# Patient Record
Sex: Female | Born: 1950
Health system: Southern US, Community
[De-identification: ages and names within clinical notes are randomized; demographics above are authoritative.]

## PROBLEM LIST (undated history)

## (undated) DIAGNOSIS — W11XXXA Fall on and from ladder, initial encounter: Secondary | ICD-10-CM

## (undated) DIAGNOSIS — M1990 Unspecified osteoarthritis, unspecified site: Secondary | ICD-10-CM

## (undated) DIAGNOSIS — M4184 Other forms of scoliosis, thoracic region: Secondary | ICD-10-CM

## (undated) DIAGNOSIS — I10 Essential (primary) hypertension: Secondary | ICD-10-CM

## (undated) DIAGNOSIS — C801 Malignant (primary) neoplasm, unspecified: Secondary | ICD-10-CM

## (undated) DIAGNOSIS — R112 Nausea with vomiting, unspecified: Secondary | ICD-10-CM

## (undated) DIAGNOSIS — E785 Hyperlipidemia, unspecified: Secondary | ICD-10-CM

## (undated) DIAGNOSIS — N189 Chronic kidney disease, unspecified: Secondary | ICD-10-CM

## (undated) DIAGNOSIS — G4700 Insomnia, unspecified: Secondary | ICD-10-CM

## (undated) DIAGNOSIS — K5792 Diverticulitis of intestine, part unspecified, without perforation or abscess without bleeding: Secondary | ICD-10-CM

## (undated) DIAGNOSIS — F419 Anxiety disorder, unspecified: Secondary | ICD-10-CM

## (undated) DIAGNOSIS — K5790 Diverticulosis of intestine, part unspecified, without perforation or abscess without bleeding: Secondary | ICD-10-CM

## (undated) DIAGNOSIS — K5900 Constipation, unspecified: Secondary | ICD-10-CM

## (undated) DIAGNOSIS — M4154 Other secondary scoliosis, thoracic region: Secondary | ICD-10-CM

## (undated) DIAGNOSIS — Z87442 Personal history of urinary calculi: Secondary | ICD-10-CM

## (undated) DIAGNOSIS — E039 Hypothyroidism, unspecified: Secondary | ICD-10-CM

## (undated) DIAGNOSIS — Z9889 Other specified postprocedural states: Secondary | ICD-10-CM

## (undated) DIAGNOSIS — M8580 Other specified disorders of bone density and structure, unspecified site: Secondary | ICD-10-CM

## (undated) DIAGNOSIS — E7800 Pure hypercholesterolemia, unspecified: Secondary | ICD-10-CM

## (undated) HISTORY — PX: ELBOW FRACTURE SURGERY: SHX616

## (undated) HISTORY — DX: Unspecified osteoarthritis, unspecified site: M19.90

## (undated) HISTORY — PX: TUBAL LIGATION: SHX77

## (undated) HISTORY — PX: ABDOMINAL HYSTERECTOMY: SHX81

## (undated) HISTORY — PX: APPENDECTOMY: SHX54

## (undated) HISTORY — PX: OTHER SURGICAL HISTORY: SHX169

## (undated) HISTORY — PX: KNEE SURGERY: SHX244

## (undated) HISTORY — PX: FRACTURE SURGERY: SHX138

## (undated) HISTORY — DX: Hyperlipidemia, unspecified: E78.5

## (undated) HISTORY — PX: TONSILLECTOMY: SUR1361

## (undated) HISTORY — PX: EYE SURGERY: SHX253

---

## 1985-09-21 DIAGNOSIS — Z87442 Personal history of urinary calculi: Secondary | ICD-10-CM

## 1985-09-21 HISTORY — DX: Personal history of urinary calculi: Z87.442

## 1990-09-21 HISTORY — PX: CHOLECYSTECTOMY: SHX55

## 1995-09-22 HISTORY — PX: BILATERAL SALPINGECTOMY: SHX5743

## 2000-10-05 ENCOUNTER — Encounter: Payer: Self-pay | Admitting: Emergency Medicine

## 2000-10-05 ENCOUNTER — Emergency Department (HOSPITAL_COMMUNITY): Admission: EM | Admit: 2000-10-05 | Discharge: 2000-10-05 | Payer: Self-pay | Admitting: Emergency Medicine

## 2001-04-01 ENCOUNTER — Encounter: Admission: RE | Admit: 2001-04-01 | Discharge: 2001-04-01 | Payer: Self-pay

## 2001-04-06 ENCOUNTER — Emergency Department (HOSPITAL_COMMUNITY): Admission: EM | Admit: 2001-04-06 | Discharge: 2001-04-06 | Payer: Self-pay | Admitting: Emergency Medicine

## 2001-04-06 ENCOUNTER — Encounter: Payer: Self-pay | Admitting: Surgery

## 2001-04-11 ENCOUNTER — Encounter: Payer: Self-pay | Admitting: Surgery

## 2001-04-11 ENCOUNTER — Encounter: Admission: RE | Admit: 2001-04-11 | Discharge: 2001-04-11 | Payer: Self-pay | Admitting: Surgery

## 2001-12-29 ENCOUNTER — Ambulatory Visit (HOSPITAL_COMMUNITY): Admission: RE | Admit: 2001-12-29 | Discharge: 2001-12-29 | Payer: Self-pay | Admitting: Gastroenterology

## 2001-12-29 ENCOUNTER — Encounter (INDEPENDENT_AMBULATORY_CARE_PROVIDER_SITE_OTHER): Payer: Self-pay | Admitting: Specialist

## 2003-09-22 DIAGNOSIS — W11XXXA Fall on and from ladder, initial encounter: Secondary | ICD-10-CM

## 2003-09-22 HISTORY — PX: ELBOW FRACTURE SURGERY: SHX616

## 2003-09-22 HISTORY — DX: Fall on and from ladder, initial encounter: W11.XXXA

## 2004-12-23 ENCOUNTER — Ambulatory Visit: Payer: Self-pay

## 2005-02-16 ENCOUNTER — Ambulatory Visit: Payer: Self-pay | Admitting: Unknown Physician Specialty

## 2005-03-20 ENCOUNTER — Encounter: Admission: RE | Admit: 2005-03-20 | Discharge: 2005-03-20 | Payer: Self-pay | Admitting: Gastroenterology

## 2006-06-18 ENCOUNTER — Emergency Department (HOSPITAL_COMMUNITY): Admission: EM | Admit: 2006-06-18 | Discharge: 2006-06-18 | Payer: Self-pay | Admitting: Emergency Medicine

## 2007-01-21 ENCOUNTER — Encounter: Admission: RE | Admit: 2007-01-21 | Discharge: 2007-01-21 | Payer: Self-pay | Admitting: Gastroenterology

## 2007-04-26 ENCOUNTER — Emergency Department (HOSPITAL_COMMUNITY): Admission: EM | Admit: 2007-04-26 | Discharge: 2007-04-26 | Payer: Self-pay | Admitting: Emergency Medicine

## 2010-01-03 ENCOUNTER — Observation Stay (HOSPITAL_COMMUNITY): Admission: EM | Admit: 2010-01-03 | Discharge: 2010-01-05 | Payer: Self-pay | Admitting: Emergency Medicine

## 2010-10-12 ENCOUNTER — Encounter: Payer: Self-pay | Admitting: Gastroenterology

## 2010-12-09 LAB — CBC
HCT: 39.3 % (ref 36.0–46.0)
Hemoglobin: 13.6 g/dL (ref 12.0–15.0)
MCHC: 35.4 g/dL (ref 30.0–36.0)
MCV: 92.1 fL (ref 78.0–100.0)
Platelets: 252 10*3/uL (ref 150–400)
RBC: 4.15 MIL/uL (ref 3.87–5.11)
RDW: 11.7 % (ref 11.5–15.5)
WBC: 8.4 10*3/uL (ref 4.0–10.5)

## 2010-12-09 LAB — BASIC METABOLIC PANEL
CO2: 25 mEq/L (ref 19–32)
Chloride: 109 mEq/L (ref 96–112)
Glucose, Bld: 116 mg/dL — ABNORMAL HIGH (ref 70–99)
Sodium: 140 mEq/L (ref 135–145)

## 2010-12-09 LAB — SAMPLE TO BLOOD BANK

## 2010-12-09 LAB — DIFFERENTIAL
Basophils Absolute: 0 10*3/uL (ref 0.0–0.1)
Basophils Relative: 0 % (ref 0–1)
Eosinophils Absolute: 0 10*3/uL (ref 0.0–0.7)
Eosinophils Relative: 0 % (ref 0–5)
Lymphs Abs: 2.2 10*3/uL (ref 0.7–4.0)
Neutrophils Relative %: 66 % (ref 43–77)

## 2010-12-09 LAB — COMPREHENSIVE METABOLIC PANEL
ALT: 38 U/L — ABNORMAL HIGH (ref 0–35)
Albumin: 4 g/dL (ref 3.5–5.2)
Alkaline Phosphatase: 55 U/L (ref 39–117)
Chloride: 105 mEq/L (ref 96–112)
Glucose, Bld: 147 mg/dL — ABNORMAL HIGH (ref 70–99)
Potassium: 3.3 mEq/L — ABNORMAL LOW (ref 3.5–5.1)
Sodium: 138 mEq/L (ref 135–145)
Total Bilirubin: 0.9 mg/dL (ref 0.3–1.2)
Total Protein: 6.4 g/dL (ref 6.0–8.3)

## 2010-12-09 LAB — POCT I-STAT, CHEM 8
Creatinine, Ser: 0.4 mg/dL (ref 0.4–1.2)
HCT: 41 % (ref 36.0–46.0)
Hemoglobin: 13.9 g/dL (ref 12.0–15.0)
Potassium: 3.4 mEq/L — ABNORMAL LOW (ref 3.5–5.1)
Sodium: 137 mEq/L (ref 135–145)
TCO2: 23 mmol/L (ref 0–100)

## 2010-12-09 LAB — TSH: TSH: 5.997 u[IU]/mL — ABNORMAL HIGH (ref 0.350–4.500)

## 2010-12-09 LAB — HEPATIC FUNCTION PANEL
Albumin: 4.4 g/dL (ref 3.5–5.2)
Indirect Bilirubin: 0.7 mg/dL (ref 0.3–0.9)
Total Protein: 6.8 g/dL (ref 6.0–8.3)

## 2010-12-09 LAB — APTT: aPTT: 28 seconds (ref 24–37)

## 2010-12-09 LAB — PROTIME-INR
INR: 1.05 (ref 0.00–1.49)
Prothrombin Time: 13.6 seconds (ref 11.6–15.2)

## 2011-02-06 NOTE — H&P (Signed)
[facility]  Patient:    Gloria Bell, Gloria Bell                      MRN: [id number] Adm. Date:  [id number] Disc. Date: [id number] Attending:  Tobey Bride                         History and Physical  ACCOUNT NO. [id number].  CHIEF COMPLAINT:  Abdominal pain.  HISTORY OF PRESENT ILLNESS:  This patient is a 60 year old woman who presented to the emergency room after her primary physician, Dr. Fayrene Fearing in [place], had spoken with Dr. Maple Hudson at our office.  The patient has had a chief complaint of abdominal pain, which has been going on now about two months, although it is gradually getting worse and progressive and has been very steady and constant.  It is located epigastrically right at the subxiphoid, goes around to the right upper quadrant and around to her back.  She has had occasional minimal nausea but has had none significant today.  She has had no vomiting, fever, or chills. She has had no change in her bowel habits.  She has thrown up no blood and has had no blood in her stools.  Her weight has been stable, and she has had no other recent GI symptoms.  Approximately 10 years ago she had a laparoscopic cholecystectomy done when she lived in [place].  According to her, this was done because her gallbladder was not functioning and not because she had stones.  She was not having symptoms at that time that she remembers that were similar to this.  She has done well since that surgery was done regarding her GI tract.  She was placed on some Prevacid by Dr. Fayrene Fearing and Carafate but really has not noticed much improvement.  PAST MEDICAL HISTORY:  The patient says she is otherwise in generally good health.  ALLERGIES:  She is sensitive to CODEINE, ASPIRIN, TETRACYCLINE, BIAXIN, and SULFA.  PAST SURGICAL HISTORY:  Hysterectomy and some ovarian surgeries, tubal ligation, and the cholecystectomy.  SOCIAL HISTORY:  She does not smoke nor drink.  REVIEW OF  SYSTEMS:  Essentially negative, noncontributory to the current illness.  PHYSICAL EXAMINATION:  VITAL SIGNS:  Unremarkable and noted on the emergency room sheet, not redictated here.  GENERAL:  The patient is healthy and in no acute distress.  HEENT:  Head is normocephalic.  Eyes nonicteric.  Pupils round and regular.  NECK:  Supple.  No masses or thyromegaly.  CHEST:  Lungs clear to auscultation.  CARDIAC:  Regular with no murmurs, rubs, or gallops.  ABDOMEN:  Soft and not distended, not particularly tender.  No rebound, no guarding.  PELVIC, RECTAL:  Not done.  EXTREMITIES:  No cyanosis or edema.  LABORATORY DATA:  White count of 7100, hemoglobin 13.  CMET is completely normal.  Amylase and lipase are both normal.  IMPRESSION:  A two-month history of gradually increasing epigastric right upper quadrant pain of uncertain etiology.  PLAN:  I think this needs further workup, but she does not have an acute illness that I think will necessitate hospitalization.  The next diagnostic studies should include, I think, a CT scan to rule out some occult pancreatic process, although her ultrasound last week was unremarkable, as well as perhaps either an upper GI or an upper endoscopy.  We will discuss this with her and make further plans. DD:  04/06/01 TD:  04/07/01  Job: [id number]

## 2011-02-06 NOTE — Op Note (Signed)
[facility]  Patient:    Gloria Bell, SPAGNOLO Visit Number: [id number] MRN: [id number]          Service Type: END Location: ENDO Attending Physician:  Orland Mustard Dictated by:   Llana Aliment. Randa Evens, M.D. Proc. Date: 12/29/01 Admit Date:  12/29/2001   CC:         Kizzie Furnish, M.D.  Currie Paris, M.D.   Operative Report  DATE OF BIRTH:  1950/09/25  PROCEDURE PERFORMED:  Colonoscopy and polypectomy.  ENDOSCOPIST:  Llana Aliment. Randa Evens, M.D.  MEDICATIONS USED:  Fentanyl 100 mcg, Versed 10 mg IV.  INSTRUMENT:  Olympus pediatric colonoscope.  INDICATIONS:  Change in bowel habits, left lower quadrant pain, change in stool caliber in a woman with a strong family history of colon polyps.  DESCRIPTION OF PROCEDURE:  The procedure had been explained to the patient and consent obtained.  With the patient in the left lateral decubitus position, the Olympus pediatric colonoscope was inserted and advanced under direct visualization.  The prep was excellent and we were able to advance to the cecum without difficulty.  The ileocecal valve and appendiceal orifice were seen.  The scope was withdrawn.  The cecum, ascending colon, hepatic flexure, transverse colon, splenic flexure, descending colon were seen.  A 4 to 5 mm sessile polyp was encountered in the descending colon and removed with a snare and sucked through the scope.  We felt that we recovered it but upon sure it wasnt sure if this was a polyp or fragments of stool.  No other polyps were seen throughout the entire colon.  The sigmoid colon revealed no polyps and no evidence of diverticular disease.  Scope withdrawn down to the rectum.  The rectum was free of polyps.  ASSESSMENT: 1. Descending colon polyp removed but lost.  Due to her family history, I    think we will have to assume that this is adenomatous if the fragment have    proved not to be polyp. 2. Left lower quadrant  pain without any obvious mechanical abnormality.  PLAN:  Will recommend a three-year repeat colonoscopy.  Will continue the patient on Miralax and see back in the office in six to eight weeks. Dictated by:   Llana Aliment. Randa Evens, M.D. Attending Physician:  Orland Mustard DD:  12/29/01 TD:  12/30/01 Job: [id number]

## 2011-07-06 LAB — COMPREHENSIVE METABOLIC PANEL
BUN: 12
CO2: 27
Calcium: 9.4
Creatinine, Ser: 0.65
GFR calc non Af Amer: >60
Glucose, Bld: 125 — ABNORMAL HIGH

## 2011-07-06 LAB — DIFFERENTIAL
Basophils Absolute: 0
Eosinophils Relative: 0
Lymphocytes Relative: 33
Neutro Abs: 3.5
Neutrophils Relative %: 60

## 2011-07-06 LAB — D-DIMER, QUANTITATIVE

## 2011-07-06 LAB — CBC
HCT: 38.5
MCHC: 34.8
MCV: 89.6
RBC: 4.3
WBC: 5.8

## 2011-07-06 LAB — POCT CARDIAC MARKERS
CKMB, poc: <1 — ABNORMAL LOW
Myoglobin, poc: 31.1
Operator id: 4661

## 2012-01-05 ENCOUNTER — Inpatient Hospital Stay (HOSPITAL_COMMUNITY)
Admission: EM | Admit: 2012-01-05 | Discharge: 2012-01-08 | DRG: 551 | Disposition: A | Discharge status: Home / Self Care | Payer: BC Managed Care – PPO | Source: Ambulatory Visit | Attending: Internal Medicine | Admitting: Internal Medicine

## 2012-01-05 ENCOUNTER — Emergency Department (HOSPITAL_COMMUNITY): Payer: BC Managed Care – PPO

## 2012-01-05 ENCOUNTER — Encounter (HOSPITAL_COMMUNITY): Payer: Self-pay

## 2012-01-05 DIAGNOSIS — N390 Urinary tract infection, site not specified: Secondary | ICD-10-CM | POA: Diagnosis present

## 2012-01-05 DIAGNOSIS — K651 Peritoneal abscess: Secondary | ICD-10-CM | POA: Diagnosis present

## 2012-01-05 DIAGNOSIS — K5732 Diverticulitis of large intestine without perforation or abscess without bleeding: Principal | ICD-10-CM | POA: Diagnosis present

## 2012-01-05 DIAGNOSIS — K5792 Diverticulitis of intestine, part unspecified, without perforation or abscess without bleeding: Secondary | ICD-10-CM | POA: Diagnosis present

## 2012-01-05 DIAGNOSIS — I10 Essential (primary) hypertension: Secondary | ICD-10-CM | POA: Diagnosis present

## 2012-01-05 HISTORY — DX: Essential (primary) hypertension: I10

## 2012-01-05 LAB — URINALYSIS, ROUTINE W REFLEX MICROSCOPIC
Glucose, UA: NEGATIVE mg/dL
Nitrite: NEGATIVE
Protein, ur: NEGATIVE mg/dL
Urobilinogen, UA: 1 mg/dL (ref 0.0–1.0)

## 2012-01-05 LAB — COMPREHENSIVE METABOLIC PANEL
AST: 17 U/L (ref 0–37)
CO2: 27 mEq/L (ref 19–32)
Calcium: 9.3 mg/dL (ref 8.4–10.5)
Creatinine, Ser: 0.68 mg/dL (ref 0.50–1.10)
GFR calc Af Amer: >90 mL/min (ref >90)
GFR calc non Af Amer: >90 mL/min (ref >90)

## 2012-01-05 LAB — CBC
HCT: 38.9 % (ref 36.0–46.0)
MCH: 31.3 pg (ref 26.0–34.0)
MCHC: 34.7 g/dL (ref 30.0–36.0)
MCV: 90.3 fL (ref 78.0–100.0)
RDW: 11.9 % (ref 11.5–15.5)
WBC: 10 10*3/uL (ref 4.0–10.5)

## 2012-01-05 LAB — DIFFERENTIAL
Basophils Absolute: 0 10*3/uL (ref 0.0–0.1)
Eosinophils Relative: 0 % (ref 0–5)
Lymphocytes Relative: 17 % (ref 12–46)
Monocytes Absolute: 1 10*3/uL (ref 0.1–1.0)

## 2012-01-05 LAB — URINE MICROSCOPIC-ADD ON

## 2012-01-05 MED ORDER — METOPROLOL SUCCINATE ER 25 MG PO TB24
25.0000 mg | ORAL_TABLET | Freq: Every day | ORAL | Status: DC
  Filled 2012-01-05 (×2): qty 1

## 2012-01-05 MED ORDER — FENTANYL CITRATE 0.05 MG/ML IJ SOLN: 50.0000 ug | Freq: Once | INTRAMUSCULAR | Status: DC

## 2012-01-05 MED ORDER — METRONIDAZOLE IN NACL 5-0.79 MG/ML-% IV SOLN
500.0000 mg | Freq: Three times a day (TID) | INTRAVENOUS | Status: DC
Start: 2012-01-05 — End: 2012-01-08
  Administered 2012-01-06 – 2012-01-08 (×6): 500 mg via INTRAVENOUS
  Filled 2012-01-05 (×9): qty 100

## 2012-01-05 MED ORDER — IOHEXOL 300 MG/ML  SOLN
80.0000 mL | Freq: Once | INTRAMUSCULAR | Status: AC | PRN
  Administered 2012-01-05: 80 mL via INTRAVENOUS

## 2012-01-05 MED ORDER — SODIUM CHLORIDE 0.9 % IV BOLUS (SEPSIS)
500.0000 mL | Freq: Once | INTRAVENOUS | Status: AC
  Administered 2012-01-05: 16:00:00 via INTRAVENOUS

## 2012-01-05 MED ORDER — SODIUM CHLORIDE 0.9 % IV SOLN
INTRAVENOUS | Status: DC
  Administered 2012-01-05 – 2012-01-06 (×3): via INTRAVENOUS
  Administered 2012-01-07: 1000 mL via INTRAVENOUS

## 2012-01-05 MED ORDER — ACETAMINOPHEN 650 MG RE SUPP: 650.0000 mg | Freq: Four times a day (QID) | RECTAL | Status: DC | PRN

## 2012-01-05 MED ORDER — CIPROFLOXACIN IN D5W 400 MG/200ML IV SOLN
400.0000 mg | Freq: Once | INTRAVENOUS | Status: DC
  Filled 2012-01-05: qty 200

## 2012-01-05 MED ORDER — PANTOPRAZOLE SODIUM 40 MG IV SOLR
40.0000 mg | Freq: Two times a day (BID) | INTRAVENOUS | Status: DC
  Administered 2012-01-05 – 2012-01-08 (×6): 40 mg via INTRAVENOUS
  Filled 2012-01-05 (×7): qty 40

## 2012-01-05 MED ORDER — FENTANYL CITRATE 0.05 MG/ML IJ SOLN
25.0000 ug | INTRAMUSCULAR | Status: DC | PRN
  Administered 2012-01-05 – 2012-01-06 (×6): 25 ug via INTRAVENOUS
  Filled 2012-01-05 (×6): qty 2

## 2012-01-05 MED ORDER — SODIUM CHLORIDE 0.9 % IV BOLUS (SEPSIS)
1000.0000 mL | Freq: Once | INTRAVENOUS | Status: AC
  Administered 2012-01-05: 1000 mL via INTRAVENOUS

## 2012-01-05 MED ORDER — ONDANSETRON HCL 4 MG/2ML IJ SOLN
4.0000 mg | Freq: Three times a day (TID) | INTRAMUSCULAR | Status: DC | PRN
  Administered 2012-01-05 – 2012-01-08 (×5): 4 mg via INTRAVENOUS
  Filled 2012-01-05 (×6): qty 2

## 2012-01-05 MED ORDER — WHITE PETROLATUM GEL
Status: AC
  Administered 2012-01-05: 1
  Filled 2012-01-05: qty 5

## 2012-01-05 MED ORDER — FENTANYL CITRATE 0.05 MG/ML IJ SOLN
50.0000 ug | Freq: Once | INTRAMUSCULAR | Status: AC
  Administered 2012-01-05: 50 ug via INTRAVENOUS
  Filled 2012-01-05: qty 2

## 2012-01-05 MED ORDER — ONDANSETRON HCL 4 MG/2ML IJ SOLN
4.0000 mg | Freq: Once | INTRAMUSCULAR | Status: AC
  Administered 2012-01-05: 4 mg via INTRAVENOUS
  Filled 2012-01-05: qty 2

## 2012-01-05 MED ORDER — ACETAMINOPHEN 325 MG PO TABS
650.0000 mg | ORAL_TABLET | Freq: Four times a day (QID) | ORAL | Status: DC | PRN
  Administered 2012-01-06 – 2012-01-08 (×5): 650 mg via ORAL
  Filled 2012-01-05 (×5): qty 2

## 2012-01-05 MED ORDER — METRONIDAZOLE IN NACL 5-0.79 MG/ML-% IV SOLN: 500.0000 mg | Freq: Once | INTRAVENOUS | Status: DC

## 2012-01-05 MED ORDER — CIPROFLOXACIN IN D5W 400 MG/200ML IV SOLN
400.0000 mg | Freq: Two times a day (BID) | INTRAVENOUS | Status: DC
  Administered 2012-01-05 – 2012-01-08 (×6): 400 mg via INTRAVENOUS
  Filled 2012-01-05 (×7): qty 200

## 2012-01-05 NOTE — ED Notes (Signed)
[id number] Ready 

## 2012-01-05 NOTE — ED Provider Notes (Signed)
History     CSN: [id number]  Arrival date & time 01/05/12  1158   First MD Initiated Contact with Patient 01/05/12 1218     1:05 PM HPI Pt reports 5 days ago she began to have LLQ pain. Pain radiates to her supragastric region. Reports she has had multiple abdominal problems. But this is the most painful. Denies fever, N/V/D, Hematochezia, hematuria. Patient is a 61 y.o. female presenting with abdominal pain.  Abdominal Pain The primary symptoms of the illness include abdominal pain. The primary symptoms of the illness do not include fever, shortness of breath, nausea, vomiting, diarrhea, dysuria or vaginal discharge. The onset of the illness was gradual. The problem has been gradually worsening.  The abdominal pain has been gradually worsening since its onset. The abdominal pain is located in the LLQ. The abdominal pain radiates to the suprapubic region. The abdominal pain is relieved by nothing. The abdominal pain is exacerbated by movement (palpation).  Symptoms associated with the illness do not include chills, constipation, urgency, hematuria, frequency or back pain.    Past Medical History  Diagnosis Date  . Hypertension     Past Surgical History  Procedure Date  . Elbow fracture surgery   . Knee surgery   . Other surgical history     had to be on percocet for a year and it slowed down her "gut"   . Cholecystectomy   . Abdominal hysterectomy   . Appendectomy     No family history on file.  History  Substance Use Topics  . Smoking status: Never Smoker   . Smokeless tobacco: Not on file  . Alcohol Use: No    OB History    Grav Para Term Preterm Abortions TAB SAB Ect Mult Living                  Review of Systems  Constitutional: Negative for fever and chills.  Respiratory: Negative for shortness of breath.   Cardiovascular: Negative for chest pain.  Gastrointestinal: Positive for abdominal pain and rectal pain. Negative for nausea, vomiting, diarrhea and  constipation.  Genitourinary: Negative for dysuria, urgency, frequency, hematuria, flank pain, vaginal discharge and vaginal pain.  Musculoskeletal: Negative for back pain.  All other systems reviewed and are negative.    Allergies  Ampicillin; Keflex; Sulfa antibiotics; and Vicodin  Home Medications  No current outpatient prescriptions on file.  BP 136/78  Pulse 105  Temp(Src) 99 F (37.2 C) (Oral)  Resp 19  SpO2 96%  Physical Exam  Vitals reviewed. Constitutional: She is oriented to person, place, and time. Vital signs are normal. She appears well-developed and well-nourished.  HENT:  Head: Normocephalic and atraumatic.  Eyes: Conjunctivae are normal. Pupils are equal, round, and reactive to light.  Neck: Normal range of motion. Neck supple.  Cardiovascular: Normal rate, regular rhythm and normal heart sounds.  Exam reveals no friction rub.   No murmur heard. Pulmonary/Chest: Effort normal and breath sounds normal. She has no wheezes. She has no rhonchi. She has no rales. She exhibits no tenderness.  Abdominal: Soft. Bowel sounds are normal. She exhibits no distension and no mass. There is no hepatosplenomegaly. There is no rigidity, no rebound, no guarding, no tenderness at McBurney's point and negative Murphy's sign.    Musculoskeletal: Normal range of motion.  Neurological: She is alert and oriented to person, place, and time. Coordination normal.  Skin: Skin is warm and dry. No rash noted. No erythema. No pallor.    ED  Course  Procedures  .ED ECG REPORT   Date: 01/05/2012  EKG Time: 3:17 PM  Rate: 103  Rhythm: sinus tachycardia,  No significant changes since 04/26/2007  Axis: nml  Intervals:none  ST&T Change: none   MDM    Will place patient in CDU pending CT and labs. Discussed with Pascal Lux [facility] PA-[facility] CDU  Thomasene Lot, PA-C 01/05/12 1405  Thomasene Lot, PA-C 01/05/12 1547

## 2012-01-05 NOTE — ED Notes (Signed)
Pt needs to see dr Randa Evens sts rectum and colon pain. Since Thursday, sts the entire pelvis is painful.

## 2012-01-05 NOTE — ED Notes (Signed)
Pt finished drinking PO contrast.

## 2012-01-05 NOTE — ED Notes (Signed)
Patient remains on monitor and sats of 98% on RA.

## 2012-01-05 NOTE — ED Notes (Signed)
Spoke with someone at Dr. Randa Evens office concerning pt statement that he would come see her in the emergency room d/t her extensive history.  Staff member noted that the patient hadn't been to the office since 2010 and no new notes had been entered into her medical record.

## 2012-01-05 NOTE — Consult Note (Signed)
Reason for Consult:  Diverticulitis with abscess Referring Physician: Lauraine Rinne, MD  Gloria Bell is an 61 y.o. female.  HPI: Pt is a 61 year old female with around 5 days of worsening LLQ pain.  She had known diverticuli on her last colonoscopy by Dr. Randa Evens, but has not ever had an episode of inflammation before.  She does not eat seeds or nuts.  She takes Miralax and drinks around 80 oz water/day to avoid constipation.  She denies fever/chills.  She denies nausea or vomiting.  She has not had diarrhea recently.  She has had many problems with hemorrhoids in the past, and they bleed significantly yearly.    Past Medical History  Diagnosis Date  . Hypertension     Past Surgical History  Procedure Date  . Elbow fracture surgery   . Knee surgery   . Other surgical history     had to be on percocet for a year and it slowed down her "gut"   . Cholecystectomy   . Abdominal hysterectomy   . Appendectomy     No family history on file.  Social History:  reports that she has never smoked. She does not have any smokeless tobacco history on file. She reports that she does not drink alcohol or use illicit drugs.  Allergies:  Allergies  Allergen Reactions  . Ampicillin Nausea And Vomiting  . Codeine Nausea And Vomiting  . Keflex Nausea And Vomiting  . Sulfa Antibiotics Nausea And Vomiting  . Vicodin (Hydrocodone-Acetaminophen) Nausea And Vomiting    Medications:  MedicationsLong-Term  Prescriptions Show Facility-Administered Medications    acetaminophen (TYLENOL) 500 MG tablet   losartan (COZAAR) 50 MG tablet   metoprolol succinate (TOPROL-XL) 25 MG 24 hr tablet   polyethylene glycol (MIRALAX / GLYCOLAX) packet         Results for orders placed during the hospital encounter of 01/05/12 (from the past 48 hour(s))  URINALYSIS, ROUTINE W REFLEX MICROSCOPIC     Status: Abnormal   Collection Time   01/05/12  1:19 PM      Component Value Range Comment   Color, Urine YELLOW   YELLOW     APPearance CLEAR  CLEAR     Specific Gravity, Urine 1.021  1.005 - 1.030     pH 6.5  5.0 - 8.0     Glucose, UA NEGATIVE  NEGATIVE (mg/dL)    Hgb urine dipstick NEGATIVE  NEGATIVE     Bilirubin Urine NEGATIVE  NEGATIVE     Ketones, ur NEGATIVE  NEGATIVE (mg/dL)    Protein, ur NEGATIVE  NEGATIVE (mg/dL)    Urobilinogen, UA 1.0  0.0 - 1.0 (mg/dL)    Nitrite NEGATIVE  NEGATIVE     Leukocytes, UA MODERATE (*) NEGATIVE    URINE MICROSCOPIC-ADD ON     Status: Abnormal   Collection Time   01/05/12  1:19 PM      Component Value Range Comment   Squamous Epithelial / LPF FEW (*) RARE     WBC, UA 3-6  <3 (WBC/hpf)    Bacteria, UA RARE  RARE     Urine-Other MUCOUS PRESENT     CBC     Status: Normal   Collection Time   01/05/12  1:22 PM      Component Value Range Comment   WBC 10.0  4.0 - 10.5 (K/uL)    RBC 4.31  3.87 - 5.11 (MIL/uL)    Hemoglobin 13.5  12.0 - 15.0 (g/dL)    HCT  38.9  36.0 - 46.0 (%)    MCV 90.3  78.0 - 100.0 (fL)    MCH 31.3  26.0 - 34.0 (pg)    MCHC 34.7  30.0 - 36.0 (g/dL)    RDW 81.1  91.4 - 78.2 (%)    Platelets 268  150 - 400 (K/uL)   DIFFERENTIAL     Status: Normal   Collection Time   01/05/12  1:22 PM      Component Value Range Comment   Neutrophils Relative 73  43 - 77 (%)    Neutro Abs 7.3  1.7 - 7.7 (K/uL)    Lymphocytes Relative 17  12 - 46 (%)    Lymphs Abs 1.7  0.7 - 4.0 (K/uL)    Monocytes Relative 10  3 - 12 (%)    Monocytes Absolute 1.0  0.1 - 1.0 (K/uL)    Eosinophils Relative 0  0 - 5 (%)    Eosinophils Absolute 0.0  0.0 - 0.7 (K/uL)    Basophils Relative 0  0 - 1 (%)    Basophils Absolute 0.0  0.0 - 0.1 (K/uL)   COMPREHENSIVE METABOLIC PANEL     Status: Abnormal   Collection Time   01/05/12  1:22 PM      Component Value Range Comment   Sodium 136  135 - 145 (mEq/L)    Potassium 3.6  3.5 - 5.1 (mEq/L)    Chloride 98  96 - 112 (mEq/L)    CO2 27  19 - 32 (mEq/L)    Glucose, Bld 110 (*) 70 - 99 (mg/dL)    BUN 14  6 - 23 (mg/dL)     Creatinine, Ser 9.56  0.50 - 1.10 (mg/dL)    Calcium 9.3  8.4 - 10.5 (mg/dL)    Total Protein 7.2  6.0 - 8.3 (g/dL)    Albumin 4.1  3.5 - 5.2 (g/dL)    AST 17  0 - 37 (U/L)    ALT 28  0 - 35 (U/L)    Alkaline Phosphatase 80  39 - 117 (U/L)    Total Bilirubin 0.4  0.3 - 1.2 (mg/dL)    GFR calc non Af Amer >90  >90 (mL/min)    GFR calc Af Amer >90  >90 (mL/min)   LIPASE, BLOOD     Status: Normal   Collection Time   01/05/12  1:22 PM      Component Value Range Comment   Lipase 21  11 - 59 (U/L)     Ct Abdomen Pelvis W Contrast  01/05/2012  *RADIOLOGY REPORT*  Clinical Data: Abdominal pain.  CT ABDOMEN AND PELVIS WITH CONTRAST  Technique:  Multidetector CT imaging of the abdomen and pelvis was performed following the standard protocol during bolus administration of intravenous contrast.  Contrast: 80mL OMNIPAQUE IOHEXOL 300 MG/ML  SOLN  Comparison: 06/18/2006  Findings: Scarring or atelectasis in the lung bases.  Heart is upper limits normal in size.  No effusions.  Prior cholecystectomy.  Liver, spleen, pancreas, adrenals, kidneys are unremarkable.  There is descending colonic and sigmoid diverticulosis.  Inflammatory changes noted around the sigmoid colon compatible with active diverticulitis.  There is a fluid collection noted possibly within the wall of the sigmoid colon. While this could represent a fluid-filled diverticulum, I cannot exclude an intramural abscess measuring 2 cm.  There is adjacent secondary wall thickening in the adjacent bladder.  Small bowel is decompressed.  No free fluid or free air.  Shotty retroperitoneal lymph nodes,  none pathologically enlarged.  No acute bony abnormality.  IMPRESSION: Changes of sigmoid diverticulitis.  Possible 2 cm abscess within the wall of the sigmoid colon.  Superior wall thickening in the adjacent bladder wall.  Prior cholecystectomy.  Original Report Authenticated By: Cyndie Chime, M.D.    Review of Systems  Constitutional: Negative for  fever, chills, weight loss, malaise/fatigue and diaphoresis.  HENT: Negative.   Eyes: Negative.   Respiratory: Negative.   Cardiovascular: Negative.   Gastrointestinal: Positive for abdominal pain, constipation and blood in stool (history of hemorrhoids). Negative for nausea, vomiting and diarrhea.  Genitourinary: Positive for dysuria.  Musculoskeletal: Negative.   Skin: Negative for itching and rash.  Neurological: Negative.  Negative for weakness.  Endo/Heme/Allergies: Negative.   Psychiatric/Behavioral: Negative.    Blood pressure 134/75, pulse 94, temperature 98.6 F (37 C), temperature source Oral, resp. rate 24, SpO2 100.00%. Physical Exam  Constitutional: She is oriented to person, place, and time. She appears well-developed and well-nourished. No distress.  HENT:  Head: Normocephalic and atraumatic.  Mouth/Throat: No oropharyngeal exudate.  Eyes: Conjunctivae are normal. Pupils are equal, round, and reactive to light. No scleral icterus.  Neck: Normal range of motion. Neck supple. No tracheal deviation present. No thyromegaly present.  Cardiovascular: Normal rate, regular rhythm, normal heart sounds and intact distal pulses.   Respiratory: Effort normal and breath sounds normal. No respiratory distress. She exhibits no tenderness.  GI: Soft. Bowel sounds are normal. She exhibits no distension and no mass. There is tenderness (Left lower quadrant). There is guarding (voluntary). There is no rebound.  Musculoskeletal: Normal range of motion. She exhibits no edema and no tenderness.  Lymphadenopathy:    She has no cervical adenopathy.  Neurological: She is alert and oriented to person, place, and time. No cranial nerve deficit. Coordination normal.  Skin: Skin is warm and dry. No rash noted. She is not diaphoretic. No erythema. No pallor.  Psychiatric: She has a normal mood and affect. Her behavior is normal. Judgment and thought content normal.     Assessment/Plan:  Diverticulitis with abscess Plan IV antibiotics Bowel rest IV fluids. This is first episode of diverticulitis that pt is aware of Hope to be able to resolve without acute surgery.  Lauriel Helin 01/05/2012, 8:07 PM

## 2012-01-05 NOTE — ED Notes (Signed)
Patient refused cipro and flagyl and states she is allergic to these medications and does not know what type reaction she has to either of these medications.

## 2012-01-05 NOTE — Progress Notes (Signed)
Pt refuses to take her flagyl even after the nurse and the pharmacist tried to explain to her the importance of taking the antibiotics. Pt states she'll take her cipro but will not take the flagyl.

## 2012-01-05 NOTE — ED Notes (Signed)
Dr. Randa Evens paged and spoke to him concerning pt and he was not aware of anything therefore she needs to be evaluated by ER.

## 2012-01-05 NOTE — H&P (Signed)
Hospital Admission Note Date: 01/05/2012  PCP: Conchita Paris, MD, MD  Chief Complaint: Abdominal pain.   History of Present Illness: 60 year old with PMH significant for diverticulosis, HTN presents to ED complaining of worsening left lower quadrant abdominal pain that started 6 days prior to admission, describe pain sharp "rock, flame, hot". She relates pain is 10/10 in intensity. She denies nausea, vomiting, diarrhea. Had BM the day prior to admission. She has some chills.    Allergies: Ampicillin; Codeine; Keflex; Sulfa antibiotics; and Vicodin Past Medical History  Diagnosis Date  . Hypertension    Prior to Admission medications   Medication Sig Start Date End Date Taking? Authorizing Provider  acetaminophen (TYLENOL) 500 MG tablet Take 500 mg by mouth every 6 (six) hours as needed. For pain.   Yes Historical Provider, MD  losartan (COZAAR) 50 MG tablet Take 50 mg by mouth daily.   Yes Historical Provider, MD  metoprolol succinate (TOPROL-XL) 25 MG 24 hr tablet Take 25 mg by mouth daily.   Yes Historical Provider, MD  polyethylene glycol (MIRALAX / GLYCOLAX) packet Take 17 g by mouth daily.   Yes Historical Provider, MD   Past Surgical History  Procedure Date  . Elbow fracture surgery   . Knee surgery   . Other surgical history     had to be on percocet for a year and it slowed down her "gut"   . Cholecystectomy   . Abdominal hysterectomy   . Appendectomy    No family history on file. History   Social History  . Marital Status: Married    Spouse Name: N/A    Number of Children: N/A  . Years of Education: N/A   Occupational History  . Work in Marketing executive.    Social History Main Topics  . Smoking status: Never Smoker   . Smokeless tobacco: Not on file  . Alcohol Use: No  . Drug Use: No  . Sexually Active:       REVIEW OF SYSTEMS:  Constitutional:  No weight loss, night sweats.  HEENT:  No headaches, Difficulty swallowing,Tooth/dental problems,Sore throat,   No sneezing, itching, ear ache, nasal congestion, post nasal drip,  Cardio-vascular:  No chest pain, Orthopnea, PND, swelling in lower extremities, anasarca, dizziness, palpitations  Resp:  No shortness of breath with exertion or at rest. No excess mucus, no productive cough, No non-productive cough, No coughing up of blood.No change in color of mucus.No wheezing.No chest wall deformity  Skin:  no rash or lesions.  GU:  no dysuria, change in color of urine, no urgency or frequency. No flank pain.  Musculoskeletal:  No joint pain or swelling. No decreased range of motion. No back pain.  Psych:  No change in mood or affect. No depression or anxiety. No memory loss.   Physical Exam: Filed Vitals:   01/05/12 1223 01/05/12 1350 01/05/12 1453 01/05/12 1841  BP: 136/78 129/72 137/60 134/75  Pulse: 105 101 88 94  Temp: 99 F (37.2 C)  98.3 F (36.8 C) 98.6 F (37 C)  TempSrc: Oral  Oral Oral  Resp: 19 19 20 24   SpO2: 96% 99% 100% 100%   No intake or output data in the 24 hours ending 01/05/12 1859 BP 134/75  Pulse 94  Temp(Src) 98.6 F (37 C) (Oral)  Resp 24  SpO2 100%  General Appearance:    Alert, cooperative, no distress, appears stated age  Head:    Normocephalic, without obvious abnormality, atraumatic  Eyes:  PERRL, conjunctiva/corneas clear, EOM's intact,     Ears:    Normal TM's and external ear canals, both ears  Nose:   Nares normal, septum midline, mucosa normal, no drainage    or sinus tenderness  Throat:   Lips, mucosa, and tongue normal; teeth and gums normal  Neck:   Supple, symmetrical, trachea midline, no adenopathy;    thyroid:  no enlargement/tenderness/nodules; no carotid   bruit or JVD  Back:     Symmetric, no curvature, ROM normal, no CVA tenderness  Lungs:     Clear to auscultation bilaterally, respirations unlabored      Heart:    Regular rate and rhythm, S1 and S2 normal, no murmur, rub   or gallop     Abdomen:     Soft, very tender to palpation  left lower quadrant,  bowel sounds active all four quadrants, no masses, no organomegaly. Nor rigidity, no guarding.         Extremities:   Extremities normal, atraumatic, no cyanosis or edema  Pulses:   2+ and symmetric all extremities  Skin:   Skin color, texture, turgor normal, no rashes or lesions  Lymph nodes:   Cervical, supraclavicular, and axillary nodes normal  Neurologic:   CNII-XII intact, normal strength, sensation and reflexes    throughout   Lab results:  Basename 01/05/12 1322  NA 136  K 3.6  CL 98  CO2 27  GLUCOSE 110*  BUN 14  CREATININE 0.68  CALCIUM 9.3  MG --  PHOS --    Basename 01/05/12 1322  AST 17  ALT 28  ALKPHOS 80  BILITOT 0.4  PROT 7.2  ALBUMIN 4.1    Basename 01/05/12 1322  LIPASE 21  AMYLASE --    Basename 01/05/12 1322  WBC 10.0  NEUTROABS 7.3  HGB 13.5  HCT 38.9  MCV 90.3  PLT 268   Imaging results:  Ct Abdomen Pelvis W Contrast  01/05/2012  *RADIOLOGY REPORT*  Clinical Data: Abdominal pain.  CT ABDOMEN AND PELVIS WITH CONTRAST  Technique:  Multidetector CT imaging of the abdomen and pelvis was performed following the standard protocol during bolus administration of intravenous contrast.  Contrast: 80mL OMNIPAQUE IOHEXOL 300 MG/ML  SOLN  Comparison: 06/18/2006  Findings: Scarring or atelectasis in the lung bases.  Heart is upper limits normal in size.  No effusions.  Prior cholecystectomy.  Liver, spleen, pancreas, adrenals, kidneys are unremarkable.  There is descending colonic and sigmoid diverticulosis.  Inflammatory changes noted around the sigmoid colon compatible with active diverticulitis.  There is a fluid collection noted possibly within the wall of the sigmoid colon. While this could represent a fluid-filled diverticulum, I cannot exclude an intramural abscess measuring 2 cm.  There is adjacent secondary wall thickening in the adjacent bladder.  Small bowel is decompressed.  No free fluid or free air.  Shotty retroperitoneal  lymph nodes, none pathologically enlarged.  No acute bony abnormality.  IMPRESSION: Changes of sigmoid diverticulitis.  Possible 2 cm abscess within the wall of the sigmoid colon.  Superior wall thickening in the adjacent bladder wall.  Prior cholecystectomy.  Original Report Authenticated By: Cyndie Chime, M.D.   Other results: EKG:    Patient Active Hospital Problem List:  Diverticulitis With Possible 2 cm abscess within the wall of the sigmoid colon: Patient present with abdominal pain, found to have diverticulitis, ? abscess. Will admit to regular floor. Will continue with Ciprofloxacin and flagyl. Will give Zofran PRN for nausea. NPO. I  ask ED to consult surgery for possible abscess. ED will consult surgery.   HTN (hypertension) (01/05/2012): Continue with metoprolol. I will hold  Cozaar.   UTI: UA with moderate leukocytes. Will follow Urine culture. Cipro will cover for UTI.     Tyquavious Gamel M.D. [facility] [phone] 01/05/2012, 6:59 PM

## 2012-01-05 NOTE — ED Provider Notes (Signed)
4:02 PM Care of the patient in the CDU assumed from [place], [place]. Pt presented with abd pain which began 5 days ago. Worst in the left lower quadrant. She is followed by Dr. Randa Evens of GI, but has not seen him since 2010. Lab investigation here was relatively unremarkable, but it was elected to proceed with a CT of the abdomen and pelvis. This showed diverticulitis. The current plan is to give her IV Cipro and Flagyl and to give her oral prescriptions for the same. She will need to followup in the office with Dr. Randa Evens. On exam, abd is soft with tenderness to palpation to LLQ with moderate guarding. No rebound or evidence for peritonitis.  4:54 PM I reviewed the CT findings and images which indicate diverticulitis with a questionable 2cm abscess inside the sigmoid colon wall and possible thickening of the bladder wall. Given these findings will plan to consult hospitalist for admission (unassigned). The patient has multiple allergies to medications, including, per her, "severe nausea and vomiting" with Flagyl and possible rash to ampicillin. Will consult with medicine for abx preference. I discussed new plan with pt who was agreeable.  6:30 PM I talked with Dr. Sunnie Nielsen with [facility] who agrees to admit. IV abx ordered. She requests surgical consult given the possibility of abscess and the possibility of thickening of the bladder wall. I've placed a page to [facility].  7:19 PM I talked with Dr. Donell Beers with surgery. She agrees to consult. Pt has assigned bed.  Results for orders placed during the hospital encounter of 01/05/12  CBC      Component Value Range   WBC 10.0  4.0 - 10.5 (K/uL)   RBC 4.31  3.87 - 5.11 (MIL/uL)   Hemoglobin 13.5  12.0 - 15.0 (g/dL)   HCT 78.2  95.6 - 21.3 (%)   MCV 90.3  78.0 - 100.0 (fL)   MCH 31.3  26.0 - 34.0 (pg)   MCHC 34.7  30.0 - 36.0 (g/dL)   RDW 08.6  57.8 - 46.9 (%)   Platelets 268  150 - 400 (K/uL)  DIFFERENTIAL      Component Value Range   Neutrophils Relative  73  43 - 77 (%)   Neutro Abs 7.3  1.7 - 7.7 (K/uL)   Lymphocytes Relative 17  12 - 46 (%)   Lymphs Abs 1.7  0.7 - 4.0 (K/uL)   Monocytes Relative 10  3 - 12 (%)   Monocytes Absolute 1.0  0.1 - 1.0 (K/uL)   Eosinophils Relative 0  0 - 5 (%)   Eosinophils Absolute 0.0  0.0 - 0.7 (K/uL)   Basophils Relative 0  0 - 1 (%)   Basophils Absolute 0.0  0.0 - 0.1 (K/uL)  COMPREHENSIVE METABOLIC PANEL      Component Value Range   Sodium 136  135 - 145 (mEq/L)   Potassium 3.6  3.5 - 5.1 (mEq/L)   Chloride 98  96 - 112 (mEq/L)   CO2 27  19 - 32 (mEq/L)   Glucose, Bld 110 (*) 70 - 99 (mg/dL)   BUN 14  6 - 23 (mg/dL)   Creatinine, Ser 6.29  0.50 - 1.10 (mg/dL)   Calcium 9.3  8.4 - 52.8 (mg/dL)   Total Protein 7.2  6.0 - 8.3 (g/dL)   Albumin 4.1  3.5 - 5.2 (g/dL)   AST 17  0 - 37 (U/L)   ALT 28  0 - 35 (U/L)   Alkaline Phosphatase 80  39 - 117 (  U/L)   Total Bilirubin 0.4  0.3 - 1.2 (mg/dL)   GFR calc non Af Amer >90  >90 (mL/min)   GFR calc Af Amer >90  >90 (mL/min)  LIPASE, BLOOD      Component Value Range   Lipase 21  11 - 59 (U/L)  URINALYSIS, ROUTINE W REFLEX MICROSCOPIC      Component Value Range   Color, Urine YELLOW  YELLOW    APPearance CLEAR  CLEAR    Specific Gravity, Urine 1.021  1.005 - 1.030    pH 6.5  5.0 - 8.0    Glucose, UA NEGATIVE  NEGATIVE (mg/dL)   Hgb urine dipstick NEGATIVE  NEGATIVE    Bilirubin Urine NEGATIVE  NEGATIVE    Ketones, ur NEGATIVE  NEGATIVE (mg/dL)   Protein, ur NEGATIVE  NEGATIVE (mg/dL)   Urobilinogen, UA 1.0  0.0 - 1.0 (mg/dL)   Nitrite NEGATIVE  NEGATIVE    Leukocytes, UA MODERATE (*) NEGATIVE   URINE MICROSCOPIC-ADD ON      Component Value Range   Squamous Epithelial / LPF FEW (*) RARE    WBC, UA 3-6  <3 (WBC/hpf)   Bacteria, UA RARE  RARE    Urine-Other MUCOUS PRESENT     Ct Abdomen Pelvis W Contrast  01/05/2012  *RADIOLOGY REPORT*  Clinical Data: Abdominal pain.  CT ABDOMEN AND PELVIS WITH CONTRAST  Technique:  Multidetector CT imaging of  the abdomen and pelvis was performed following the standard protocol during bolus administration of intravenous contrast.  Contrast: 80mL OMNIPAQUE IOHEXOL 300 MG/ML  SOLN  Comparison: 06/18/2006  Findings: Scarring or atelectasis in the lung bases.  Heart is upper limits normal in size.  No effusions.  Prior cholecystectomy.  Liver, spleen, pancreas, adrenals, kidneys are unremarkable.  There is descending colonic and sigmoid diverticulosis.  Inflammatory changes noted around the sigmoid colon compatible with active diverticulitis.  There is a fluid collection noted possibly within the wall of the sigmoid colon. While this could represent a fluid-filled diverticulum, I cannot exclude an intramural abscess measuring 2 cm.  There is adjacent secondary wall thickening in the adjacent bladder.  Small bowel is decompressed.  No free fluid or free air.  Shotty retroperitoneal lymph nodes, none pathologically enlarged.  No acute bony abnormality.  IMPRESSION: Changes of sigmoid diverticulitis.  Possible 2 cm abscess within the wall of the sigmoid colon.  Superior wall thickening in the adjacent bladder wall.  Prior cholecystectomy.  Original Report Authenticated By: Cyndie Chime, M.D.      Grant Fontana, [place] 01/05/12 Ernestina Columbia

## 2012-01-06 DIAGNOSIS — K651 Peritoneal abscess: Secondary | ICD-10-CM | POA: Diagnosis present

## 2012-01-06 LAB — URINE CULTURE
Colony Count: NO GROWTH
Culture  Setup Time: 201304161803
Culture: NO GROWTH

## 2012-01-06 LAB — COMPREHENSIVE METABOLIC PANEL
ALT: 19 U/L (ref 0–35)
CO2: 24 mEq/L (ref 19–32)
Calcium: 8.7 mg/dL (ref 8.4–10.5)
Chloride: 106 mEq/L (ref 96–112)
Creatinine, Ser: 0.62 mg/dL (ref 0.50–1.10)
GFR calc Af Amer: >90 mL/min (ref >90)
GFR calc non Af Amer: >90 mL/min (ref >90)
Glucose, Bld: 98 mg/dL (ref 70–99)
Total Bilirubin: 0.7 mg/dL (ref 0.3–1.2)

## 2012-01-06 LAB — PROTIME-INR
INR: 1.06 (ref 0.00–1.49)
Prothrombin Time: 14 seconds (ref 11.6–15.2)

## 2012-01-06 LAB — CBC
Hemoglobin: 12.2 g/dL (ref 12.0–15.0)
MCH: 30.5 pg (ref 26.0–34.0)
MCHC: 33.7 g/dL (ref 30.0–36.0)
Platelets: 244 10*3/uL (ref 150–400)

## 2012-01-06 MED ORDER — POTASSIUM CHLORIDE CRYS ER 20 MEQ PO TBCR
40.0000 meq | EXTENDED_RELEASE_TABLET | Freq: Once | ORAL | Status: AC
  Administered 2012-01-06: 40 meq via ORAL
  Filled 2012-01-06: qty 2

## 2012-01-06 MED ORDER — METOPROLOL SUCCINATE ER 25 MG PO TB24
25.0000 mg | ORAL_TABLET | ORAL | Status: DC
  Administered 2012-01-06 – 2012-01-07 (×2): 25 mg via ORAL
  Filled 2012-01-06 (×4): qty 1

## 2012-01-06 MED ORDER — LOSARTAN POTASSIUM 50 MG PO TABS
50.0000 mg | ORAL_TABLET | Freq: Every day | ORAL | Status: DC
  Filled 2012-01-06: qty 1

## 2012-01-06 MED ORDER — LOSARTAN POTASSIUM 50 MG PO TABS
50.0000 mg | ORAL_TABLET | ORAL | Status: DC
  Administered 2012-01-06 – 2012-01-07 (×2): 50 mg via ORAL
  Filled 2012-01-06 (×3): qty 1

## 2012-01-06 MED ORDER — METOPROLOL SUCCINATE ER 25 MG PO TB24: 25.0000 mg | ORAL_TABLET | ORAL | Status: DC

## 2012-01-06 NOTE — ED Provider Notes (Signed)
Medical screening examination/treatment/procedure(s) were performed by non-physician practitioner and as supervising physician I was immediately available for consultation/collaboration.   Ersilia Brawley, MD 01/06/12 0106 

## 2012-01-06 NOTE — Progress Notes (Signed)
DAILY PROGRESS NOTE                              GENERAL INTERNAL MEDICINE [facility]  SUBJECTIVE: Feels better than yesterday, has minimal nausea no vomiting.  OBJECTIVE: BP 124/78  Pulse 90  Temp(Src) 98.4 F (36.9 C) (Oral)  Resp 16  Wt 80 kg (176 lb 5.9 oz)  SpO2 92%  Intake/Output Summary (Last 24 hours) at 01/06/12 1010 Last data filed at 01/06/12 0900  Gross per 24 hour  Intake 828.33 ml  Output      3 ml  Net 825.33 ml                      Weight change:  Physical Exam: General: Alert and awake oriented x3 not in any acute distress. HEENT: anicteric sclera, pupils equal reactive to light and accommodation CVS: S1-S2 heard, no murmur rubs or gallops Chest: clear to auscultation bilaterally, no wheezing rales or rhonchi Abdomen:  Moderate tenderness in the LUQ Neuro: Cranial nerves II-XII intact, no focal neurological deficits Extremities: no cyanosis, no clubbing or edema noted bilaterally   Lab Results:  Basename 01/06/12 0510 01/05/12 1322  NA 141 136  K 3.5 3.6  CL 106 98  CO2 24 27  GLUCOSE 98 110*  BUN 8 14  CREATININE 0.62 0.68  CALCIUM 8.7 9.3  MG -- --  PHOS -- --    Basename 01/06/12 0510 01/05/12 1322  AST 13 17  ALT 19 28  ALKPHOS 67 80  BILITOT 0.7 0.4  PROT 5.9* 7.2  ALBUMIN 3.2* 4.1    Basename 01/05/12 1322  LIPASE 21  AMYLASE --    Basename 01/06/12 0510 01/05/12 1322  WBC 8.2 10.0  NEUTROABS -- 7.3  HGB 12.2 13.5  HCT 36.2 38.9  MCV 90.5 90.3  PLT 244 268    Micro Results: No results found for this or any previous visit (from the past 240 hour(s)).  Studies/Results: Ct Abdomen Pelvis W Contrast  01/05/2012  *RADIOLOGY REPORT*  Clinical Data: Abdominal pain.  CT ABDOMEN AND PELVIS WITH CONTRAST  Technique:  Multidetector CT imaging of the abdomen and pelvis was performed following the standard protocol during bolus administration of intravenous contrast.  Contrast: 80mL OMNIPAQUE IOHEXOL 300 MG/ML  SOLN   Comparison: 06/18/2006  Findings: Scarring or atelectasis in the lung bases.  Heart is upper limits normal in size.  No effusions.  Prior cholecystectomy.  Liver, spleen, pancreas, adrenals, kidneys are unremarkable.  There is descending colonic and sigmoid diverticulosis.  Inflammatory changes noted around the sigmoid colon compatible with active diverticulitis.  There is a fluid collection noted possibly within the wall of the sigmoid colon. While this could represent a fluid-filled diverticulum, I cannot exclude an intramural abscess measuring 2 cm.  There is adjacent secondary wall thickening in the adjacent bladder.  Small bowel is decompressed.  No free fluid or free air.  Shotty retroperitoneal lymph nodes, none pathologically enlarged.  No acute bony abnormality.  IMPRESSION: Changes of sigmoid diverticulitis.  Possible 2 cm abscess within the wall of the sigmoid colon.  Superior wall thickening in the adjacent bladder wall.  Prior cholecystectomy.  Original Report Authenticated By: Cyndie Chime, M.D.   Medications: Scheduled Meds:   . ciprofloxacin  400 mg Intravenous Q12H  . fentaNYL  50 mcg Intravenous Once  . fentaNYL  50 mcg Intravenous Once  . metoprolol succinate  25 mg Oral Q24H  . metronidazole  500 mg Intravenous Q8H  . ondansetron  4 mg Intravenous Once  . pantoprazole (PROTONIX) IV  40 mg Intravenous Q12H  . sodium chloride  1,000 mL Intravenous Once  . sodium chloride  500 mL Intravenous Once  . white petrolatum      . DISCONTD: ciprofloxacin  400 mg Intravenous Once  . DISCONTD: fentaNYL  50 mcg Intravenous Once  . DISCONTD: metoprolol succinate  25 mg Oral Daily  . DISCONTD: metronidazole  500 mg Intravenous Once   Continuous Infusions:   . sodium chloride 100 mL/hr at 01/06/12 0942   PRN Meds:.acetaminophen, acetaminophen, fentaNYL, iohexol, ondansetron (ZOFRAN) IV  ASSESSMENT & PLAN: Principal Problem:  *Diverticulitis Active Problems:  HTN (hypertension)   Intra-abdominal abscess  Acute sigmoid diverticulitis -Patient started on Cipro and Flagyl -Denies fever and chills, feels much better. -Continue bowel rest and antibiotics. -CT scan of abdomen pelvis showed 2 cm intra-abdominal abscess, general surgery on board.  Hypertension -Continue preadmission pressure medications.   LOS: 1 day   Margaux Engen A 01/06/2012, 10:10 AM

## 2012-01-06 NOTE — Progress Notes (Signed)
Utilization review complete 

## 2012-01-06 NOTE — Progress Notes (Signed)
Patient ID: Gloria Bell, female   DOB: 01-Sep-1951, 61 y.o.   MRN: [id number]    Subjective: Pt feels better this morning.  Has less LLQ pain.  Objective: Vital signs in last 24 hours: Temp:  [98.3 F (36.8 C)-99 F (37.2 C)] 98.4 F (36.9 C) (04/17 0500) Pulse Rate:  [88-118] 90  (04/17 0500) Resp:  [16-24] 16  (04/17 0500) BP: (124-146)/(60-80) 124/78 mmHg (04/17 0500) SpO2:  [92 %-100 %] 92 % (04/17 0500) Weight:  [176 lb 5.9 oz (80 kg)] 176 lb 5.9 oz (80 kg) (04/17 0500) Last BM Date: 01/05/12  Intake/Output from previous day: 04/16 0701 - 04/17 0700 In: 828.3 [I.V.:828.3] Out: 3 [Urine:3] Intake/Output this shift:    PE: Abd: soft, mild LLQ abdominal tenderness, +BS, ND  Lab Results:   Basename 01/06/12 0510 01/05/12 1322  WBC 8.2 10.0  HGB 12.2 13.5  HCT 36.2 38.9  PLT 244 268   BMET  Basename 01/06/12 0510 01/05/12 1322  NA 141 136  K 3.5 3.6  CL 106 98  CO2 24 27  GLUCOSE 98 110*  BUN 8 14  CREATININE 0.62 0.68  CALCIUM 8.7 9.3   PT/INR  Basename 01/06/12 0510  LABPROT 14.0  INR 1.06   CMP     Component Value Date/Time   NA 141 01/06/2012 0510   K 3.5 01/06/2012 0510   CL 106 01/06/2012 0510   CO2 24 01/06/2012 0510   GLUCOSE 98 01/06/2012 0510   BUN 8 01/06/2012 0510   CREATININE 0.62 01/06/2012 0510   CALCIUM 8.7 01/06/2012 0510   PROT 5.9* 01/06/2012 0510   ALBUMIN 3.2* 01/06/2012 0510   AST 13 01/06/2012 0510   ALT 19 01/06/2012 0510   ALKPHOS 67 01/06/2012 0510   BILITOT 0.7 01/06/2012 0510   GFRNONAA >90 01/06/2012 0510   GFRAA >90 01/06/2012 0510   Lipase     Component Value Date/Time   LIPASE 21 01/05/2012 1322       Studies/Results: Ct Abdomen Pelvis W Contrast  01/05/2012  *RADIOLOGY REPORT*  Clinical Data: Abdominal pain.  CT ABDOMEN AND PELVIS WITH CONTRAST  Technique:  Multidetector CT imaging of the abdomen and pelvis was performed following the standard protocol during bolus administration of intravenous contrast.  Contrast:  80mL OMNIPAQUE IOHEXOL 300 MG/ML  SOLN  Comparison: 06/18/2006  Findings: Scarring or atelectasis in the lung bases.  Heart is upper limits normal in size.  No effusions.  Prior cholecystectomy.  Liver, spleen, pancreas, adrenals, kidneys are unremarkable.  There is descending colonic and sigmoid diverticulosis.  Inflammatory changes noted around the sigmoid colon compatible with active diverticulitis.  There is a fluid collection noted possibly within the wall of the sigmoid colon. While this could represent a fluid-filled diverticulum, I cannot exclude an intramural abscess measuring 2 cm.  There is adjacent secondary wall thickening in the adjacent bladder.  Small bowel is decompressed.  No free fluid or free air.  Shotty retroperitoneal lymph nodes, none pathologically enlarged.  No acute bony abnormality.  IMPRESSION: Changes of sigmoid diverticulitis.  Possible 2 cm abscess within the wall of the sigmoid colon.  Superior wall thickening in the adjacent bladder wall.  Prior cholecystectomy.  Original Report Authenticated By: Cyndie Chime, M.D.    Anti-infectives: Anti-infectives     Start     Dose/Rate Route Frequency Ordered Stop   01/05/12 1900   ciprofloxacin (CIPRO) IVPB 400 mg        400 mg 200 mL/hr over  60 Minutes Intravenous Every 12 hours 01/05/12 1858     01/05/12 1900   metroNIDAZOLE (FLAGYL) IVPB 500 mg        500 mg 100 mL/hr over 60 Minutes Intravenous Every 8 hours 01/05/12 1858     01/05/12 1600   ciprofloxacin (CIPRO) IVPB 400 mg  Status:  Discontinued        400 mg 200 mL/hr over 60 Minutes Intravenous  Once 01/05/12 1554 01/05/12 2111   01/05/12 1600   metroNIDAZOLE (FLAGYL) IVPB 500 mg  Status:  Discontinued        500 mg 100 mL/hr over 60 Minutes Intravenous  Once 01/05/12 1554 01/05/12 2111           Assessment/Plan  1. Diverticulitis with 2 cm abscess  Plan: 1. Cont conservative treatment for now as patient seems to have made some improvement already  since last night.  She is agreeable to try the flagyl as long as she is given nausea medicine before hand. 2. NPO today, if better tomorrow, then try clears.   LOS: 1 day    Holland Kotter E 01/06/2012

## 2012-01-07 LAB — BASIC METABOLIC PANEL
BUN: 7 mg/dL (ref 6–23)
Calcium: 8.5 mg/dL (ref 8.4–10.5)
Chloride: 106 mEq/L (ref 96–112)
Creatinine, Ser: 0.58 mg/dL (ref 0.50–1.10)
GFR calc Af Amer: >90 mL/min (ref >90)
GFR calc non Af Amer: >90 mL/min (ref >90)

## 2012-01-07 NOTE — Progress Notes (Signed)
DAILY PROGRESS NOTE                              GENERAL [facility]  SUBJECTIVE: Feels better than yesterday, has minimal nausea no vomiting. Reported 4-5 episodes of loose stools last night.  OBJECTIVE: BP 120/77  Pulse 67  Temp(Src) 98.8 F (37.1 C) (Oral)  Resp 20  Ht 5\' 6"  (1.676 m)  Wt 81.5 kg (179 lb 10.8 oz)  BMI 29.00 kg/m2  SpO2 97%  Intake/Output Summary (Last 24 hours) at 01/07/12 1333 Last data filed at 01/07/12 1300  Gross per 24 hour  Intake 1703.33 ml  Output     10 ml  Net 1693.33 ml                      Weight change: 1.5 kg (3 lb 4.9 oz) Physical Exam: General: Alert and awake oriented x3 not in any acute distress. HEENT: anicteric sclera, pupils equal reactive to light and accommodation CVS: S1-S2 heard, no murmur rubs or gallops Chest: clear to auscultation bilaterally, no wheezing rales or rhonchi Abdomen:  Moderate tenderness in the LUQ Neuro: Cranial nerves II-XII intact, no focal neurological deficits Extremities: no cyanosis, no clubbing or edema noted bilaterally   Lab Results:  Basename 01/07/12 0500 01/06/12 0510  NA 140 141  K 3.6 3.5  CL 106 106  CO2 24 24  GLUCOSE 85 98  BUN 7 8  CREATININE 0.58 0.62  CALCIUM 8.5 8.7  MG -- --  PHOS -- --    Basename 01/06/12 0510 01/05/12 1322  AST 13 17  ALT 19 28  ALKPHOS 67 80  BILITOT 0.7 0.4  PROT 5.9* 7.2  ALBUMIN 3.2* 4.1    Basename 01/05/12 1322  LIPASE 21  AMYLASE --    Basename 01/06/12 0510 01/05/12 1322  WBC 8.2 10.0  NEUTROABS -- 7.3  HGB 12.2 13.5  HCT 36.2 38.9  MCV 90.5 90.3  PLT 244 268    Micro Results: Recent Results (from the past 240 hour(s))  URINE CULTURE     Status: Normal   Collection Time   01/05/12  1:19 PM      Component Value Range Status Comment   Specimen Description URINE, CLEAN CATCH   Final    Special Requests ADDED ON [id number] @1743    Final    Culture  Setup Time [id number]   Final    Colony Count NO GROWTH    Final    Culture NO GROWTH   Final    Report Status 01/06/2012 FINAL   Final     Studies/Results: Ct Abdomen Pelvis W Contrast  01/05/2012  *RADIOLOGY REPORT*  Clinical Data: Abdominal pain.  CT ABDOMEN AND PELVIS WITH CONTRAST  Technique:  Multidetector CT imaging of the abdomen and pelvis was performed following the standard protocol during bolus administration of intravenous contrast.  Contrast: 80mL OMNIPAQUE IOHEXOL 300 MG/ML  SOLN  Comparison: 06/18/2006  Findings: Scarring or atelectasis in the lung bases.  Heart is upper limits normal in size.  No effusions.  Prior cholecystectomy.  Liver, spleen, pancreas, adrenals, kidneys are unremarkable.  There is descending colonic and sigmoid diverticulosis.  Inflammatory changes noted around the sigmoid colon compatible with active diverticulitis.  There is a fluid collection noted possibly within the wall of the sigmoid colon. While this could represent a fluid-filled diverticulum, I cannot exclude an intramural abscess measuring 2 cm.  There is adjacent secondary wall thickening in the adjacent bladder.  Small bowel is decompressed.  No free fluid or free air.  Shotty retroperitoneal lymph nodes, none pathologically enlarged.  No acute bony abnormality.  IMPRESSION: Changes of sigmoid diverticulitis.  Possible 2 cm abscess within the wall of the sigmoid colon.  Superior wall thickening in the adjacent bladder wall.  Prior cholecystectomy.  Original Report Authenticated By: Cyndie Chime, M.D.   Medications: Scheduled Meds:    . ciprofloxacin  400 mg Intravenous Q12H  . losartan  50 mg Oral Q24H  . metoprolol succinate  25 mg Oral Q24H  . metronidazole  500 mg Intravenous Q8H  . pantoprazole (PROTONIX) IV  40 mg Intravenous Q12H  . DISCONTD: losartan  50 mg Oral Daily  . DISCONTD: metoprolol succinate  25 mg Oral Q24H   Continuous Infusions:    . sodium chloride 1,000 mL (01/07/12 1154)   PRN Meds:.acetaminophen, acetaminophen, fentaNYL,  ondansetron (ZOFRAN) IV  ASSESSMENT & PLAN: Principal Problem:  *Diverticulitis Active Problems:  HTN (hypertension)  Intra-abdominal abscess  Acute sigmoid diverticulitis -Patient started on Cipro and Flagyl -Denies fever and chills, feels much better. -Continue bowel rest and antibiotics. -CT scan of abdomen pelvis showed 2 cm intra-abdominal abscess, general surgery on board. -All doing much better, per general surgery, start clear liquids today.   Hypertension -Continue preadmission pressure medications.   LOS: 2 days   Gloria Bell A 01/07/2012, 1:33 PM

## 2012-01-07 NOTE — Plan of Care (Signed)
Problem: Phase II Progression Outcomes Goal: Discharge plan established Outcome: Completed/Met Date Met:  01/07/12 To return home

## 2012-01-07 NOTE — Progress Notes (Signed)
   CARE MANAGEMENT NOTE 01/07/2012  Patient:  GALAXY, BORDEN   Account Number:  [id number]  Date Initiated:  01/07/2012  Documentation initiated by:  Donn Pierini  Subjective/Objective Assessment:   Pt admitted with abd pain diverticulitis     Action/Plan:   PTA pt lived at home with spouse, was independent with ADLs   Anticipated DC Date:  01/09/2012   Anticipated DC Plan:  HOME/SELF CARE      DC Planning Services  CM consult      Choice offered to / List presented to:             Status of service:  In process, will continue to follow Medicare Important Message given?   (If response is "NO", the following Medicare IM given date fields will be blank) Date Medicare IM given:   Date Additional Medicare IM given:    Discharge Disposition:    Per UR Regulation:    If discussed at Long Length of Stay Meetings, dates discussed:    Comments:  PCP- Chaplin  01/07/12- 1640- Donn Pierini RN, BSN [phone] Spoke with pt at bedside- per conversation pt states that she lives at home with spouse- independent with ADLs does not use any DME- pt has medication coverage and uses mail order to get most medication, [facility] for others. Plan is to return home when medically ready- CM to follow

## 2012-01-07 NOTE — Discharge Instructions (Signed)
Low Fiber and Residue Restricted Diet A low fiber diet restricts foods that contain carbohydrates that are not digested in the small intestine. A diet containing about 10 g of fiber is considered low fiber. The diet needs to be individualized to suit patient tolerances and preferences and to avoid unnecessary restrictions. Generally, the foods emphasized in a low fiber diet have no skins or seeds. They may have been processed to remove bran, germ, or husks. Cooking may not necessarily eliminate the fiber. Cooking may, in fact, enable a greater quantity of fiber to be consumed in a lesser volume. Legumes and nuts are also restricted. The term low residue has also been used to describe low fiber diets, although the two are not the same. Residue refers to any substance that adds to bowel (colonic) contents, such as sloughed cells and intestinal bacteria, in addition to fiber. Residue-containing foods, prunes and prune juice, milk, and connective tissue from meats may also need to be eliminated. It is important to eliminate these foods during sudden (acute) attacks of inflammatory bowel disease, when there is a partial obstruction due to another reason, or when minimal fecal output is desired. When these problems are gone, a more normal diet may be used. PURPOSE  Prevent blockage of a partially obstructed or narrowed gastrointestinal tract.   Reduce stool weight and volume.   Slow the movement of waste.  WHEN IS THIS DIET USED?  Acute phase of Crohn's disease, ulcerative colitis, regional enteritis, or diverticulitis.   Narrowing (stenosis) of intestinal or esophageal tubes (lumina).   Transitional diet following surgery, injury (trauma), or illness.  ADEQUACY This diet is nutritionally adequate based on individual food choices according to the Recommended Dietary Allowances of the National Research Council. CHOOSING FOODS Check labels, especially on foods from the starch list. Often, dietary fiber  content is listed with the Nutrition Facts panel.  Breads and Starches  Allowed: White, French, and pita breads, plain rolls, buns, or sweet rolls, doughnuts, waffles, pancakes, bagels. Plain muffins, sweet breads, biscuits, matzoth. Flour. Soda, saltine, or graham crackers. Pretzels, rusks, melba toast, zwieback. Cooked cereals: cornmeal, farina, cream cereals. Dry cereals: refined corn, wheat, rice, and oat cereals (check label). Potatoes prepared any way without skins, refined macaroni, spaghetti, noodles, refined rice.   Avoid: Bread, rolls, or crackers made with whole-wheat, multigrains, rye, bran seeds, nuts, or coconut. Corn tortillas, table-shells. Corn chips, tortilla chips. Cereals containing whole-grains, multigrains, bran, coconut, nuts, or raisins. Cooked or dry oatmeal. Coarse wheat cereals, granola. Cereals advertised as "high fiber." Potato skins. Whole-grain pasta, wild or brown rice. Popcorn.  Vegetables  Allowed:  Strained tomato and vegetable juices. Fresh: tender lettuce, cucumber, cabbage, spinach, bean sprouts. Cooked, canned: asparagus, bean sprouts, cut green or wax beans, cauliflower, pumpkin, beets, mushrooms, olives, spinach, yellow squash, tomato, tomato sauce (no seeds), zucchini (peeled), turnips. Canned sweet potatoes. Small amounts of celery, onion, radish, and green pepper may be used. Keep servings limited to  cup.   Avoid: Fresh, cooked, or canned: artichokes, baked beans, beet greens, broccoli, Brussels sprouts, French-style green beans, corn, kale, legumes, peas, sweet potatoes. Cooked: green or red cabbage, spinach. Avoid large servings of any vegetables.  Fruit  Allowed:  All fruit juices except prune juice. Cooked or canned: apricots applesauce, cantaloupe, cherries, grapefruit, grapes, kiwi, mandarin oranges, peaches, pears, fruit cocktail, pineapple, plums, watermelon. Fresh: banana, grapes, cantaloupe, avocado, cherries, pineapple, grapefruit, kiwi,  nectarines, peaches, oranges, blueberries, plums. Keep servings limited to  cup or 1 piece.     Avoid: Fresh: apple with or without skin, apricots, mango, pears, raspberries, strawberries. Prune juice, stewed or dried prunes. Dried fruits, raisins, dates. Avoid large servings of all fresh fruits.  Meat and Meat Substitutes  Allowed:  Ground or well-cooked tender beef, ham, veal, lamb, pork, or poultry. Eggs, plain cheese. Fish, oysters, shrimp, lobster, other seafood. Liver, organ meats.   Avoid: Tough, fibrous meats with gristle. Peanut butter, smooth or chunky. Cheese with seeds, nuts, or other foods not allowed. Nuts, seeds, legumes, dried peas, beans, lentils.  Milk  Allowed:  All milk products except those not allowed. Milk and milk product consumption should be minimal when low residue is desired.   Avoid: Yogurt that contains nuts or seeds.  Soups and Combination Foods  Allowed:  Bouillon, broth, or cream soups made from allowed foods. Any strained soup. Casseroles or mixed dishes made with allowed foods.   Avoid: Soups made from vegetables that are not allowed or that contain other foods not allowed.  Desserts and Sweets  Allowed:  Plain cakes and cookies, pie made with allowed fruit, pudding, custard, cream pie. Gelatin, fruit, ice, sherbet, frozen ice pops. Ice cream, ice milk without nuts. Plain hard candy, honey, jelly, molasses, syrup, sugar, chocolate syrup, gumdrops, marshmallows.   Avoid: Desserts, cookies, or candies that contain nuts, peanut butter, or dried fruits. Jams, preserves with seeds, marmalade.  Fats and Oils  Allowed:  Margarine, butter, cream, mayonnaise, salad oils, plain salad dressings made from allowed foods. Plain gravy, crisp bacon without rind.   Avoid: Seeds, nuts, olives. Avocados.  Beverages  Allowed:  All, except those listed to avoid.   Avoid: Fruit juices with high pulp, prune juice.  Condiments  Allowed:  Ketchup, mustard, horseradish,  vinegar, cream sauce, cheese sauce, cocoa powder. Spices in moderation: allspice, basil, bay leaves, celery powder or leaves, cinnamon, cumin powder, curry powder, ginger, mace, marjoram, onion or garlic powder, oregano, paprika, parsley flakes, ground pepper, rosemary, sage, savory, tarragon, thyme, turmeric.   Avoid: Coconut, pickles.  SAMPLE MEAL PLAN The following menu is provided as a sample. Your daily menu plans will vary. Be sure to include a minimum of the following each day in order to provide essential nutrients for the adult:  Starch/Bread/Cereal Group, 6 servings.   Fruit/Vegetable Group, 5 servings.   Meat/Meat Substitute Group, 2 servings.   Milk/Milk Substitute Group, 2 servings.  A serving is equal to  cup for fruits, vegetables, and cooked cereals or 1 piece for foods such as a piece of bread, 1 orange, or 1 apple. For dry cereals and crackers, use serving sizes listed on the label. Combination foods may count as full or partial servings from various food groups. Fats, desserts, and sweets may be added to the meal plan after the requirements for essential nutrients are met. SAMPLE MENU Breakfast   cup orange juice.   1 boiled egg.   1 slice white toast.   Margarine.    cup cornflakes.   1 cup milk.   Beverage.  Lunch   cup chicken noodle soup.   2 to 3 oz sliced roast beef.   2 slices seedless rye bread.   Mayonnaise.    cup tomato juice.   1 small banana.   Beverage.  Dinner  3 oz baked chicken.    cup scalloped potatoes.    cup cooked beets.   White dinner roll.   Margarine.    cup canned peaches.   Beverage.  Document Released: 02/27/2002 Document Revised: 08/27/2011   Document Reviewed: 08/10/2011 Exit[employer] Patient Information 2012 [employer].  Diverticulitis A diverticulum is a small pouch or sac on the colon. Diverticulosis is the presence of these diverticula on the colon. Diverticulitis is the irritation  (inflammation) or infection of diverticula. CAUSES  The colon and its diverticula contain bacteria. If food particles block the tiny opening to a diverticulum, the bacteria inside can grow and cause an increase in pressure. This leads to infection and inflammation and is called diverticulitis. SYMPTOMS   Abdominal pain and tenderness. Usually, the pain is located on the left side of your abdomen. However, it could be located elsewhere.   Fever.   Bloating.   Feeling sick to your stomach (nausea).   Throwing up (vomiting).   Abnormal stools.  DIAGNOSIS  Your caregiver will take a history and perform a physical exam. Since many things can cause abdominal pain, other tests may be necessary. Tests may include:  Blood tests.   Urine tests.   X-ray of the abdomen.   CT scan of the abdomen.  Sometimes, surgery is needed to determine if diverticulitis or other conditions are causing your symptoms. TREATMENT  Most of the time, you can be treated without surgery. Treatment includes:  Resting the bowels by only having liquids for a few days. As you improve, you will need to eat a low-fiber diet.   Intravenous (IV) fluids if you are losing body fluids (dehydrated).   Antibiotic medicines that treat infections may be given.   Pain and nausea medicine, if needed.   Surgery if the inflamed diverticulum has burst.  HOME CARE INSTRUCTIONS   Try a clear liquid diet (broth, tea, or water for as long as directed by your caregiver). You may then gradually begin a low-fiber diet as tolerated. A low-fiber diet is a diet with less than 10 grams of fiber. Choose the foods below to reduce fiber in the diet:   White breads, cereals, rice, and pasta.   Cooked fruits and vegetables or soft fresh fruits and vegetables without the skin.   Ground or well-cooked tender beef, ham, veal, lamb, pork, or poultry.   Eggs and seafood.   After your diverticulitis symptoms have improved, your caregiver may  put you on a high-fiber diet. A high-fiber diet includes 14 grams of fiber for every 1000 calories consumed. For a standard 2000 calorie diet, you would need 28 grams of fiber. Follow these diet guidelines to help you increase the fiber in your diet. It is important to slowly increase the amount fiber in your diet to avoid gas, constipation, and bloating.   Choose whole-grain breads, cereals, pasta, and brown rice.   Choose fresh fruits and vegetables with the skin on. Do not overcook vegetables because the more vegetables are cooked, the more fiber is lost.   Choose more nuts, seeds, legumes, dried peas, beans, and lentils.   Look for food products that have greater than 3 grams of fiber per serving on the Nutrition Facts label.   Take all medicine as directed by your caregiver.   If your caregiver has given you a follow-up appointment, it is very important that you go. Not going could result in lasting (chronic) or permanent injury, pain, and disability. If there is any problem keeping the appointment, call to reschedule.  SEEK MEDICAL CARE IF:   Your pain does not improve.   You have a hard time advancing your diet beyond clear liquids.   Your bowel movements do not return to normal.    SEEK IMMEDIATE MEDICAL CARE IF:   Your pain becomes worse.   You have an oral temperature above 102 F (38.9 C), not controlled by medicine.   You have repeated vomiting.   You have bloody or black, tarry stools.   Symptoms that brought you to your caregiver become worse or are not getting better.  MAKE SURE YOU:   Understand these instructions.   Will watch your condition.   Will get help right away if you are not doing well or get worse.  Document Released: 06/17/2005 Document Revised: 08/27/2011 Document Reviewed: 10/13/2010 Exit[employer] Patient Information 2012 [employer]. 

## 2012-01-07 NOTE — Progress Notes (Signed)
Patient ID: Gloria Bell, female   DOB: [birth year], 61 y.o.   MRN: [id number]    Subjective: Pt feeling better.  No nausea.  Had 4 BMs yesterday, loose  Objective: Vital signs in last 24 hours: Temp:  [98.1 F (36.7 C)-98.4 F (36.9 C)] 98.4 F (36.9 C) (04/18 0516) Pulse Rate:  [72-84] 74  (04/18 0516) Resp:  [18-20] 20  (04/18 0516) BP: (115-137)/(73-85) 135/76 mmHg (04/18 0516) SpO2:  [95 %-96 %] 96 % (04/18 0516) Weight:  [179 lb 10.8 oz (81.5 kg)] 179 lb 10.8 oz (81.5 kg) (04/18 0516) Last BM Date: 01/06/12  Intake/Output from previous day: 04/17 0701 - 04/18 0700 In: 1223.3 [I.V.:1123.3; IV Piggyback:100] Out: 8 [Urine:8] Intake/Output this shift:    PE: Abd: soft, minimal LLQ tenderness, +BS, ND Heart: regular Lungs: CTAB  Lab Results:   Basename 01/06/12 0510 01/05/12 1322  WBC 8.2 10.0  HGB 12.2 13.5  HCT 36.2 38.9  PLT 244 268   BMET  Basename 01/07/12 0500 01/06/12 0510  NA 140 141  K 3.6 3.5  CL 106 106  CO2 24 24  GLUCOSE 85 98  BUN 7 8  CREATININE 0.58 0.62  CALCIUM 8.5 8.7   PT/INR  Basename 01/06/12 0510  LABPROT 14.0  INR 1.06   CMP     Component Value Date/Time   NA 140 01/07/2012 0500   K 3.6 01/07/2012 0500   CL 106 01/07/2012 0500   CO2 24 01/07/2012 0500   GLUCOSE 85 01/07/2012 0500   BUN 7 01/07/2012 0500   CREATININE 0.58 01/07/2012 0500   CALCIUM 8.5 01/07/2012 0500   PROT 5.9* 01/06/2012 0510   ALBUMIN 3.2* 01/06/2012 0510   AST 13 01/06/2012 0510   ALT 19 01/06/2012 0510   ALKPHOS 67 01/06/2012 0510   BILITOT 0.7 01/06/2012 0510   GFRNONAA >90 01/07/2012 0500   GFRAA >90 01/07/2012 0500   Lipase     Component Value Date/Time   LIPASE 21 01/05/2012 1322       Studies/Results: Ct Abdomen Pelvis W Contrast  01/05/2012  *RADIOLOGY REPORT*  Clinical Data: Abdominal pain.  CT ABDOMEN AND PELVIS WITH CONTRAST  Technique:  Multidetector CT imaging of the abdomen and pelvis was performed following the standard protocol during  bolus administration of intravenous contrast.  Contrast: 80mL OMNIPAQUE IOHEXOL 300 MG/ML  SOLN  Comparison: 06/18/2006  Findings: Scarring or atelectasis in the lung bases.  Heart is upper limits normal in size.  No effusions.  Prior cholecystectomy.  Liver, spleen, pancreas, adrenals, kidneys are unremarkable.  There is descending colonic and sigmoid diverticulosis.  Inflammatory changes noted around the sigmoid colon compatible with active diverticulitis.  There is a fluid collection noted possibly within the wall of the sigmoid colon. While this could represent a fluid-filled diverticulum, I cannot exclude an intramural abscess measuring 2 cm.  There is adjacent secondary wall thickening in the adjacent bladder.  Small bowel is decompressed.  No free fluid or free air.  Shotty retroperitoneal lymph nodes, none pathologically enlarged.  No acute bony abnormality.  IMPRESSION: Changes of sigmoid diverticulitis.  Possible 2 cm abscess within the wall of the sigmoid colon.  Superior wall thickening in the adjacent bladder wall.  Prior cholecystectomy.  Original Report Authenticated By: Cyndie Chime, M.D.    Anti-infectives: Anti-infectives     Start     Dose/Rate Route Frequency Ordered Stop   01/05/12 1900   ciprofloxacin (CIPRO) IVPB 400 mg  400 mg 200 mL/hr over 60 Minutes Intravenous Every 12 hours 01/05/12 1858     01/05/12 1900   metroNIDAZOLE (FLAGYL) IVPB 500 mg        500 mg 100 mL/hr over 60 Minutes Intravenous Every 8 hours 01/05/12 1858     01/05/12 1600   ciprofloxacin (CIPRO) IVPB 400 mg  Status:  Discontinued        400 mg 200 mL/hr over 60 Minutes Intravenous  Once 01/05/12 1554 01/05/12 2111   01/05/12 1600   metroNIDAZOLE (FLAGYL) IVPB 500 mg  Status:  Discontinued        500 mg 100 mL/hr over 60 Minutes Intravenous  Once 01/05/12 1554 01/05/12 2111           Assessment/Plan  1. Diverticulitis with small abscess  Plan: 1. Patient seems to be improving as her  pain is getting better.  She remains AF and WBC are normal.  She may have clear liquids today.  Would continue IV abx therapy and look at switching to oral abx over next couple of days. Will follow  LOS: 2 days    Kacee Koren E 01/07/2012

## 2012-01-08 ENCOUNTER — Telehealth (INDEPENDENT_AMBULATORY_CARE_PROVIDER_SITE_OTHER): Payer: Self-pay | Admitting: General Surgery

## 2012-01-08 DIAGNOSIS — K5792 Diverticulitis of intestine, part unspecified, without perforation or abscess without bleeding: Secondary | ICD-10-CM

## 2012-01-08 MED ORDER — PANTOPRAZOLE SODIUM 40 MG PO TBEC: 40.0000 mg | DELAYED_RELEASE_TABLET | Freq: Two times a day (BID) | ORAL | Status: DC

## 2012-01-08 MED ORDER — FLUCONAZOLE 150 MG PO TABS
150.0000 mg | ORAL_TABLET | Freq: Once | ORAL | Status: AC
  Administered 2012-01-08: 150 mg via ORAL
  Filled 2012-01-08: qty 1

## 2012-01-08 MED ORDER — AMOXICILLIN-POT CLAVULANATE 875-125 MG PO TABS: 1.0000 | ORAL_TABLET | Freq: Two times a day (BID) | ORAL | Status: AC

## 2012-01-08 MED ORDER — FLUCONAZOLE 150 MG PO TABS
150.0000 mg | ORAL_TABLET | Freq: Every day | ORAL | Status: DC
  Filled 2012-01-08: qty 1

## 2012-01-08 MED ORDER — AMOXICILLIN-POT CLAVULANATE 875-125 MG PO TABS
1.0000 | ORAL_TABLET | Freq: Two times a day (BID) | ORAL | Status: DC
  Administered 2012-01-08: 1 via ORAL
  Filled 2012-01-08 (×2): qty 1

## 2012-01-08 NOTE — ED Provider Notes (Signed)
Medical screening examination/treatment/procedure(s) were conducted as a shared visit with non-physician practitioner(s) and myself.  I personally evaluated the patient during the encounter.  Left lower quadrant pain for several days. Will obtain CT scan to rule out diverticulitis  Donnetta Hutching, MD 01/08/12 1427

## 2012-01-08 NOTE — Telephone Encounter (Signed)
Order placed and sent to [facility] for scheduling.

## 2012-01-08 NOTE — Progress Notes (Signed)
Patient ID: Gloria Bell, female   DOB: 12/10/1950, 61 y.o.   MRN: [id number]    Subjective: Pt feels ok.  Says the cipro and flagyl are hard for her to handle.  Can take augmentin.  Tolerating clear liquids well.  Objective: Vital signs in last 24 hours: Temp:  [98 F (36.7 C)-98.8 F (37.1 C)] 98.1 F (36.7 C) (04/19 0500) Pulse Rate:  [66-73] 73  (04/19 0500) Resp:  [16-20] 16  (04/19 0500) BP: (120-152)/(77-83) 152/83 mmHg (04/19 0500) SpO2:  [93 %-97 %] 93 % (04/19 0500) Weight:  [182 lb 12.2 oz (82.9 kg)] 182 lb 12.2 oz (82.9 kg) (04/19 0500) Last BM Date: 01/07/12  Intake/Output from previous day: 04/18 0701 - 04/19 0700 In: 2180 [P.O.:480; IV Piggyback:1700] Out: 5 [Urine:5] Intake/Output this shift:    PE: Abd: soft, minimally tender, +BS, ND  Lab Results:   Basename 01/06/12 0510 01/05/12 1322  WBC 8.2 10.0  HGB 12.2 13.5  HCT 36.2 38.9  PLT 244 268   BMET  Basename 01/07/12 0500 01/06/12 0510  NA 140 141  K 3.6 3.5  CL 106 106  CO2 24 24  GLUCOSE 85 98  BUN 7 8  CREATININE 0.58 0.62  CALCIUM 8.5 8.7   PT/INR  Basename 01/06/12 0510  LABPROT 14.0  INR 1.06   CMP     Component Value Date/Time   NA 140 01/07/2012 0500   K 3.6 01/07/2012 0500   CL 106 01/07/2012 0500   CO2 24 01/07/2012 0500   GLUCOSE 85 01/07/2012 0500   BUN 7 01/07/2012 0500   CREATININE 0.58 01/07/2012 0500   CALCIUM 8.5 01/07/2012 0500   PROT 5.9* 01/06/2012 0510   ALBUMIN 3.2* 01/06/2012 0510   AST 13 01/06/2012 0510   ALT 19 01/06/2012 0510   ALKPHOS 67 01/06/2012 0510   BILITOT 0.7 01/06/2012 0510   GFRNONAA >90 01/07/2012 0500   GFRAA >90 01/07/2012 0500   Lipase     Component Value Date/Time   LIPASE 21 01/05/2012 1322       Studies/Results: No results found.  Anti-infectives: Anti-infectives     Start     Dose/Rate Route Frequency Ordered Stop   01/05/12 1900   ciprofloxacin (CIPRO) IVPB 400 mg        400 mg 200 mL/hr over 60 Minutes Intravenous Every 12  hours 01/05/12 1858     01/05/12 1900   metroNIDAZOLE (FLAGYL) IVPB 500 mg        500 mg 100 mL/hr over 60 Minutes Intravenous Every 8 hours 01/05/12 1858     01/05/12 1600   ciprofloxacin (CIPRO) IVPB 400 mg  Status:  Discontinued        400 mg 200 mL/hr over 60 Minutes Intravenous  Once 01/05/12 1554 01/05/12 2111   01/05/12 1600   metroNIDAZOLE (FLAGYL) IVPB 500 mg  Status:  Discontinued        500 mg 100 mL/hr over 60 Minutes Intravenous  Once 01/05/12 1554 01/05/12 2111           Assessment/Plan  1. Diverticulitis with small abscess  Plan: 1. Advance to low fiber diet 2. Nutrition consult per pt request 3. Diflucan per pt request for yeast infection 4. can switch to po augmentin for 10-14 day course 5. If pt tolerates solid diet with no problems, suspect she is stable for discharge later this afternoon or tomorrow.  Will follow.  6. Will get follow up set up and repeat CT scan  in a couple of weeks  LOS: 3 days    Shelise Maron E 01/08/2012

## 2012-01-08 NOTE — Discharge Summary (Signed)
HOSPITAL DISCHARGE SUMMARY  Gloria Bell  MRN: [id number]  DOB:03-07-1951  Date of Admission: 01/05/2012 Date of Discharge: 01/08/2012         LOS: 3 days   Attending Physician:  Clydia Llano A  Patient's PCP:  Conchita Paris, MD, MD  Consults: General surgery   Discharge Diagnoses: Present on Admission:  .Diverticulitis .HTN (hypertension) .Intra-abdominal abscess   Medication List  As of 01/08/2012  3:23 PM   TAKE these medications         acetaminophen 500 MG tablet   Commonly known as: TYLENOL   Take 500 mg by mouth every 6 (six) hours as needed. For pain.      amoxicillin-clavulanate 875-125 MG per tablet   Commonly known as: AUGMENTIN   Take 1 tablet by mouth 2 (two) times daily.      losartan 50 MG tablet   Commonly known as: COZAAR   Take 50 mg by mouth daily.      metoprolol succinate 25 MG 24 hr tablet   Commonly known as: TOPROL-XL   Take 25 mg by mouth daily.      polyethylene glycol packet   Commonly known as: MIRALAX / GLYCOLAX   Take 17 g by mouth daily.             Brief Admission History: 61 year old with PMH significant for diverticulosis, HTN presents to ED complaining of worsening left lower quadrant abdominal pain that started 6 days prior to admission, describe pain sharp "rock, flame, hot". She relates pain is 10/10 in intensity. She denies nausea, vomiting, diarrhea. Had BM the day prior to admission. She has some chills.  Hospital Course: Present on Admission:  .Diverticulitis .HTN (hypertension) .Intra-abdominal abscess  1. Acute sigmoid diverticulitis: As mentioned above patient came in with left lower quadrant abdominal pain, she had 10/10 pain intensity, she had diarrhea she denies any nausea or vomiting. Evaluation with CT scan in the emergency department showed acute diverticulitis with probable 2 cm abscess within the wall of the sigmoid colon. General surgery was consulted, and they recommended to treat conservatively by  putting patient on bowel rest was n.p.o., start Cipro and Flagyl and follow patient clinically. Patient did very well and the denies any fever chills the very next day and she was asking about food. Her diet upgraded to clear liquids and then to low fiber diet. Patient tolerated diet very well general surgery recommended to discharge patient on Augmentin for 10 more days. Patient will be scheduled from [facility] surgery office for abdominal CT scan to followup on the diverticulitis/intra-abdominal abscess.  2. Hypertension: This was very controlled during this hospital stay, preadmission medication was continued.   Day of Discharge BP 144/80  Pulse 78  Temp(Src) 98.2 F (36.8 C) (Oral)  Resp 20  Ht 5\' 6"  (1.676 m)  Wt 82.9 kg (182 lb 12.2 oz)  BMI 29.50 kg/m2  SpO2 98% Physical Exam: GEN: No acute distress, cooperative with exam PSYCH: alert and oriented x4; does not appear anxious does not appear depressed; affect is normal  HEENT: Mucous membranes pink and anicteric;  Mouth: without oral thrush or lesions Eyes: PERRLA; EOM intact;  Neck: no cervical lymphadenopathy nor thyromegaly or carotid bruit; no JVD;  CHEST WALL: No tenderness, symmetrical to breathing bilaterally CHEST: Normal respiration, clear to auscultation bilaterally  HEART: Regular rate and rhythm; no murmurs, rubs or gallops, S1 and S2 heard  BACK: No kyphosis or scoliosis; no CVA tenderness  ABDOMEN:  soft non-tender;  no masses, no organomegaly, normal abdominal bowel sounds; no pannus; no intertriginous candida.  EXTREMITIES: No bone or joint deformity; no edema; no ulcerations.  PULSES: 2+ and symmetric, neurovascularity is intact SKIN: Normal hydration no rash or ulceration, no flushing or suspicious lesions  CNS: Cranial nerves 2-12 grossly intact no focal neurologic deficit, coordination is intact gait not tested  CT ABDOMEN AND PELVIS WITH CONTRAST done on 01/05/2012  Technique: Multidetector CT  imaging of the abdomen and pelvis was  performed following the standard protocol during bolus  administration of intravenous contrast.  Contrast: 80mL OMNIPAQUE IOHEXOL 300 MG/ML SOLN  Comparison: 06/18/2006   Findings: Scarring or atelectasis in the lung bases. Heart is  upper limits normal in size. No effusions.  Prior cholecystectomy. Liver, spleen, pancreas, adrenals, kidneys  are unremarkable. There is descending colonic and sigmoid  diverticulosis. Inflammatory changes noted around the sigmoid  colon compatible with active diverticulitis. There is a fluid  collection noted possibly within the wall of the sigmoid colon.  While this could represent a fluid-filled diverticulum, I cannot  exclude an intramural abscess measuring 2 cm. There is adjacent  secondary wall thickening in the adjacent bladder.  Small bowel is decompressed. No free fluid or free air. Shotty  retroperitoneal lymph nodes, none pathologically enlarged.  No acute bony abnormality.  IMPRESSION:  Changes of sigmoid diverticulitis. Possible 2 cm abscess within  the wall of the sigmoid colon.  Superior wall thickening in the adjacent bladder wall.  Prior cholecystectomy.  Original Report Authenticated By: Cyndie Chime, M.D.  Disposition: Home   Follow-up Appts: Discharge Orders    Future Appointments: Provider: Department: Dept Phone: Center:   01/21/2012 11:00 AM [facility] [phone] [facility]   02/02/2012 9:40 AM Robyne Askew, MD [facility] [phone] None     Future Orders Please Complete By Expires   Diet - low sodium heart healthy      Increase activity slowly         Follow-up Information    Follow up with Conchita Paris, MD.   Contact information:   [address] [place] [address] [phone]       Follow up with Robyne Askew, MD on 02/02/2012. (9:40 AM)    Contact information:   [facility] [address]  [address] [place] [address] [phone]          I spent 40 minutes completing paperwork and coordinating discharge efforts.  SignedClydia Llano A 01/08/2012, 3:23 PM

## 2012-01-08 NOTE — Telephone Encounter (Signed)
Addended byLittie Deeds on: 01/08/2012 08:04 AM   Modules accepted: Orders

## 2012-01-08 NOTE — Plan of Care (Signed)
Problem: Food- and Nutrition-Related Knowledge Deficit (NB-1.1) Goal: Nutrition education Formal process to instruct or train a patient/client in a skill or to impart knowledge to help patients/clients voluntarily manage or modify food choices and eating behavior to maintain or improve health.  Outcome: Completed/Met Date Met:  01/08/12 RD consulted to talk with pt about diet for diverticulitis. Pt has had diverticulosis for several years per pt report. States that she has a "lazy colon" and follows a low fiber diet with daily Miralax to prevent constipation. RD went over low fiber diet with pt and provided a hand out. RD answered questions for pt. No additional nutrition interventions at this time.   Gloria Bell

## 2012-01-08 NOTE — Telephone Encounter (Signed)
Tresa Endo called this am per Dr Carolynne Edouard that this patient needs a CT abdomen and pelvis with contrast in two weeks to evaluate diverticulitis with possible abscess and an appt with Dr Carolynne Edouard for follow up after.

## 2012-01-11 ENCOUNTER — Telehealth (INDEPENDENT_AMBULATORY_CARE_PROVIDER_SITE_OTHER): Payer: Self-pay | Admitting: General Surgery

## 2012-01-11 NOTE — Telephone Encounter (Signed)
Talked with the pt about scheduling her CT scan.  She will call them and reschedule the apt to one that is convenient for her.  I went ahead and took the CT order to [place].

## 2012-01-11 NOTE — Telephone Encounter (Signed)
Message copied by Littie Deeds on Mon Jan 11, 2012  9:19 AM ------      Message from: Littie Deeds      Created: Fri Jan 08, 2012  8:32 AM       Call pt w/ apt time after she is discharged.

## 2012-01-11 NOTE — Telephone Encounter (Signed)
Spoke with pt to let her know about her f/u apt on 02/03/12

## 2012-01-18 ENCOUNTER — Encounter: Payer: Self-pay | Admitting: Gastroenterology

## 2012-01-21 ENCOUNTER — Ambulatory Visit
Admission: RE | Admit: 2012-01-21 | Discharge: 2012-01-21 | Disposition: A | Discharge status: Home / Self Care | Payer: BC Managed Care – PPO | Source: Ambulatory Visit | Attending: General Surgery | Admitting: General Surgery

## 2012-01-21 DIAGNOSIS — K5792 Diverticulitis of intestine, part unspecified, without perforation or abscess without bleeding: Secondary | ICD-10-CM

## 2012-01-21 MED ORDER — IOHEXOL 300 MG/ML  SOLN
100.0000 mL | Freq: Once | INTRAMUSCULAR | Status: AC | PRN
  Administered 2012-01-21: 100 mL via INTRAVENOUS

## 2012-01-22 ENCOUNTER — Telehealth (INDEPENDENT_AMBULATORY_CARE_PROVIDER_SITE_OTHER): Payer: Self-pay | Admitting: General Surgery

## 2012-01-22 NOTE — Progress Notes (Signed)
Annie completed. 

## 2012-01-22 NOTE — Telephone Encounter (Signed)
Called pt and gave her the results over the phone and told pt to keep her appt on the 02-03-12

## 2012-02-02 ENCOUNTER — Encounter (INDEPENDENT_AMBULATORY_CARE_PROVIDER_SITE_OTHER): Payer: Self-pay | Admitting: General Surgery

## 2012-02-03 ENCOUNTER — Encounter (INDEPENDENT_AMBULATORY_CARE_PROVIDER_SITE_OTHER): Payer: Self-pay | Admitting: General Surgery

## 2012-02-03 ENCOUNTER — Ambulatory Visit (INDEPENDENT_AMBULATORY_CARE_PROVIDER_SITE_OTHER): Payer: BC Managed Care – PPO | Admitting: General Surgery

## 2012-02-03 VITALS — BP 142/90 | HR 80 | Temp 98.5°F | Resp 14 | Ht 66.0 in | Wt 174.8 lb

## 2012-02-03 DIAGNOSIS — K5732 Diverticulitis of large intestine without perforation or abscess without bleeding: Secondary | ICD-10-CM

## 2012-02-03 DIAGNOSIS — K5792 Diverticulitis of intestine, part unspecified, without perforation or abscess without bleeding: Secondary | ICD-10-CM

## 2012-02-03 NOTE — Patient Instructions (Signed)
Continue taking miralax every day Take a probiotic every day Avoid all seeds and nuts

## 2012-02-03 NOTE — Progress Notes (Signed)
Subjective:     Patient ID: Gloria Bell, female   DOB: 06-12-1951, 61 y.o.   MRN: [id number]  HPI The patient is a 61 year old white female who was recently hospitalized with an episode of diverticulitis. She was treated with antibiotics and rapidly improved. It was difficult to tell on her original CT if she had a fluid collection near the colon but on subsequent CT the inflammation had completely resolved and there was no evidence of fluid. She has been off antibiotics for 3 weeks and in doing well. She denies any abdominal pain. Her appetite is good and her bowels are working normally for her. She does require MiraLax every day to have a normal bowel movement.  Review of Systems  Constitutional: Negative.   HENT: Negative.   Eyes: Negative.   Respiratory: Negative.   Cardiovascular: Negative.   Gastrointestinal: Negative.   Genitourinary: Negative.   Musculoskeletal: Negative.   Skin: Negative.   Neurological: Negative.   Hematological: Negative.   Psychiatric/Behavioral: Negative.        Objective:   Physical Exam  Constitutional: She is oriented to person, place, and time. She appears well-developed and well-nourished.  HENT:  Head: Normocephalic and atraumatic.  Eyes: Conjunctivae and EOM are normal. Pupils are equal, round, and reactive to light.  Neck: Normal range of motion. Neck supple.  Cardiovascular: Normal rate, regular rhythm and normal heart sounds.   Pulmonary/Chest: Effort normal and breath sounds normal.  Abdominal: Soft. Bowel sounds are normal.  Musculoskeletal: Normal range of motion.  Neurological: She is alert and oriented to person, place, and time.  Skin: Skin is warm and dry.  Psychiatric: She has a normal mood and affect. Her behavior is normal.       Assessment:     The patient appears to have had her first episode of an uncomplicated diverticulitis. She is asymptomatic now.    Plan:     Because this is her first episode and she is  recovered so quickly articular be reasonable to modify her diet, and a probiotic, and observe her and see how she is going to do. She'll continue to take MiraLax every day. We will plan to see her back in about 3 months to check her progress. She agrees to call us if her pain recurs in any way

## 2012-02-17 ENCOUNTER — Telehealth (INDEPENDENT_AMBULATORY_CARE_PROVIDER_SITE_OTHER): Payer: Self-pay | Admitting: General Surgery

## 2012-02-17 ENCOUNTER — Other Ambulatory Visit (INDEPENDENT_AMBULATORY_CARE_PROVIDER_SITE_OTHER): Payer: Self-pay | Admitting: General Surgery

## 2012-02-17 DIAGNOSIS — K5792 Diverticulitis of intestine, part unspecified, without perforation or abscess without bleeding: Secondary | ICD-10-CM

## 2012-02-17 DIAGNOSIS — R1032 Left lower quadrant pain: Secondary | ICD-10-CM

## 2012-02-17 NOTE — Telephone Encounter (Signed)
Spoke with patient and informed her that her labs came back normal with no elevated WBC.  Dr. Billey Chang next suggestions was for her to do a CT scan if she wanted.  The patient agreed and said that she would like to do the CT scan because "it just ins'nt feeling right".  The orders were put in epic.

## 2012-02-17 NOTE — Telephone Encounter (Signed)
PT CALLED TO REPORT THAT SHE THINKS SHE MAY BE HAVING EARLY SYMPTOMS OF DIVERTICULITIS, LEFT LOWER QUADRANT INTERMITTENT PAIN STARTING YESTERDAY. BOWELS ARE MOVING AND NO NAUSEA OR VOMITING. I REPORTED THIS TO DR. Carolynne Edouard. HE SAID TO ORDER A CBC WITH DIFF TODAY, STAT. IF ELEVATED WBC THEN ORDER CT SCAN OF ABDOMEN/PELVIS/ PT INSTRUCTED TO STAY ON LIQUID DIET FOR NEXT 24 HOURS AND INSTRUCTED TO HAVE BLOOD WORK DRAWN THIS AM TO EVALUATE NEXT STEP/GY

## 2012-02-18 ENCOUNTER — Other Ambulatory Visit: Payer: BC Managed Care – PPO

## 2012-02-18 ENCOUNTER — Telehealth (INDEPENDENT_AMBULATORY_CARE_PROVIDER_SITE_OTHER): Payer: Self-pay | Admitting: General Surgery

## 2012-02-18 NOTE — Telephone Encounter (Signed)
Pt calling with update, as requested.  She cancelled a CT scheduled for pain in her side; the pain "is 100% gone now."  Pt attributes the pain to a virus that is much better this afternoon and the pain is gone.

## 2012-02-18 NOTE — Telephone Encounter (Signed)
MS Ballester CALLED TO INFORM DR. TOTH THAT SHE CANCELLED HER CT SCAN SCHEDULED FOR THIS AM. SHE WAS UP EARLY THIS AM WITH DIARRHEA AND NAUSEA. SHE THINKS SHE HAS A STOMACH VIRUS. SHE WAS IN CONTACT WITH A SICK PERSON AT HE DR'S OFFICE RECENTLY. ALSO HER LEFT LOWER QUADRANT PAIN IS 90% BETTER. SHE WILL CONTINUE TAKING LIQUIDS AND SUPPLEMENTS. I INSTRUCTED HER TO CALL OUR OFFICE TOMORROW TO GIVE [place] AN UPDATE RE HER CONDITION. SHE SAID SHE WOULD CALL.Lanier Prude

## 2012-04-07 ENCOUNTER — Encounter (INDEPENDENT_AMBULATORY_CARE_PROVIDER_SITE_OTHER): Payer: Self-pay

## 2012-04-14 ENCOUNTER — Other Ambulatory Visit (INDEPENDENT_AMBULATORY_CARE_PROVIDER_SITE_OTHER): Payer: Self-pay | Admitting: General Surgery

## 2012-04-14 ENCOUNTER — Telehealth (INDEPENDENT_AMBULATORY_CARE_PROVIDER_SITE_OTHER): Payer: Self-pay | Admitting: General Surgery

## 2012-04-14 ENCOUNTER — Ambulatory Visit
Admission: RE | Admit: 2012-04-14 | Discharge: 2012-04-14 | Disposition: A | Discharge status: Home / Self Care | Payer: BC Managed Care – PPO | Source: Ambulatory Visit | Attending: General Surgery | Admitting: General Surgery

## 2012-04-14 DIAGNOSIS — K5792 Diverticulitis of intestine, part unspecified, without perforation or abscess without bleeding: Secondary | ICD-10-CM

## 2012-04-14 MED ORDER — IOHEXOL 300 MG/ML  SOLN
100.0000 mL | Freq: Once | INTRAMUSCULAR | Status: AC | PRN
  Administered 2012-04-14: 100 mL via INTRAVENOUS

## 2012-04-14 NOTE — Telephone Encounter (Signed)
Pt was instructed by Dr. Carolynne Edouard to call him if she has pain again---which she is doing.  Paged and updated Dr. Carolynne Edouard who ordered CBC with diff, BMET, CT of abdomen and pelvis and an office visit to evaluate.  Pt understands the physician is in the OR today, so may be a while to hear back from him.

## 2012-04-14 NOTE — Telephone Encounter (Signed)
Pt called stating she was having symptoms of diverticulitis again.  [name] paged Dr. Carolynne Edouard and he said he wanted her to have a CT abd pelvis, CBC w/ diff, and BMET.  I put the orders in and called the patient to let her know that I faxed the orders over to [facility] and she can head over to have that done and also that her CT appt is set up for 3:45 today.  I explained to her that she needed to pick up contrast from the [facility] building.  The patient was fine with this.  I also have made her a f/u appt to see Dr. Carolynne Edouard on 7/29 at 3:30.  I also sent a reminder card in the mail.

## 2012-04-15 LAB — CBC WITH DIFFERENTIAL/PLATELET
Basophils Relative: 0 % (ref 0–1)
Eosinophils Absolute: 0 10*3/uL (ref 0.0–0.7)
Eosinophils Relative: 0 % (ref 0–5)
MCH: 31.8 pg (ref 26.0–34.0)
MCHC: 35.7 g/dL (ref 30.0–36.0)
MCV: 89.2 fL (ref 78.0–100.0)
Monocytes Relative: 5 % (ref 3–12)
Neutrophils Relative %: 61 % (ref 43–77)
Platelets: 285 10*3/uL (ref 150–400)

## 2012-04-15 LAB — BASIC METABOLIC PANEL
CO2: 24 mEq/L (ref 19–32)
Calcium: 9.8 mg/dL (ref 8.4–10.5)
Creat: 0.7 mg/dL (ref 0.50–1.10)
Glucose, Bld: 97 mg/dL (ref 70–99)
Sodium: 139 mEq/L (ref 135–145)

## 2012-04-18 ENCOUNTER — Ambulatory Visit (INDEPENDENT_AMBULATORY_CARE_PROVIDER_SITE_OTHER): Payer: BC Managed Care – PPO | Admitting: General Surgery

## 2012-04-18 ENCOUNTER — Encounter (INDEPENDENT_AMBULATORY_CARE_PROVIDER_SITE_OTHER): Payer: Self-pay | Admitting: General Surgery

## 2012-04-18 VITALS — BP 144/88 | HR 84 | Temp 98.5°F | Resp 16 | Ht 66.0 in | Wt 178.0 lb

## 2012-04-18 DIAGNOSIS — R1032 Left lower quadrant pain: Secondary | ICD-10-CM | POA: Insufficient documentation

## 2012-04-18 DIAGNOSIS — R52 Pain, unspecified: Secondary | ICD-10-CM

## 2012-04-18 NOTE — Progress Notes (Signed)
Subjective:     Patient ID: Gloria Bell, female   DOB: Apr 12, 1951, 61 y.o.   MRN: [id number]  HPI The patient is a 61 year old white female who was hospitalized about 2 months ago with an episode of diverticulitis of her sigmoid colon. She improved rapidly on antibiotics. A followup scan showed that her diverticulitis had resolved. There was no evidence of perforation. She was doing well until a few days ago when she developed a left-sided abdominal pain. She states it did feel different than the previous pain she had had. She denied any fevers or chills. She chronically has problems moving her bowels but has not noticed a change in her normal bowel function. We sent her for a CBC and a CT scan. Her white count was normal. Her CT scan showed no evidence of acute inflammation.  Review of Systems  Constitutional: Negative.   HENT: Negative.   Eyes: Negative.   Respiratory: Negative.   Cardiovascular: Negative.   Gastrointestinal: Positive for abdominal pain.  Genitourinary: Negative.   Musculoskeletal: Negative.   Skin: Negative.   Neurological: Negative.   Hematological: Negative.   Psychiatric/Behavioral: Negative.        Objective:   Physical Exam  Constitutional: She is oriented to person, place, and time. She appears well-developed and well-nourished.  HENT:  Head: Normocephalic and atraumatic.  Eyes: Conjunctivae and EOM are normal. Pupils are equal, round, and reactive to light.  Neck: Normal range of motion. Neck supple.  Cardiovascular: Normal rate, regular rhythm and normal heart sounds.   Pulmonary/Chest: Effort normal and breath sounds normal.  Abdominal: Soft. Bowel sounds are normal.       She has some mild left-sided abdominal pain. No palpable mass. No guarding or peritonitis.  Musculoskeletal: Normal range of motion.  Neurological: She is alert and oriented to person, place, and time.  Skin: Skin is warm and dry.  Psychiatric: She has a normal mood and affect.  Her behavior is normal.       Assessment:     The patient has developed new onset left-sided abdominal pain without other evidence for recurrent diverticulitis. At this point is not clear what the source of her pain is. She has chronic motility issues with her colon and is followed by Dr. Randa Evens for this.    Plan:     We will plan to refer her back to Dr. Randa Evens to get a second opinion on what the possible etiology of her pain could be. We'll plan to see her back in about 3 or 4 weeks to check her progress. I do not feel strongly about treating her with antibiotics just yet.

## 2012-04-19 ENCOUNTER — Telehealth (INDEPENDENT_AMBULATORY_CARE_PROVIDER_SITE_OTHER): Payer: Self-pay | Admitting: General Surgery

## 2012-04-19 NOTE — Telephone Encounter (Signed)
Gloria Bell called from Dr Dulce Sellar office stated that Mrs Gloria Bell would not come in this week for a appt to see Dr Dulce Sellar and will wait until September to get a call from Dr Ramon Dredge office for a appt to come back to see him

## 2012-05-20 ENCOUNTER — Encounter (INDEPENDENT_AMBULATORY_CARE_PROVIDER_SITE_OTHER): Payer: BC Managed Care – PPO | Admitting: General Surgery

## 2012-06-16 ENCOUNTER — Encounter (INDEPENDENT_AMBULATORY_CARE_PROVIDER_SITE_OTHER): Payer: BC Managed Care – PPO | Admitting: General Surgery

## 2012-07-26 ENCOUNTER — Ambulatory Visit: Payer: Self-pay | Admitting: Cardiology

## 2012-08-11 ENCOUNTER — Encounter (INDEPENDENT_AMBULATORY_CARE_PROVIDER_SITE_OTHER): Payer: BC Managed Care – PPO | Admitting: General Surgery

## 2012-08-29 ENCOUNTER — Encounter (INDEPENDENT_AMBULATORY_CARE_PROVIDER_SITE_OTHER): Payer: BC Managed Care – PPO | Admitting: General Surgery

## 2012-09-01 ENCOUNTER — Encounter (HOSPITAL_COMMUNITY): Payer: Self-pay

## 2012-09-01 ENCOUNTER — Emergency Department (HOSPITAL_COMMUNITY): Payer: BC Managed Care – PPO

## 2012-09-01 ENCOUNTER — Emergency Department (HOSPITAL_COMMUNITY)
Admission: EM | Admit: 2012-09-01 | Discharge: 2012-09-01 | Disposition: A | Discharge status: Home / Self Care | Payer: BC Managed Care – PPO | Attending: Emergency Medicine | Admitting: Emergency Medicine

## 2012-09-01 DIAGNOSIS — Z8739 Personal history of other diseases of the musculoskeletal system and connective tissue: Secondary | ICD-10-CM | POA: Insufficient documentation

## 2012-09-01 DIAGNOSIS — E785 Hyperlipidemia, unspecified: Secondary | ICD-10-CM | POA: Insufficient documentation

## 2012-09-01 DIAGNOSIS — Z79899 Other long term (current) drug therapy: Secondary | ICD-10-CM | POA: Insufficient documentation

## 2012-09-01 DIAGNOSIS — I10 Essential (primary) hypertension: Secondary | ICD-10-CM | POA: Insufficient documentation

## 2012-09-01 DIAGNOSIS — Z8719 Personal history of other diseases of the digestive system: Secondary | ICD-10-CM | POA: Insufficient documentation

## 2012-09-01 DIAGNOSIS — R109 Unspecified abdominal pain: Secondary | ICD-10-CM

## 2012-09-01 HISTORY — DX: Diverticulosis of intestine, part unspecified, without perforation or abscess without bleeding: K57.90

## 2012-09-01 HISTORY — DX: Diverticulitis of intestine, part unspecified, without perforation or abscess without bleeding: K57.92

## 2012-09-01 LAB — CBC WITH DIFFERENTIAL/PLATELET
Basophils Absolute: 0 10*3/uL (ref 0.0–0.1)
Basophils Relative: 0 % (ref 0–1)
Eosinophils Absolute: 0 10*3/uL (ref 0.0–0.7)
Eosinophils Relative: 0 % (ref 0–5)
HCT: 40.6 % (ref 36.0–46.0)
Hemoglobin: 14 g/dL (ref 12.0–15.0)
Lymphocytes Relative: 31 % (ref 12–46)
Lymphs Abs: 2.6 10*3/uL (ref 0.7–4.0)
MCH: 30.9 pg (ref 26.0–34.0)
MCHC: 34.5 g/dL (ref 30.0–36.0)
MCV: 89.6 fL (ref 78.0–100.0)
Monocytes Absolute: 0.5 10*3/uL (ref 0.1–1.0)
Monocytes Relative: 5 % (ref 3–12)
Neutro Abs: 5.3 10*3/uL (ref 1.7–7.7)
Neutrophils Relative %: 63 % (ref 43–77)
Platelets: 262 10*3/uL (ref 150–400)
RBC: 4.53 MIL/uL (ref 3.87–5.11)
RDW: 12 % (ref 11.5–15.5)
WBC: 8.4 10*3/uL (ref 4.0–10.5)

## 2012-09-01 LAB — URINALYSIS, ROUTINE W REFLEX MICROSCOPIC
Bilirubin Urine: NEGATIVE
Glucose, UA: NEGATIVE mg/dL
Hgb urine dipstick: NEGATIVE
Ketones, ur: 15 mg/dL — AB
Leukocytes, UA: NEGATIVE
Nitrite: NEGATIVE
Protein, ur: NEGATIVE mg/dL
Specific Gravity, Urine: 1.012 (ref 1.005–1.030)
Urobilinogen, UA: 0.2 mg/dL (ref 0.0–1.0)
pH: 5 (ref 5.0–8.0)

## 2012-09-01 LAB — BASIC METABOLIC PANEL
BUN: 12 mg/dL (ref 6–23)
CO2: 22 mEq/L (ref 19–32)
Calcium: 9.6 mg/dL (ref 8.4–10.5)
Chloride: 101 mEq/L (ref 96–112)
Creatinine, Ser: 0.65 mg/dL (ref 0.50–1.10)
GFR calc Af Amer: >90 mL/min (ref >90)
GFR calc non Af Amer: >90 mL/min (ref >90)
Glucose, Bld: 118 mg/dL — ABNORMAL HIGH (ref 70–99)
Potassium: 3.6 mEq/L (ref 3.5–5.1)
Sodium: 138 mEq/L (ref 135–145)

## 2012-09-01 MED ORDER — LOSARTAN POTASSIUM 50 MG PO TABS
50.0000 mg | ORAL_TABLET | Freq: Once | ORAL | Status: AC
  Administered 2012-09-01: 50 mg via ORAL
  Filled 2012-09-01 (×2): qty 1

## 2012-09-01 MED ORDER — HYDROMORPHONE HCL PF 1 MG/ML IJ SOLN
1.0000 mg | Freq: Once | INTRAMUSCULAR | Status: AC
  Administered 2012-09-01: 1 mg via INTRAVENOUS
  Filled 2012-09-01: qty 1

## 2012-09-01 MED ORDER — SODIUM CHLORIDE 0.9 % IV BOLUS (SEPSIS)
1000.0000 mL | Freq: Once | INTRAVENOUS | Status: AC
  Administered 2012-09-01: 1000 mL via INTRAVENOUS

## 2012-09-01 MED ORDER — IOHEXOL 300 MG/ML  SOLN
100.0000 mL | Freq: Once | INTRAMUSCULAR | Status: AC | PRN
  Administered 2012-09-01: 100 mL via INTRAVENOUS

## 2012-09-01 MED ORDER — DOCUSATE SODIUM 100 MG PO CAPS: 100.0000 mg | ORAL_CAPSULE | Freq: Two times a day (BID) | ORAL | Status: DC

## 2012-09-01 MED ORDER — MORPHINE SULFATE 4 MG/ML IJ SOLN
4.0000 mg | Freq: Once | INTRAMUSCULAR | Status: AC
  Administered 2012-09-01: 4 mg via INTRAVENOUS
  Filled 2012-09-01: qty 1

## 2012-09-01 MED ORDER — ONDANSETRON HCL 4 MG/2ML IJ SOLN
4.0000 mg | Freq: Once | INTRAMUSCULAR | Status: AC
  Administered 2012-09-01: 4 mg via INTRAVENOUS
  Filled 2012-09-01: qty 2

## 2012-09-01 MED ORDER — METOPROLOL TARTRATE 25 MG PO TABS
25.0000 mg | ORAL_TABLET | Freq: Once | ORAL | Status: AC
  Administered 2012-09-01: 25 mg via ORAL
  Filled 2012-09-01: qty 1

## 2012-09-01 MED ORDER — OXYCODONE-ACETAMINOPHEN 5-325 MG PO TABS: 1.0000 | ORAL_TABLET | ORAL | Status: DC | PRN

## 2012-09-01 MED ORDER — IOHEXOL 300 MG/ML  SOLN
20.0000 mL | INTRAMUSCULAR | Status: AC
  Administered 2012-09-01 (×2): 20 mL via ORAL

## 2012-09-01 NOTE — ED Notes (Signed)
Pt discharged. In room waiting on ride to take her home.

## 2012-09-01 NOTE — ED Notes (Signed)
MD at bedside. 

## 2012-09-01 NOTE — ED Notes (Signed)
Pt states she started having pain on the right side of her abd and right flank pain. States the pain is worse than the pain she had on the left side in April from diverticulitis.

## 2012-09-01 NOTE — ED Notes (Signed)
Returned from CT.

## 2012-09-01 NOTE — ED Notes (Signed)
Patient transported to CT 

## 2012-09-01 NOTE — ED Provider Notes (Signed)
History    61 year old female with abdominal pain. Right sided. Patient states it feels like "flames" radiating up from her right ankle region and her right flank and back. Gradual onset about 6 days ago. Constant and progressive. No appreciable exacerbating relieving factors. Patient reports a history of diverticulitis and stated that current symptoms feel similar. No fevers or chills. Nausea but no vomiting. No urinary complaints. No diarrhea. No chest pain or shortness of breath. Abdominal surgical history significant for cholecystectomy, hysterectomy and appendectomy. No recent procedures.   CSN: [id number]  Arrival date & time 09/01/12  1410   First MD Initiated Contact with Patient 09/01/12 1641      Chief Complaint  Patient presents with  . Abdominal Pain    (Consider location/radiation/quality/duration/timing/severity/associated sxs/prior treatment) HPI  Past Medical History  Diagnosis Date  . Hypertension   . Arthritis   . Hyperlipidemia   . Osteoporosis   . Diverticulitis   . Diverticulosis     Past Surgical History  Procedure Date  . Elbow fracture surgery   . Knee surgery   . Other surgical history     had to be on percocet for a year and it slowed down her "gut"   . Cholecystectomy   . Abdominal hysterectomy   . Appendectomy     Family History  Problem Relation Age of Onset  . Heart disease Father     heart attack  . Diabetes Father   . Cancer Paternal Grandfather     prostate    History  Substance Use Topics  . Smoking status: Never Smoker   . Smokeless tobacco: Not on file  . Alcohol Use: No    OB History    Grav Para Term Preterm Abortions TAB SAB Ect Mult Living                  Review of Systems   Review of symptoms negative unless otherwise noted in HPI.   Allergies  Ampicillin; Cephalexin; Codeine; Flagyl; Sulfa antibiotics; and Vicodin  Home Medications   Current Outpatient Rx  Name  Route  Sig  Dispense  Refill  .  ACETAMINOPHEN 500 MG PO TABS   Oral   Take 500 mg by mouth every 6 (six) hours as needed. For pain.         Marland Kitchen LOSARTAN POTASSIUM 50 MG PO TABS   Oral   Take 50 mg by mouth daily.         Marland Kitchen METOPROLOL SUCCINATE ER 25 MG PO TB24   Oral   Take 25 mg by mouth daily.         Marland Kitchen POLYETHYLENE GLYCOL 3350 PO PACK   Oral   Take 17 g by mouth daily.           BP 128/108  Pulse 96  Temp 98.2 F (36.8 C) (Oral)  Resp 20  SpO2 95%  Physical Exam  Nursing note and vitals reviewed. Constitutional: She appears well-developed and well-nourished. No distress.  HENT:  Head: Normocephalic and atraumatic.  Eyes: Conjunctivae normal are normal. Right eye exhibits no discharge. Left eye exhibits no discharge.  Neck: Neck supple.  Cardiovascular: Normal rate, regular rhythm and normal heart sounds.  Exam reveals no gallop and no friction rub.   No murmur heard. Pulmonary/Chest: Effort normal and breath sounds normal. No respiratory distress.  Abdominal: Soft. She exhibits no distension. There is tenderness. There is guarding.       Tenderness the right lower quadrant with  voluntary guarding. No rebound tenderness. Does not appear to be distended. Overlying skin of abdomen/inguinal region is grossly normal to inspection.  Genitourinary:       No CVA tenderness  Musculoskeletal: She exhibits no edema and no tenderness.  Neurological: She is alert.  Skin: Skin is warm and dry.  Psychiatric: She has a normal mood and affect. Her behavior is normal. Thought content normal.    ED Course  Procedures (including critical care time)  Labs Reviewed  BASIC METABOLIC PANEL - Abnormal; Notable for the following:    Glucose, Bld 118 (*)     All other components within normal limits  URINALYSIS, ROUTINE W REFLEX MICROSCOPIC - Abnormal; Notable for the following:    Ketones, ur 15 (*)     All other components within normal limits  CBC WITH DIFFERENTIAL   Ct Abdomen Pelvis W  Contrast  09/01/2012  *RADIOLOGY REPORT*  Clinical Data: Right-sided abdominal pain.  Right-sided flank pain.  CT ABDOMEN AND PELVIS WITH CONTRAST  Technique:  Multidetector CT imaging of the abdomen and pelvis was performed following the standard protocol during bolus administration of intravenous contrast.  Contrast: OMNIPAQUE IOHEXOL 300 MG/ML  SOLN  Comparison: 04/14/2012.  Findings: Lung Bases: Mild respiratory motion at the bases. Dependent atelectasis.  Lingular atelectasis and / or scarring.  Liver:  Colonic interposition between the liver margin of the hemidiaphragm incidentally noted.  No mass lesions.  Spleen:  Normal.  Gallbladder:  Cholecystectomy.  Common bile duct:  Normal.  Pancreas:  Normal.  Adrenal glands:  Normal.  Kidneys:  Scattered tiny retroperitoneal calcifications compatible with old granulomatous disease.  Normal renal enhancement.  Normal delayed excretion of contrast.  Left inferior pole renal cortical scarring.  No calculi.  Ureters normal.  Stomach:  Distended.  No inflammatory changes.  Small bowel:  Normal.  No mesenteric adenopathy.  No inflammatory changes.  Minuscule paraumbilical fat-containing hernia.  Colon:   Severe colonic diverticulosis.  There is no diverticulitis.  Appendix not identified, surgically absent.  Small area of old epiploic appendagitis present in the right lower quadrant.  There are no right lower quadrant inflammatory changes.  Pelvic Genitourinary:  Hysterectomy.  No free fluid.  No pelvic adenopathy.  Urinary bladder appears normal.  Bones:  No aggressive osseous lesions.  Unchanged faint sclerotic lesion in the medial right iliac bone.  Sacralization of the right L5 transverse process.  Lumbar spondylosis and facet arthrosis.  Vasculature: Normal.  IMPRESSION:  1.  No acute abnormality. 2.  Cholecystectomy and hysterectomy. 3.  Severe colonic diverticulosis without diverticulitis.   Original Report Authenticated By: Andreas Newport, M.D.      1.  Abdominal pain       MDM  61 year old female with lower abdominal pain. Etiology is not clear, but does not appear to be emergent. Patient's workup fairly unremarkable. Labs normal. Urinalysis is not suggestive of infection. CT of the abdomen and pelvis shows severe diverticular disease, but no diverticulitis or other pathology. She is hemodynamically stable. She's afebrile. No vomiting. Consider shingles with burning quality of pain that patient is prescribing, but no concerning skin lesions noted and would expect to see a rash with symptoms going on for 6 days at this point. No change in character of her pain range of motion of her hips. Feel she is safe for discharge at this time. Plan symptomatic treatment. Return precautions were discussed. Outpatient followup.        Raeford Razor, MD 09/01/12 2041

## 2012-09-09 ENCOUNTER — Encounter (INDEPENDENT_AMBULATORY_CARE_PROVIDER_SITE_OTHER): Payer: BC Managed Care – PPO | Admitting: General Surgery

## 2012-09-21 HISTORY — PX: HEMORRHOIDECTOMY WITH HEMORRHOID BANDING: SHX5633

## 2012-09-28 ENCOUNTER — Encounter (INDEPENDENT_AMBULATORY_CARE_PROVIDER_SITE_OTHER): Payer: BC Managed Care – PPO | Admitting: General Surgery

## 2013-01-25 ENCOUNTER — Encounter (INDEPENDENT_AMBULATORY_CARE_PROVIDER_SITE_OTHER): Payer: Self-pay | Admitting: General Surgery

## 2013-01-25 ENCOUNTER — Ambulatory Visit (INDEPENDENT_AMBULATORY_CARE_PROVIDER_SITE_OTHER): Payer: BC Managed Care – PPO | Admitting: General Surgery

## 2013-01-25 VITALS — BP 136/82 | HR 76 | Temp 98.6°F | Resp 18 | Ht 64.0 in | Wt 178.0 lb

## 2013-01-25 DIAGNOSIS — K648 Other hemorrhoids: Secondary | ICD-10-CM | POA: Insufficient documentation

## 2013-01-25 NOTE — Patient Instructions (Signed)
Plan for internal hemorrhoid banding 

## 2013-01-25 NOTE — Progress Notes (Signed)
Subjective:     Patient ID: Gloria [name], female   DOB: [birth year], 62 y.o.   MRN: [id number]  HPI We're asked to see the patient in consultation by Dr. Candelaria Stagers to evaluate her for hemorrhoids. The patient is a 62 year old white female who states she has had bleeding hemorrhoid problems for many years. Her most recent episode started in late April and she has been passing a lot of [name] with her bowel movements since then. She does have problems with constipation and requires MiraLAX every day to help combat this. She also had a history of diverticulitis a year ago but does not appear to have any flareup since then.  Review of Systems  Constitutional: Negative.   HENT: Negative.   Eyes: Negative.   Respiratory: Negative.   Cardiovascular: Negative.   Gastrointestinal: Positive for [name] in stool.  Endocrine: Negative.   Genitourinary: Negative.   Musculoskeletal: Negative.   Skin: Negative.   Allergic/Immunologic: Negative.   Neurological: Negative.   Hematological: Negative.   Psychiatric/Behavioral: Negative.        Objective:   Physical Exam  Constitutional: She is oriented to person, place, and time. She appears well-developed and well-nourished.  HENT:  Head: Normocephalic and atraumatic.  Eyes: Conjunctivae and EOM are normal. Pupils are equal, round, and reactive to light.  Neck: Normal range of motion. Neck supple.  Cardiovascular: Normal rate, regular rhythm and normal heart sounds.   Pulmonary/Chest: Effort normal and breath sounds normal.  Abdominal: Bowel sounds are normal.  Genitourinary:  On rectal exam she has some small benign-appearing external hemorrhoidal skin tags. On digital exam I cannot palpate any masses. On anoscopic exam she does have some pretty significant sized internal hemorrhoids with areas of acute irritation  Musculoskeletal: Normal range of motion.  Neurological: She is alert and oriented to person, place, and time.  Skin: Skin is warm and  dry.  Psychiatric: She has a normal mood and affect. Her behavior is normal.       Assessment:     The patient appears to have significant bleeding internal hemorrhoids. At this point I recommend starting with simpler procedures to try to get this under control. Since she could not tolerate a anus scope very well I would recommend doing an exam under anesthesia in the operating room and internal hemorrhoid banding. I have discussed with her in detail the risks and benefits of the operation as well as some of the technical aspects and she understands and wishes to proceed     Plan:     Plan for internal hemorrhoid banding in the operating room

## 2013-02-02 DIAGNOSIS — K648 Other hemorrhoids: Secondary | ICD-10-CM

## 2013-02-03 ENCOUNTER — Telehealth (INDEPENDENT_AMBULATORY_CARE_PROVIDER_SITE_OTHER): Payer: Self-pay

## 2013-02-03 NOTE — Telephone Encounter (Signed)
Gloria Bell at [facility] called stating they do not have Debucaine in stock and wanted to know if there is any replacement for this. I spoke with Dr Carolynne Edouard. Per Dr Carolynne Edouard no other rx is recommended. Pharmacy will contact other locations to locate med and advise pt of delay in fill.

## 2013-02-07 ENCOUNTER — Telehealth (INDEPENDENT_AMBULATORY_CARE_PROVIDER_SITE_OTHER): Payer: Self-pay | Admitting: *Deleted

## 2013-02-07 ENCOUNTER — Other Ambulatory Visit (INDEPENDENT_AMBULATORY_CARE_PROVIDER_SITE_OTHER): Payer: Self-pay | Admitting: General Surgery

## 2013-02-07 DIAGNOSIS — K648 Other hemorrhoids: Secondary | ICD-10-CM

## 2013-02-07 MED ORDER — HYDROCORTISONE 1 % EX CREA: TOPICAL_CREAM | CUTANEOUS | Status: DC

## 2013-02-07 MED ORDER — FLUCONAZOLE 200 MG PO TABS: 200.0000 mg | ORAL_TABLET | Freq: Every day | ORAL | Status: DC

## 2013-02-07 NOTE — Telephone Encounter (Signed)
Patient called to state that ever since she had her hems banded she has been having raw irritated skin around her rectal area up to her vaginal area.  Patient states she thinks she has an infection.  Patient denies any fevers at this time.

## 2013-02-07 NOTE — Telephone Encounter (Signed)
Patient aware to pick up Diflucan and Hydrocortisone from the pharmacy which are being called in by Carolynne Edouard MD.  Patient also aware and agreeable for appt on 5/22 to come in to see Carolynne Edouard MD. Patient also aware to stop ointment which patient states doesn't help any ways.

## 2013-02-09 ENCOUNTER — Ambulatory Visit (INDEPENDENT_AMBULATORY_CARE_PROVIDER_SITE_OTHER): Payer: BC Managed Care – PPO | Admitting: General Surgery

## 2013-02-09 ENCOUNTER — Encounter (INDEPENDENT_AMBULATORY_CARE_PROVIDER_SITE_OTHER): Payer: Self-pay | Admitting: General Surgery

## 2013-02-09 VITALS — BP 130/90 | HR 80 | Resp 20 | Ht 66.0 in | Wt 176.6 lb

## 2013-02-09 DIAGNOSIS — K648 Other hemorrhoids: Secondary | ICD-10-CM

## 2013-02-09 NOTE — Progress Notes (Signed)
Subjective:     Patient ID: Gloria [name], female   DOB: 01/19/1951, 62 y.o.   MRN: [id number]  HPI The patient is a 62 year old white female who is one-week status post internal hemorrhoid banding. She developed significant redness and pain and irritation around her rectum. She has noticed only some spotting of [name] on occasion with bowel movements.  Review of Systems     Objective:   Physical Exam On exam she has significant redness and irritation in the perirectal area. This seems to be improving with the hydrocortisone cream. She still has pretty significant internal and external hemorrhoidal disease.    Assessment:     The patient is one-week status post internal hemorrhoid banding and has now developed what appears to be pruritis ani possibly from a reaction to the dibucaine ointment.     Plan:     At this point she will continue to take Diflucan and use the hydrocortisone cream 2-3 times a day. We will plan to see her back in about 2 weeks to check her progress

## 2013-02-09 NOTE — Patient Instructions (Signed)
Continue diflucan and hydrocortisone cream

## 2013-02-24 ENCOUNTER — Encounter (INDEPENDENT_AMBULATORY_CARE_PROVIDER_SITE_OTHER): Payer: Self-pay | Admitting: General Surgery

## 2013-02-24 ENCOUNTER — Ambulatory Visit (INDEPENDENT_AMBULATORY_CARE_PROVIDER_SITE_OTHER): Payer: BC Managed Care – PPO | Admitting: General Surgery

## 2013-02-24 VITALS — BP 140/90 | HR 78 | Temp 98.1°F | Resp 16 | Ht 66.0 in | Wt 178.0 lb

## 2013-02-24 DIAGNOSIS — K648 Other hemorrhoids: Secondary | ICD-10-CM

## 2013-02-24 NOTE — Patient Instructions (Signed)
Continue using hydrocortisone cream for another week or 2

## 2013-02-28 ENCOUNTER — Encounter (INDEPENDENT_AMBULATORY_CARE_PROVIDER_SITE_OTHER): Payer: Self-pay | Admitting: General Surgery

## 2013-02-28 NOTE — Progress Notes (Signed)
Subjective:     Patient ID: Gloria Bell, female   DOB: [birth year], 62 y.o.   MRN: [id number]  HPI The patient is a 62 year old white female who is about one month status post internal hemorrhoid banding. Postoperatively she developed some pruritis ani. She was using some hydrocortisone cream and this has greatly improved. She denies any rectal pain. She denies any bleeding with her bowel movements.  Review of Systems     Objective:   Physical Exam On exam she still has fairly significant internal and external hemorrhoidal disease but no discomfort.    Assessment:     The patient is one month status post internal hemorrhoid banding     Plan:     At this point she can return to her normal activities without restrictions. If she continues to have bleeding with her bowel movements then she may need a formal hemorrhoidectomy.

## 2013-04-19 ENCOUNTER — Encounter (INDEPENDENT_AMBULATORY_CARE_PROVIDER_SITE_OTHER): Payer: BC Managed Care – PPO | Admitting: General Surgery

## 2013-04-24 ENCOUNTER — Encounter (INDEPENDENT_AMBULATORY_CARE_PROVIDER_SITE_OTHER): Payer: Self-pay | Admitting: General Surgery

## 2013-04-24 ENCOUNTER — Ambulatory Visit (INDEPENDENT_AMBULATORY_CARE_PROVIDER_SITE_OTHER): Payer: BC Managed Care – PPO | Admitting: General Surgery

## 2013-04-24 VITALS — BP 130/78 | HR 76 | Resp 16 | Ht 66.0 in | Wt 176.6 lb

## 2013-04-24 DIAGNOSIS — K648 Other hemorrhoids: Secondary | ICD-10-CM

## 2013-04-24 NOTE — Progress Notes (Signed)
Subjective:     Patient ID: Gloria Bell, female   DOB: February 22, 1951, 62 y.o.   MRN: [id number]  HPI The patient is a 62 year old white female who is 3 months status post internal hemorrhoid banding. She has not noticed any bleeding with her bowel movements. She denies any problems with constipation but does take MiraLAX on a regular basis.  Review of Systems     Objective:   Physical Exam On exam she has some small external hemorrhoidal skin tags but are soft. On digital exam she has good rectal tone and no palpable mass.    Assessment:     The patient is 3 months status post internal hemorrhoid banding     Plan:     At this point she can return to all her normal activities without restrictions. We will plan to see her back on a when necessary basis

## 2013-04-25 ENCOUNTER — Encounter (INDEPENDENT_AMBULATORY_CARE_PROVIDER_SITE_OTHER): Payer: BC Managed Care – PPO | Admitting: General Surgery

## 2013-05-09 ENCOUNTER — Telehealth (INDEPENDENT_AMBULATORY_CARE_PROVIDER_SITE_OTHER): Payer: Self-pay

## 2013-05-09 ENCOUNTER — Emergency Department (HOSPITAL_COMMUNITY)
Admission: EM | Admit: 2013-05-09 | Discharge: 2013-05-09 | Disposition: A | Discharge status: Home / Self Care | Payer: BC Managed Care – PPO | Attending: Emergency Medicine | Admitting: Emergency Medicine

## 2013-05-09 ENCOUNTER — Encounter (HOSPITAL_COMMUNITY): Payer: Self-pay | Admitting: Nurse Practitioner

## 2013-05-09 ENCOUNTER — Emergency Department (HOSPITAL_COMMUNITY): Payer: BC Managed Care – PPO

## 2013-05-09 DIAGNOSIS — Z8739 Personal history of other diseases of the musculoskeletal system and connective tissue: Secondary | ICD-10-CM | POA: Insufficient documentation

## 2013-05-09 DIAGNOSIS — Z90710 Acquired absence of both cervix and uterus: Secondary | ICD-10-CM | POA: Insufficient documentation

## 2013-05-09 DIAGNOSIS — Z862 Personal history of diseases of the blood and blood-forming organs and certain disorders involving the immune mechanism: Secondary | ICD-10-CM | POA: Insufficient documentation

## 2013-05-09 DIAGNOSIS — R109 Unspecified abdominal pain: Secondary | ICD-10-CM | POA: Insufficient documentation

## 2013-05-09 DIAGNOSIS — Z8719 Personal history of other diseases of the digestive system: Secondary | ICD-10-CM | POA: Insufficient documentation

## 2013-05-09 DIAGNOSIS — Z8639 Personal history of other endocrine, nutritional and metabolic disease: Secondary | ICD-10-CM | POA: Insufficient documentation

## 2013-05-09 DIAGNOSIS — Z3202 Encounter for pregnancy test, result negative: Secondary | ICD-10-CM | POA: Insufficient documentation

## 2013-05-09 DIAGNOSIS — I10 Essential (primary) hypertension: Secondary | ICD-10-CM | POA: Insufficient documentation

## 2013-05-09 DIAGNOSIS — Z9889 Other specified postprocedural states: Secondary | ICD-10-CM | POA: Insufficient documentation

## 2013-05-09 LAB — CBC WITH DIFFERENTIAL/PLATELET
Basophils Absolute: 0 10*3/uL (ref 0.0–0.1)
Basophils Relative: 0 % (ref 0–1)
HCT: 39.8 % (ref 36.0–46.0)
Hemoglobin: 14 g/dL (ref 12.0–15.0)
Lymphocytes Relative: 42 % (ref 12–46)
MCHC: 35.2 g/dL (ref 30.0–36.0)
Monocytes Relative: 6 % (ref 3–12)
Neutro Abs: 3.1 10*3/uL (ref 1.7–7.7)
Neutrophils Relative %: 51 % (ref 43–77)
WBC: 6.1 10*3/uL (ref 4.0–10.5)

## 2013-05-09 LAB — LIPASE, BLOOD: Lipase: 18 U/L (ref 11–59)

## 2013-05-09 LAB — POCT PREGNANCY, URINE: Preg Test, Ur: NEGATIVE

## 2013-05-09 LAB — COMPREHENSIVE METABOLIC PANEL
AST: 13 U/L (ref 0–37)
Albumin: 4.5 g/dL (ref 3.5–5.2)
Alkaline Phosphatase: 56 U/L (ref 39–117)
BUN: 14 mg/dL (ref 6–23)
CO2: 21 mEq/L (ref 19–32)
Chloride: 101 mEq/L (ref 96–112)
GFR calc non Af Amer: >90 mL/min (ref >90)
Potassium: 3.7 mEq/L (ref 3.5–5.1)
Total Bilirubin: 0.6 mg/dL (ref 0.3–1.2)

## 2013-05-09 LAB — URINALYSIS, ROUTINE W REFLEX MICROSCOPIC
Bilirubin Urine: NEGATIVE
Hgb urine dipstick: NEGATIVE
Nitrite: NEGATIVE
Specific Gravity, Urine: 1.022 (ref 1.005–1.030)
pH: 5.5 (ref 5.0–8.0)

## 2013-05-09 MED ORDER — IOHEXOL 300 MG/ML  SOLN
25.0000 mL | INTRAMUSCULAR | Status: AC
  Administered 2013-05-09: 25 mL via ORAL

## 2013-05-09 MED ORDER — HYDROMORPHONE HCL PF 1 MG/ML IJ SOLN
1.0000 mg | Freq: Once | INTRAMUSCULAR | Status: DC
  Filled 2013-05-09: qty 1

## 2013-05-09 MED ORDER — OXYCODONE-ACETAMINOPHEN 5-325 MG PO TABS: 2.0000 | ORAL_TABLET | ORAL | Status: DC | PRN

## 2013-05-09 MED ORDER — HYDROMORPHONE HCL PF 1 MG/ML IJ SOLN
1.0000 mg | Freq: Once | INTRAMUSCULAR | Status: AC
  Administered 2013-05-09: 1 mg via INTRAVENOUS
  Filled 2013-05-09: qty 1

## 2013-05-09 MED ORDER — ONDANSETRON HCL 4 MG/2ML IJ SOLN
4.0000 mg | Freq: Once | INTRAMUSCULAR | Status: AC
  Administered 2013-05-09: 4 mg via INTRAVENOUS
  Filled 2013-05-09: qty 2

## 2013-05-09 MED ORDER — SODIUM CHLORIDE 0.9 % IV BOLUS (SEPSIS)
1000.0000 mL | Freq: Once | INTRAVENOUS | Status: AC
  Administered 2013-05-09: 1000 mL via INTRAVENOUS

## 2013-05-09 MED ORDER — IOHEXOL 300 MG/ML  SOLN
100.0000 mL | Freq: Once | INTRAMUSCULAR | Status: AC | PRN
  Administered 2013-05-09: 80 mL via INTRAVENOUS

## 2013-05-09 MED ORDER — GI COCKTAIL ~~LOC~~
30.0000 mL | Freq: Once | ORAL | Status: AC
  Administered 2013-05-09: 30 mL via ORAL
  Filled 2013-05-09: qty 30

## 2013-05-09 MED ORDER — PROMETHAZINE HCL 25 MG PO TABS: 25.0000 mg | ORAL_TABLET | Freq: Four times a day (QID) | ORAL | Status: DC | PRN

## 2013-05-09 NOTE — ED Notes (Signed)
Pt c/o severe abd pain onset 5 days ago, history of diverticulitis last year with abscess. Pt called [facility] for appt and they instructed her to come to er and have dr toth paged

## 2013-05-09 NOTE — Telephone Encounter (Signed)
Patient calling in today with severe abdominal pain.  She has a history of diverticulitis, and was treated by Dr. Carolynne Edouard in 2013.  She was instructed to go directly to [facility] ER.  Pt was reassured Dr. Carolynne Edouard would be made aware.

## 2013-05-09 NOTE — ED Provider Notes (Signed)
CSN: [id number]     Arrival date & time 05/09/13  1059 History     First MD Initiated Contact with Patient 05/09/13 1157     Chief Complaint  Patient presents with  . Abdominal Pain   (Consider location/radiation/quality/duration/timing/severity/associated sxs/prior Treatment) HPI Comments: 62 year old female with a history of significant diverticulosis as well as diverticulitis last year that required an inpatient stay. She presents with recurrent abdominal pain which has been present for approximately 5 days, located mostly on the right side of her abdomen and feels like it radiates to her right side of the back. She has ongoing nausea, has not had anything to eat today and was instructed by her surgeon to return to the hospital for evaluation in the emergency department. She denies overt fevers but has had subjective fevers and chills, no dysuria or diarrhea, no swelling or rashes. The symptoms are gradually progressive and are now severe.  Patient is a 62 y.o. female presenting with abdominal pain. The history is provided by the patient and medical records.  Abdominal Pain   Past Medical History  Diagnosis Date  . Hypertension   . Arthritis   . Hyperlipidemia   . Osteoporosis   . Diverticulitis   . Diverticulosis    Past Surgical History  Procedure Laterality Date  . Elbow fracture surgery    . Knee surgery    . Other surgical history      had to be on percocet for a year and it slowed down her "gut"   . Cholecystectomy    . Abdominal hysterectomy    . Appendectomy     Family History  Problem Relation Age of Onset  . Heart disease Father     heart attack  . Diabetes Father   . Cancer Paternal Grandfather     prostate   History  Substance Use Topics  . Smoking status: Never Smoker   . Smokeless tobacco: Never Used  . Alcohol Use: No   OB History   Grav Para Term Preterm Abortions TAB SAB Ect Mult Living                 Review of Systems  Gastrointestinal:  Positive for abdominal pain.  All other systems reviewed and are negative.    Allergies  Ampicillin; Cephalexin; Codeine; Flagyl; Sulfa antibiotics; and Vicodin  Home Medications   Current Outpatient Rx  Name  Route  Sig  Dispense  Refill  . losartan (COZAAR) 50 MG tablet   Oral   Take 50 mg by mouth daily.         . metoprolol succinate (TOPROL-XL) 25 MG 24 hr tablet   Oral   Take 25 mg by mouth daily.         . polyethylene glycol (MIRALAX / GLYCOLAX) packet   Oral   Take 17 g by mouth 2 (two) times daily. Takes at 3 pm and 7 pm         . oxyCODONE-acetaminophen (PERCOCET/ROXICET) 5-325 MG per tablet   Oral   Take 2 tablets by mouth every 4 (four) hours as needed for pain.   15 tablet   0   . promethazine (PHENERGAN) 25 MG tablet   Oral   Take 1 tablet (25 mg total) by mouth every 6 (six) hours as needed for nausea.   12 tablet   0    BP 169/79  Pulse 83  Temp(Src) 98 F (36.7 C) (Oral)  Resp 16  SpO2 100% Physical Exam  Nursing note and vitals reviewed. Constitutional: She appears well-developed and well-nourished.  Uncomfortable appearing  HENT:  Head: Normocephalic and atraumatic.  Mouth/Throat: Oropharynx is clear and moist. No oropharyngeal exudate.  Eyes: Conjunctivae and EOM are normal. Pupils are equal, round, and reactive to light. Right eye exhibits no discharge. Left eye exhibits no discharge. No scleral icterus.  Neck: Normal range of motion. Neck supple. No JVD present. No thyromegaly present.  Cardiovascular: Normal rate, regular rhythm, normal heart sounds and intact distal pulses.  Exam reveals no gallop and no friction rub.   No murmur heard. Pulmonary/Chest: Effort normal and breath sounds normal. No respiratory distress. She has no wheezes. She has no rales.  Abdominal: Soft. Bowel sounds are normal. She exhibits no distension and no mass. There is tenderness.  The abdomen is diffusely soft but tender with guarding on the right lower  and right upper quadrant, no tenderness on the left, no peritoneal signs  Musculoskeletal: Normal range of motion. She exhibits no edema and no tenderness.  Lymphadenopathy:    She has no cervical adenopathy.  Neurological: She is alert. Coordination normal.  Skin: Skin is warm and dry. No rash noted. No erythema.  Psychiatric: She has a normal mood and affect. Her behavior is normal.    ED Course   Procedures (including critical care time)  Labs Reviewed  COMPREHENSIVE METABOLIC PANEL - Abnormal; Notable for the following:    Glucose, Bld 115 (*)    All other components within normal limits  URINALYSIS, ROUTINE W REFLEX MICROSCOPIC - Abnormal; Notable for the following:    Ketones, ur 15 (*)    All other components within normal limits  CBC WITH DIFFERENTIAL  LIPASE, BLOOD  POCT PREGNANCY, URINE   Ct Abdomen Pelvis W Contrast  05/09/2013   CLINICAL DATA:  Lower abdominal pain. History of diverticulosis and diverticulitis.  EXAM: CT ABDOMEN AND PELVIS WITH CONTRAST  TECHNIQUE: Multidetector CT imaging of the abdomen and pelvis was performed using the standard protocol following bolus administration of intravenous contrast.  CONTRAST:  80mL OMNIPAQUE IOHEXOL 300 MG/ML  SOLN  COMPARISON:  09/01/2012  FINDINGS: No confluent opacity in the lung bases. No effusions. Heart is normal size.  Prior cholecystectomy. Liver, spleen, pancreas, adrenals and kidneys are normal. Descending colonic and sigmoid diverticulosis. No active diverticulitis. Small bowel is decompressed. No free fluid, free air or adenopathy. Aorta is normal caliber. Urinary bladder grossly unremarkable. Prior hysterectomy. No adnexal masses.  No acute bony abnormality.  IMPRESSION: Descending colonic and sigmoid diverticulosis. No active diverticulitis. No acute findings.   Electronically Signed   By: Charlett Nose   On: 05/09/2013 15:25   1. Abdominal pain     MDM  The patient is alert he had appendicitis as well as  cholecystitis status post removal, did not require surgery on her diverticular abscess last year but I do have concern that given her diffuse diverticulosis and significant recurrent pain that she probably has diverticulitis which may be right-sided. She does not appear toxic or acutely ill but is definitely uncomfortable and dehydrated requiring IV fluids, symptomatic medications and further evaluation with labs and a CT scan to evaluate the extent of her infection or inflammation. The patient is hemodynamically stable. The general surgeon was made aware of the patient's presence by the triage nurse, will contact when the workup is complete.  CT neg for acute findings, UA has ketones, labs normal otherwise.  Lipase normal.  Pt having ongoing pain - no vomiting, GI  cocktail without improvement - pt will require admission to the hospital for ongoing pain control.  Pt now refuseing admission - HD stable for d/c.  Meds given in ED:  Medications  HYDROmorphone (DILAUDID) injection 1 mg (1 mg Intravenous Not Given 05/09/13 1751)  HYDROmorphone (DILAUDID) injection 1 mg (1 mg Intravenous Given 05/09/13 1251)  sodium chloride 0.9 % bolus 1,000 mL (0 mL Intravenous Stopped 05/09/13 1354)  ondansetron (ZOFRAN) injection 4 mg (4 mg Intravenous Given 05/09/13 1251)  iohexol (OMNIPAQUE) 300 MG/ML solution 25 mL (25 mL Oral Contrast Given 05/09/13 1342)  iohexol (OMNIPAQUE) 300 MG/ML solution 100 mL (80 mL Intravenous Contrast Given 05/09/13 1458)  gi cocktail (Maalox,Lidocaine,Donnatal) (30 mL Oral Given 05/09/13 1603)    New Prescriptions   OXYCODONE-ACETAMINOPHEN (PERCOCET/ROXICET) 5-325 MG PER TABLET    Take 2 tablets by mouth every 4 (four) hours as needed for pain.   PROMETHAZINE (PHENERGAN) 25 MG TABLET    Take 1 tablet (25 mg total) by mouth every 6 (six) hours as needed for nausea.      Vida Roller, MD 05/09/13 [phone]

## 2013-05-10 ENCOUNTER — Encounter (INDEPENDENT_AMBULATORY_CARE_PROVIDER_SITE_OTHER): Payer: BC Managed Care – PPO | Admitting: General Surgery

## 2013-06-20 ENCOUNTER — Encounter (INDEPENDENT_AMBULATORY_CARE_PROVIDER_SITE_OTHER): Payer: Self-pay

## 2013-08-01 ENCOUNTER — Other Ambulatory Visit: Payer: Self-pay | Admitting: Gastroenterology

## 2013-08-01 DIAGNOSIS — R1032 Left lower quadrant pain: Secondary | ICD-10-CM

## 2013-08-03 ENCOUNTER — Ambulatory Visit
Admission: RE | Admit: 2013-08-03 | Discharge: 2013-08-03 | Disposition: A | Discharge status: Home / Self Care | Payer: BC Managed Care – PPO | Source: Ambulatory Visit | Attending: Gastroenterology | Admitting: Gastroenterology

## 2013-08-03 DIAGNOSIS — R1032 Left lower quadrant pain: Secondary | ICD-10-CM

## 2013-08-03 MED ORDER — IOHEXOL 300 MG/ML  SOLN
100.0000 mL | Freq: Once | INTRAMUSCULAR | Status: AC | PRN
  Administered 2013-08-03: 100 mL via INTRAVENOUS

## 2013-08-14 ENCOUNTER — Ambulatory Visit: Payer: Self-pay

## 2013-09-21 HISTORY — PX: COLONOSCOPY: SHX174

## 2013-10-20 ENCOUNTER — Ambulatory Visit: Payer: Self-pay | Admitting: Urology

## 2014-03-12 ENCOUNTER — Other Ambulatory Visit: Payer: Self-pay | Admitting: Gastroenterology

## 2014-03-12 DIAGNOSIS — Z86010 Personal history of colonic polyps: Secondary | ICD-10-CM

## 2014-03-12 DIAGNOSIS — K5730 Diverticulosis of large intestine without perforation or abscess without bleeding: Secondary | ICD-10-CM

## 2014-03-12 DIAGNOSIS — K56609 Unspecified intestinal obstruction, unspecified as to partial versus complete obstruction: Secondary | ICD-10-CM

## 2014-04-04 ENCOUNTER — Ambulatory Visit
Admission: RE | Admit: 2014-04-04 | Discharge: 2014-04-04 | Disposition: A | Discharge status: Home / Self Care | Payer: BC Managed Care – PPO | Source: Ambulatory Visit | Attending: Gastroenterology | Admitting: Gastroenterology

## 2014-04-04 DIAGNOSIS — K5730 Diverticulosis of large intestine without perforation or abscess without bleeding: Secondary | ICD-10-CM

## 2014-04-04 DIAGNOSIS — Z86010 Personal history of colonic polyps: Secondary | ICD-10-CM

## 2014-04-04 DIAGNOSIS — K56609 Unspecified intestinal obstruction, unspecified as to partial versus complete obstruction: Secondary | ICD-10-CM

## 2014-08-31 ENCOUNTER — Ambulatory Visit: Payer: Self-pay | Admitting: Family Medicine

## 2014-10-08 ENCOUNTER — Emergency Department (HOSPITAL_COMMUNITY): Payer: BLUE CROSS/BLUE SHIELD

## 2014-10-08 ENCOUNTER — Emergency Department (HOSPITAL_COMMUNITY)
Admission: EM | Admit: 2014-10-08 | Discharge: 2014-10-08 | Disposition: A | Discharge status: Home / Self Care | Payer: BLUE CROSS/BLUE SHIELD | Attending: Emergency Medicine | Admitting: Emergency Medicine

## 2014-10-08 ENCOUNTER — Encounter (HOSPITAL_COMMUNITY): Payer: Self-pay | Admitting: Emergency Medicine

## 2014-10-08 DIAGNOSIS — Z8639 Personal history of other endocrine, nutritional and metabolic disease: Secondary | ICD-10-CM | POA: Diagnosis not present

## 2014-10-08 DIAGNOSIS — N390 Urinary tract infection, site not specified: Secondary | ICD-10-CM

## 2014-10-08 DIAGNOSIS — K5732 Diverticulitis of large intestine without perforation or abscess without bleeding: Secondary | ICD-10-CM | POA: Insufficient documentation

## 2014-10-08 DIAGNOSIS — R1032 Left lower quadrant pain: Secondary | ICD-10-CM

## 2014-10-08 DIAGNOSIS — M1990 Unspecified osteoarthritis, unspecified site: Secondary | ICD-10-CM | POA: Diagnosis not present

## 2014-10-08 DIAGNOSIS — I10 Essential (primary) hypertension: Secondary | ICD-10-CM | POA: Diagnosis not present

## 2014-10-08 DIAGNOSIS — Z79899 Other long term (current) drug therapy: Secondary | ICD-10-CM | POA: Insufficient documentation

## 2014-10-08 LAB — URINALYSIS, ROUTINE W REFLEX MICROSCOPIC
BILIRUBIN URINE: NEGATIVE
GLUCOSE, UA: NEGATIVE mg/dL
Hgb urine dipstick: NEGATIVE
KETONES UR: 15 mg/dL — AB
Nitrite: NEGATIVE
PH: 5 (ref 5.0–8.0)
Protein, ur: NEGATIVE mg/dL
SPECIFIC GRAVITY, URINE: 1.02 (ref 1.005–1.030)
Urobilinogen, UA: 0.2 mg/dL (ref 0.0–1.0)

## 2014-10-08 LAB — CBC WITH DIFFERENTIAL/PLATELET
BASOS ABS: 0 10*3/uL (ref 0.0–0.1)
Basophils Relative: 0 % (ref 0–1)
EOS ABS: 0 10*3/uL (ref 0.0–0.7)
EOS PCT: 0 % (ref 0–5)
HCT: 40.4 % (ref 36.0–46.0)
HEMOGLOBIN: 14.3 g/dL (ref 12.0–15.0)
Lymphocytes Relative: 19 % (ref 12–46)
Lymphs Abs: 2.3 10*3/uL (ref 0.7–4.0)
MCH: 31.6 pg (ref 26.0–34.0)
MCHC: 35.4 g/dL (ref 30.0–36.0)
MCV: 89.4 fL (ref 78.0–100.0)
MONO ABS: 0.9 10*3/uL (ref 0.1–1.0)
Monocytes Relative: 7 % (ref 3–12)
NEUTROS ABS: 8.9 10*3/uL — AB (ref 1.7–7.7)
Neutrophils Relative %: 74 % (ref 43–77)
Platelets: 257 10*3/uL (ref 150–400)
RBC: 4.52 MIL/uL (ref 3.87–5.11)
RDW: 12.4 % (ref 11.5–15.5)
WBC: 12 10*3/uL — AB (ref 4.0–10.5)

## 2014-10-08 LAB — URINE MICROSCOPIC-ADD ON

## 2014-10-08 LAB — COMPREHENSIVE METABOLIC PANEL
ALK PHOS: 76 U/L (ref 39–117)
ALT: 29 U/L (ref 0–35)
ANION GAP: 14 (ref 5–15)
AST: 21 U/L (ref 0–37)
Albumin: 4.5 g/dL (ref 3.5–5.2)
BILIRUBIN TOTAL: 1.1 mg/dL (ref 0.3–1.2)
BUN: 7 mg/dL (ref 6–23)
CO2: 21 mmol/L (ref 19–32)
Calcium: 9.5 mg/dL (ref 8.4–10.5)
Chloride: 102 mEq/L (ref 96–112)
Creatinine, Ser: 0.79 mg/dL (ref 0.50–1.10)
GFR calc non Af Amer: 87 mL/min — ABNORMAL LOW (ref >90)
Glucose, Bld: 121 mg/dL — ABNORMAL HIGH (ref 70–99)
Potassium: 3.6 mmol/L (ref 3.5–5.1)
SODIUM: 137 mmol/L (ref 135–145)
TOTAL PROTEIN: 7.4 g/dL (ref 6.0–8.3)

## 2014-10-08 MED ORDER — METRONIDAZOLE 500 MG PO TABS: 500.0000 mg | ORAL_TABLET | Freq: Two times a day (BID) | ORAL | Status: DC

## 2014-10-08 MED ORDER — IOHEXOL 300 MG/ML  SOLN
25.0000 mL | Freq: Once | INTRAMUSCULAR | Status: AC | PRN
  Administered 2014-10-08: 25 mL via ORAL

## 2014-10-08 MED ORDER — CIPROFLOXACIN HCL 500 MG PO TABS: 500.0000 mg | ORAL_TABLET | Freq: Two times a day (BID) | ORAL | Status: DC

## 2014-10-08 MED ORDER — OXYCODONE-ACETAMINOPHEN 5-325 MG PO TABS: 2.0000 | ORAL_TABLET | Freq: Four times a day (QID) | ORAL | Status: DC | PRN

## 2014-10-08 MED ORDER — CIPROFLOXACIN IN D5W 400 MG/200ML IV SOLN
400.0000 mg | Freq: Once | INTRAVENOUS | Status: AC
  Administered 2014-10-08: 400 mg via INTRAVENOUS
  Filled 2014-10-08: qty 200

## 2014-10-08 MED ORDER — IOHEXOL 300 MG/ML  SOLN
100.0000 mL | Freq: Once | INTRAMUSCULAR | Status: AC | PRN
  Administered 2014-10-08: 100 mL via INTRAVENOUS

## 2014-10-08 MED ORDER — ONDANSETRON HCL 4 MG/2ML IJ SOLN
4.0000 mg | Freq: Once | INTRAMUSCULAR | Status: AC
  Administered 2014-10-08: 4 mg via INTRAVENOUS
  Filled 2014-10-08: qty 2

## 2014-10-08 MED ORDER — ONDANSETRON HCL 4 MG PO TABS: 4.0000 mg | ORAL_TABLET | Freq: Three times a day (TID) | ORAL | Status: DC | PRN

## 2014-10-08 MED ORDER — HYDROMORPHONE HCL 1 MG/ML IJ SOLN
1.0000 mg | Freq: Once | INTRAMUSCULAR | Status: AC
  Administered 2014-10-08: 1 mg via INTRAVENOUS
  Filled 2014-10-08: qty 1

## 2014-10-08 MED ORDER — METRONIDAZOLE IN NACL 5-0.79 MG/ML-% IV SOLN
500.0000 mg | Freq: Once | INTRAVENOUS | Status: AC
  Administered 2014-10-08: 500 mg via INTRAVENOUS
  Filled 2014-10-08: qty 100

## 2014-10-08 NOTE — Discharge Instructions (Signed)
Diverticulitis Diverticulitis is when small pockets that have formed in your colon (large intestine) become infected or swollen. HOME CARE  Follow your doctor's instructions.  Follow a special diet if told by your doctor.  When you feel better, your doctor may tell you to change your diet. You may be told to eat a lot of fiber. Fruits and vegetables are good sources of fiber. Fiber makes it easier to poop (have bowel movements).  Take supplements or probiotics as told by your doctor.  Only take medicines as told by your doctor.  Keep all follow-up visits with your doctor. GET HELP IF:  Your pain does not get better.  You have a hard time eating food.  You are not pooping like normal. GET HELP RIGHT AWAY IF:  Your pain gets worse.  Your problems do not get better.  Your problems suddenly get worse.  You have a fever.  You keep throwing up (vomiting).  You have bloody or black, tarry poop (stool). MAKE SURE YOU:   Understand these instructions.  Will watch your condition.  Will get help right away if you are not doing well or get worse. Document Released: 02/24/2008 Document Revised: 09/12/2013 Document Reviewed: 08/02/2013 [facility] Patient Information 2015 [place], [place]. This information is not intended to replace advice given to you by your health care provider. Make sure you discuss any questions you have with your health care provider.  Urinary Tract Infection A urinary tract infection (UTI) can occur any place along the urinary tract. The tract includes the kidneys, ureters, bladder, and urethra. A type of germ called bacteria often causes a UTI. UTIs are often helped with antibiotic medicine.  HOME CARE   If given, take antibiotics as told by your doctor. Finish them even if you start to feel better.  Drink enough fluids to keep your pee (urine) clear or pale yellow.  Avoid tea, drinks with caffeine, and bubbly (carbonated) drinks.  Pee often. Avoid  holding your pee in for a long time.  Pee before and after having sex (intercourse).  Wipe from front to back after you poop (bowel movement) if you are a woman. Use each tissue only once. GET HELP RIGHT AWAY IF:   You have back pain.  You have lower belly (abdominal) pain.  You have chills.  You feel sick to your stomach (nauseous).  You throw up (vomit).  Your burning or discomfort with peeing does not go away.  You have a fever.  Your symptoms are not better in 3 days. MAKE SURE YOU:   Understand these instructions.  Will watch your condition.  Will get help right away if you are not doing well or get worse. Document Released: 02/24/2008 Document Revised: 06/01/2012 Document Reviewed: 04/07/2012 [facility] Patient Information 2015 [place], [place]. This information is not intended to replace advice given to you by your health care provider. Make sure you discuss any questions you have with your health care provider.

## 2014-10-08 NOTE — ED Notes (Signed)
Pt started having LLQ pain yesterday that got worse today. Pt had abscess last year with hx of diverticulosis. Denies any n/v.

## 2014-10-08 NOTE — ED Notes (Signed)
CT called and informed that pt finished contrast.

## 2014-10-08 NOTE — ED Provider Notes (Signed)
CSN: [id number]     Arrival date & time 10/08/14  1532 History   First MD Initiated Contact with Patient 10/08/14 1709     Chief Complaint  Patient presents with  . Abdominal Pain     (Consider location/radiation/quality/duration/timing/severity/associated sxs/prior Treatment) HPI 64 year old female with past medical history of prior diverticulitis complicated by small 2 cm abscess back in 2013 which was treated with antibiotic solution and patient recovered well who presents to ED today stating she has similar symptoms to that presentation. She states having gradual onset left lower quadrant pain that began last night and has persisted throughout the day getting to 10/10 in intensity. Patient states her pain is currently 8/10. She denies any nausea, vomiting, diarrhea, urinary symptoms, other symptoms. Palpation worsens pain. Patient had a normal bowel movement earlier today and is passing flatus as she normally does. No treatments tried at home. No other complaints. Past Medical History  Diagnosis Date  . Hypertension   . Arthritis   . Hyperlipidemia   . Osteoporosis   . Diverticulitis   . Diverticulosis    Past Surgical History  Procedure Laterality Date  . Elbow fracture surgery    . Knee surgery    . Other surgical history      had to be on percocet for a year and it slowed down her "gut"   . Cholecystectomy    . Abdominal hysterectomy    . Appendectomy     Family History  Problem Relation Age of Onset  . Heart disease Father     heart attack  . Diabetes Father   . Cancer Paternal Grandfather     prostate   History  Substance Use Topics  . Smoking status: Never Smoker   . Smokeless tobacco: Never Used  . Alcohol Use: No   OB History    No data available     Review of Systems  Constitutional: Negative for fever and chills.  HENT: Negative for congestion, rhinorrhea and sore throat.   Eyes: Negative for visual disturbance.  Respiratory: Negative for cough and  shortness of breath.   Cardiovascular: Negative for chest pain, palpitations and leg swelling.  Gastrointestinal: Positive for abdominal pain. Negative for nausea, vomiting, diarrhea and constipation.  Genitourinary: Negative for dysuria, hematuria, vaginal bleeding and vaginal discharge.  Musculoskeletal: Negative for back pain and neck pain.  Skin: Negative for rash.  Neurological: Negative for weakness and headaches.  All other systems reviewed and are negative.     Allergies  Ampicillin; Cephalexin; Codeine; Flagyl; Sulfa antibiotics; and Vicodin  Home Medications   Prior to Admission medications   Medication Sig Start Date End Date Taking? Authorizing Provider  losartan (COZAAR) 50 MG tablet Take 50 mg by mouth daily.    Historical Provider, MD  metoprolol succinate (TOPROL-XL) 25 MG 24 hr tablet Take 25 mg by mouth daily.    Historical Provider, MD  oxyCODONE-acetaminophen (PERCOCET/ROXICET) 5-325 MG per tablet Take 2 tablets by mouth every 4 (four) hours as needed for pain. 05/09/13   Johnna Acosta, MD  polyethylene glycol [employer] / Floria Raveling) packet Take 17 g by mouth 2 (two) times daily. Takes at 3 pm and 7 pm    Historical Provider, MD  promethazine (PHENERGAN) 25 MG tablet Take 1 tablet (25 mg total) by mouth every 6 (six) hours as needed for nausea. 05/09/13   Johnna Acosta, MD   BP 171/108 mmHg  Pulse 113  Temp(Src) 98.4 F (36.9 C) (Oral)  Resp 22  Ht 5\' 6"  (1.676 m)  Wt 175 lb (79.379 kg)  BMI 28.26 kg/m2  SpO2 96% Physical Exam  Constitutional: She is oriented to person, place, and time. She appears well-developed and well-nourished. No distress.  HENT:  Head: Normocephalic and atraumatic.  Eyes: Conjunctivae are normal.  Neck: Normal range of motion.  Cardiovascular: Normal rate, regular rhythm, normal heart sounds and intact distal pulses.   No murmur heard. Pulmonary/Chest: Effort normal and breath sounds normal. No respiratory distress. She has no  wheezes. She has no rales. She exhibits no tenderness.  Abdominal: Soft. Bowel sounds are normal. She exhibits no distension. There is tenderness in the left lower quadrant. There is guarding. There is no rebound, no tenderness at McBurney's point and negative Murphy's sign.  Musculoskeletal: Normal range of motion.  Neurological: She is alert and oriented to person, place, and time. No cranial nerve deficit.  Skin: Skin is warm and dry.  Psychiatric: She has a normal mood and affect.  Nursing note and vitals reviewed.   ED Course  Procedures (including critical care time) Labs Review Labs Reviewed  CBC WITH DIFFERENTIAL - Abnormal; Notable for the following:    WBC 12.0 (*)    Neutro Abs 8.9 (*)    All other components within normal limits  COMPREHENSIVE METABOLIC PANEL - Abnormal; Notable for the following:    Glucose, Bld 121 (*)    GFR calc non Af Amer 87 (*)    All other components within normal limits  URINALYSIS, ROUTINE W REFLEX MICROSCOPIC    Imaging Review No results found.   EKG Interpretation None      MDM   Final diagnoses:  None    ANAMARIA DUSENBURY Wilfred Curtis is a 64 y.o. female with H&P as above. He is hemodynamically stable on arrival. Abdominal exam notable for left lower quadrant tenderness to palpation with guarding. Screening labs and before 12,000 white blood count. Patient will get a CT scan of her abdomen and pelvis for further evaluation. -Results: Patient is uncompensated sigmoid diverticulitis. Labs notable for UTI. Patient is given first dose of antibiotics in house. Patient will be discharged with Cipro/Flagyl, pain and nausea medication and instructed to follow closely with her PCP. -Re-evaluation: Patient reports pain significantly improved after pain medication. -Disposition: Stable for discharge.   Clinical Impression: 1. Diverticulitis of large intestine without perforation or abscess without bleeding   2. LLQ pain   3. UTI (lower urinary tract  infection)     Disposition: Discharge  Condition: Good  I have discussed the results, Dx and Tx plan with the pt(& family if present). He/she/they expressed understanding and agree(s) with the plan. Discharge instructions discussed at great length. Strict return precautions discussed and pt &/or family have verbalized understanding of the instructions. No further questions at time of discharge.    New Prescriptions   CIPROFLOXACIN (CIPRO) 500 MG TABLET    Take 1 tablet (500 mg total) by mouth 2 (two) times daily.   METRONIDAZOLE (FLAGYL) 500 MG TABLET    Take 1 tablet (500 mg total) by mouth 2 (two) times daily.   ONDANSETRON (ZOFRAN) 4 MG TABLET    Take 1 tablet (4 mg total) by mouth every 8 (eight) hours as needed for nausea or vomiting.   OXYCODONE-ACETAMINOPHEN (PERCOCET/ROXICET) 5-325 MG PER TABLET    Take 2 tablets by mouth every 6 (six) hours as needed for moderate pain or severe pain.    Follow Up: Tawni Millers, MD [address]  [place] [address] [phone]  Schedule an appointment as soon as possible for a visit in 3 days   [facility] [address] [id number] [place] [phone]  If symptoms worsen   Pt seen in conjunction with Dr. Carmin Muskrat, MD  Kirstie Peri, [facility] Resident - PGY-2    Kirstie Peri, MD 10/08/14 6438  Kirstie Peri, MD 10/08/14 3818  Carmin Muskrat, MD 10/08/14 [phone]

## 2014-12-16 ENCOUNTER — Encounter (HOSPITAL_COMMUNITY): Payer: Self-pay | Admitting: Emergency Medicine

## 2014-12-16 ENCOUNTER — Emergency Department (HOSPITAL_COMMUNITY)
Admission: EM | Admit: 2014-12-16 | Discharge: 2014-12-16 | Disposition: A | Discharge status: Home / Self Care | Payer: BLUE CROSS/BLUE SHIELD | Attending: Emergency Medicine | Admitting: Emergency Medicine

## 2014-12-16 ENCOUNTER — Emergency Department (HOSPITAL_COMMUNITY): Payer: BLUE CROSS/BLUE SHIELD

## 2014-12-16 DIAGNOSIS — Z8639 Personal history of other endocrine, nutritional and metabolic disease: Secondary | ICD-10-CM | POA: Insufficient documentation

## 2014-12-16 DIAGNOSIS — R1084 Generalized abdominal pain: Secondary | ICD-10-CM

## 2014-12-16 DIAGNOSIS — T50905A Adverse effect of unspecified drugs, medicaments and biological substances, initial encounter: Secondary | ICD-10-CM

## 2014-12-16 DIAGNOSIS — M1990 Unspecified osteoarthritis, unspecified site: Secondary | ICD-10-CM | POA: Insufficient documentation

## 2014-12-16 DIAGNOSIS — Z8719 Personal history of other diseases of the digestive system: Secondary | ICD-10-CM | POA: Diagnosis not present

## 2014-12-16 DIAGNOSIS — Z79899 Other long term (current) drug therapy: Secondary | ICD-10-CM | POA: Diagnosis not present

## 2014-12-16 DIAGNOSIS — T378X5A Adverse effect of other specified systemic anti-infectives and antiparasitics, initial encounter: Secondary | ICD-10-CM | POA: Diagnosis not present

## 2014-12-16 DIAGNOSIS — Z792 Long term (current) use of antibiotics: Secondary | ICD-10-CM | POA: Insufficient documentation

## 2014-12-16 DIAGNOSIS — R109 Unspecified abdominal pain: Secondary | ICD-10-CM

## 2014-12-16 DIAGNOSIS — Z87440 Personal history of urinary (tract) infections: Secondary | ICD-10-CM | POA: Insufficient documentation

## 2014-12-16 DIAGNOSIS — I10 Essential (primary) hypertension: Secondary | ICD-10-CM | POA: Insufficient documentation

## 2014-12-16 DIAGNOSIS — Z9049 Acquired absence of other specified parts of digestive tract: Secondary | ICD-10-CM | POA: Insufficient documentation

## 2014-12-16 DIAGNOSIS — Z880 Allergy status to penicillin: Secondary | ICD-10-CM | POA: Diagnosis not present

## 2014-12-16 DIAGNOSIS — Z90710 Acquired absence of both cervix and uterus: Secondary | ICD-10-CM | POA: Diagnosis not present

## 2014-12-16 DIAGNOSIS — T368X5A Adverse effect of other systemic antibiotics, initial encounter: Secondary | ICD-10-CM | POA: Diagnosis not present

## 2014-12-16 LAB — COMPREHENSIVE METABOLIC PANEL
ALK PHOS: 41 U/L (ref 39–117)
ALT: 21 U/L (ref 0–35)
AST: 21 U/L (ref 0–37)
Albumin: 4.4 g/dL (ref 3.5–5.2)
Anion gap: 11 (ref 5–15)
BUN: 7 mg/dL (ref 6–23)
CALCIUM: 9.2 mg/dL (ref 8.4–10.5)
CO2: 24 mmol/L (ref 19–32)
Chloride: 104 mmol/L (ref 96–112)
Creatinine, Ser: 0.78 mg/dL (ref 0.50–1.10)
GFR calc Af Amer: >90 mL/min (ref >90)
GFR calc non Af Amer: 87 mL/min — ABNORMAL LOW (ref >90)
GLUCOSE: 110 mg/dL — AB (ref 70–99)
POTASSIUM: 3.8 mmol/L (ref 3.5–5.1)
SODIUM: 139 mmol/L (ref 135–145)
Total Bilirubin: 0.8 mg/dL (ref 0.3–1.2)
Total Protein: 6.7 g/dL (ref 6.0–8.3)

## 2014-12-16 LAB — CBC WITH DIFFERENTIAL/PLATELET
BASOS ABS: 0 10*3/uL (ref 0.0–0.1)
BASOS PCT: 0 % (ref 0–1)
EOS ABS: 0 10*3/uL (ref 0.0–0.7)
EOS PCT: 0 % (ref 0–5)
HEMATOCRIT: 42.2 % (ref 36.0–46.0)
HEMOGLOBIN: 14.2 g/dL (ref 12.0–15.0)
LYMPHS PCT: 24 % (ref 12–46)
Lymphs Abs: 1.6 10*3/uL (ref 0.7–4.0)
MCH: 30.7 pg (ref 26.0–34.0)
MCHC: 33.6 g/dL (ref 30.0–36.0)
MCV: 91.1 fL (ref 78.0–100.0)
MONO ABS: 0.4 10*3/uL (ref 0.1–1.0)
Monocytes Relative: 6 % (ref 3–12)
NEUTROS PCT: 70 % (ref 43–77)
Neutro Abs: 4.7 10*3/uL (ref 1.7–7.7)
Platelets: 221 10*3/uL (ref 150–400)
RBC: 4.63 MIL/uL (ref 3.87–5.11)
RDW: 12.3 % (ref 11.5–15.5)
WBC: 6.8 10*3/uL (ref 4.0–10.5)

## 2014-12-16 LAB — URINALYSIS, ROUTINE W REFLEX MICROSCOPIC
BILIRUBIN URINE: NEGATIVE
Glucose, UA: NEGATIVE mg/dL
HGB URINE DIPSTICK: NEGATIVE
Ketones, ur: NEGATIVE mg/dL
Leukocytes, UA: NEGATIVE
Nitrite: NEGATIVE
Protein, ur: NEGATIVE mg/dL
Specific Gravity, Urine: 1.016 (ref 1.005–1.030)
UROBILINOGEN UA: 0.2 mg/dL (ref 0.0–1.0)
pH: 5 (ref 5.0–8.0)

## 2014-12-16 LAB — I-STAT CG4 LACTIC ACID, ED: LACTIC ACID, VENOUS: 0.91 mmol/L (ref 0.5–2.0)

## 2014-12-16 MED ORDER — MORPHINE SULFATE 4 MG/ML IJ SOLN
4.0000 mg | Freq: Once | INTRAMUSCULAR | Status: AC
  Administered 2014-12-16: 4 mg via INTRAVENOUS
  Filled 2014-12-16: qty 1

## 2014-12-16 MED ORDER — ONDANSETRON HCL 4 MG/2ML IJ SOLN
4.0000 mg | Freq: Once | INTRAMUSCULAR | Status: AC
  Administered 2014-12-16: 4 mg via INTRAVENOUS
  Filled 2014-12-16: qty 2

## 2014-12-16 MED ORDER — SODIUM CHLORIDE 0.9 % IV BOLUS (SEPSIS)
1000.0000 mL | Freq: Once | INTRAVENOUS | Status: AC
  Administered 2014-12-16: 1000 mL via INTRAVENOUS

## 2014-12-16 MED ORDER — IOHEXOL 300 MG/ML  SOLN
100.0000 mL | Freq: Once | INTRAMUSCULAR | Status: AC | PRN
  Administered 2014-12-16: 100 mL via INTRAVENOUS

## 2014-12-16 MED ORDER — IOHEXOL 300 MG/ML  SOLN
25.0000 mL | Freq: Once | INTRAMUSCULAR | Status: AC | PRN
  Administered 2014-12-16: 25 mL via INTRAVENOUS

## 2014-12-16 NOTE — ED Notes (Signed)
Notified CT patient has completed her contrast.

## 2014-12-16 NOTE — ED Notes (Signed)
EDP at bediside.

## 2014-12-16 NOTE — ED Notes (Signed)
PA at bedside.

## 2014-12-16 NOTE — ED Provider Notes (Signed)
CSN: [id number]     Arrival date & time 12/16/14  1115 History   First MD Initiated Contact with Patient 12/16/14 1120     Chief Complaint  Patient presents with  . Medication Reaction     (Consider location/radiation/quality/duration/timing/severity/associated sxs/prior Treatment) HPI   Gloria Bell Is a 64 year old female with past medical history of diverticulitis , hypertension, who presents with complaint of "medication reaction." Patient is a poor historian. History is limited by capacity of the patient, patient's ability to communicate effectively, and overall poor insight.  The patient states that on Tuesday of this past week she began having left-sided pain similar to her previous history of diverticulitis. She has worked in by Dr. Oletta Lamas, who prescribed her Cipro and Flagyl. She was also found to have a urinary tract infection at that time. Since that time she has had worsening suprapubic bladder spasms and pain, now has pain radiating to bilateral flanks. She states that she thinks she is having a medication reaction because she feels "like I'm being stabbed in the back with a knife, and that I am sitting and pool of ice and burnin up on the inside." Patient states that she knows this is a medication reaction. She denies any known fevers. She has diffuse abdominal pain.she has chronic diarrhea, denies constipation, melena or hematochezia. He denies nausea or vomiting.  Past Medical History  Diagnosis Date  . Hypertension   . Arthritis   . Hyperlipidemia   . Osteoporosis   . Diverticulitis   . Diverticulosis    Past Surgical History  Procedure Laterality Date  . Elbow fracture surgery    . Knee surgery    . Other surgical history      had to be on percocet for a year and it slowed down her "gut"   . Cholecystectomy    . Abdominal hysterectomy    . Appendectomy     Family History  Problem Relation Age of Onset  . Heart disease Father     heart attack  . Diabetes  Father   . Cancer Paternal Grandfather     prostate   History  Substance Use Topics  . Smoking status: Never Smoker   . Smokeless tobacco: Never Used  . Alcohol Use: No   OB History    No data available     Review of Systems  Ten systems reviewed and are negative for acute change, except as noted in the HPI.    Allergies  Ampicillin; Cephalexin; Codeine; Flagyl; Oxycodone-acetaminophen; Sulfa antibiotics; and Vicodin  Home Medications   Prior to Admission medications   Medication Sig Start Date End Date Taking? Authorizing Provider  acetaminophen (TYLENOL) 500 MG tablet Take 500 mg by mouth as needed (knee pain). 2-3 times weekly as needed    Historical Provider, MD  ciprofloxacin (CIPRO) 500 MG tablet Take 1 tablet (500 mg total) by mouth 2 (two) times daily. 10/08/14   Kirstie Peri, MD  losartan (COZAAR) 50 MG tablet Take 50 mg by mouth daily.    Historical Provider, MD  metoprolol succinate (TOPROL-XL) 25 MG 24 hr tablet Take 25 mg by mouth daily.    Historical Provider, MD  metroNIDAZOLE (FLAGYL) 500 MG tablet Take 1 tablet (500 mg total) by mouth 2 (two) times daily. 10/08/14   Kirstie Peri, MD  ondansetron (ZOFRAN) 4 MG tablet Take 1 tablet (4 mg total) by mouth every 8 (eight) hours as needed for nausea or vomiting. 10/08/14   Kirstie Peri, MD  oxyCODONE-acetaminophen (PERCOCET/ROXICET) 5-325 MG per tablet Take 2 tablets by mouth every 4 (four) hours as needed for pain. 05/09/13   Noemi Chapel, MD  oxyCODONE-acetaminophen (PERCOCET/ROXICET) 5-325 MG per tablet Take 2 tablets by mouth every 6 (six) hours as needed for moderate pain or severe pain. 10/08/14   Kirstie Peri, MD  polyethylene glycol [facility] / Floria Raveling) packet Take 17 g by mouth 2 (two) times daily. Takes at 3 pm and 7 pm    Historical Provider, MD  promethazine (PHENERGAN) 25 MG tablet Take 1 tablet (25 mg total) by mouth every 6 (six) hours as needed for nausea. 05/09/13   Noemi Chapel, MD   BP 150/80 mmHg   Pulse 84  Temp(Src) 98 F (36.7 C) (Oral)  Resp 15  SpO2 99% Physical Exam  Constitutional: She is oriented to person, place, and time. She appears well-developed and well-nourished. No distress.  HENT:  Head: Normocephalic and atraumatic.  Eyes: Conjunctivae are normal. No scleral icterus.  Neck: Normal range of motion.  Cardiovascular: Normal rate, regular rhythm and normal heart sounds.  Exam reveals no gallop and no friction rub.   No murmur heard. Pulmonary/Chest: Effort normal and breath sounds normal. No respiratory distress.  Abdominal: Soft. Bowel sounds are normal. She exhibits no distension and no mass. There is tenderness (Diffuse). There is no guarding.  Bilateral CVA tenderness  Neurological: She is alert and oriented to person, place, and time.  Skin: Skin is warm and dry. She is not diaphoretic.  Nursing note and vitals reviewed.   ED Course  Procedures (including critical care time) Labs Review Labs Reviewed - No data to display  Imaging Review No results found.   EKG Interpretation None      MDM   Final diagnoses:  Abdominal pain  Abdominal pain      Patient with flank pain. Abdominal pain. Recent UTI. She is a very poor historian and her compaints are difficult to discern. CT abdomen is negative negative urine and labs are without significant abnormality. I am leaving care to Dr. Harlow Mares. Patient stable throughout visit.  Margarita Mail, PA-C 12/16/14 1942  Ezequiel Essex, MD 12/17/14 1130

## 2014-12-16 NOTE — Discharge Instructions (Signed)
Abdominal Pain Stop taking cipro and flagyl. Follow up with your doctor. There is no evidence of diverticulitis or urinary tract infection. Return to the Ed if you develop new or worsening symptoms. Many things can cause abdominal pain. Usually, abdominal pain is not caused by a disease and will improve without treatment. It can often be observed and treated at home. Your health care provider will do a physical exam and possibly order blood tests and X-rays to help determine the seriousness of your pain. However, in many cases, more time must pass before a clear cause of the pain can be found. Before that point, your health care provider may not know if you need more testing or further treatment. HOME CARE INSTRUCTIONS  Monitor your abdominal pain for any changes. The following actions may help to alleviate any discomfort you are experiencing:  Only take over-the-counter or prescription medicines as directed by your health care provider.  Do not take laxatives unless directed to do so by your health care provider.  Try a clear liquid diet (broth, tea, or water) as directed by your health care provider. Slowly move to a bland diet as tolerated. SEEK MEDICAL CARE IF:  You have unexplained abdominal pain.  You have abdominal pain associated with nausea or diarrhea.  You have pain when you urinate or have a bowel movement.  You experience abdominal pain that wakes you in the night.  You have abdominal pain that is worsened or improved by eating food.  You have abdominal pain that is worsened with eating fatty foods.  You have a fever. SEEK IMMEDIATE MEDICAL CARE IF:   Your pain does not go away within 2 hours.  You keep throwing up (vomiting).  Your pain is felt only in portions of the abdomen, such as the right side or the left lower portion of the abdomen.  You pass bloody or black tarry stools. MAKE SURE YOU:  Understand these instructions.   Will watch your condition.    Will get help right away if you are not doing well or get worse.  Document Released: 06/17/2005 Document Revised: 09/12/2013 Document Reviewed: 05/17/2013 [facility] Patient Information 2015 [place], [place]. This information is not intended to replace advice given to you by your health care provider. Make sure you discuss any questions you have with your health care provider.

## 2014-12-16 NOTE — ED Notes (Signed)
Spoke with CT patient is next to come over.

## 2014-12-16 NOTE — ED Notes (Signed)
Patient states she has a HX of diverticulosis and PCP placed her on flagyl and Cipro  on Tuesday  due to UTI and stated she now feels warm on the Rt side and this morning started having tingling on left side.

## 2014-12-17 LAB — URINE CULTURE
COLONY COUNT: NO GROWTH
Culture: NO GROWTH

## 2015-02-09 ENCOUNTER — Emergency Department: Payer: BLUE CROSS/BLUE SHIELD

## 2015-02-09 ENCOUNTER — Emergency Department
Admission: EM | Admit: 2015-02-09 | Discharge: 2015-02-09 | Disposition: A | Discharge status: Home / Self Care | Payer: BLUE CROSS/BLUE SHIELD | Attending: Emergency Medicine | Admitting: Emergency Medicine

## 2015-02-09 ENCOUNTER — Encounter: Payer: Self-pay | Admitting: Emergency Medicine

## 2015-02-09 DIAGNOSIS — Z79899 Other long term (current) drug therapy: Secondary | ICD-10-CM | POA: Insufficient documentation

## 2015-02-09 DIAGNOSIS — N3090 Cystitis, unspecified without hematuria: Secondary | ICD-10-CM | POA: Insufficient documentation

## 2015-02-09 DIAGNOSIS — I10 Essential (primary) hypertension: Secondary | ICD-10-CM | POA: Diagnosis not present

## 2015-02-09 DIAGNOSIS — R1030 Lower abdominal pain, unspecified: Secondary | ICD-10-CM

## 2015-02-09 DIAGNOSIS — R300 Dysuria: Secondary | ICD-10-CM | POA: Diagnosis present

## 2015-02-09 LAB — URINALYSIS COMPLETE WITH MICROSCOPIC (ARMC ONLY)
BILIRUBIN URINE: NEGATIVE
GLUCOSE, UA: NEGATIVE mg/dL
Hgb urine dipstick: NEGATIVE
Ketones, ur: NEGATIVE mg/dL
LEUKOCYTES UA: NEGATIVE
Nitrite: POSITIVE — AB
Protein, ur: NEGATIVE mg/dL
Specific Gravity, Urine: 1.003 — ABNORMAL LOW (ref 1.005–1.030)
pH: 6 (ref 5.0–8.0)

## 2015-02-09 LAB — COMPREHENSIVE METABOLIC PANEL
ALBUMIN: 4.6 g/dL (ref 3.5–5.0)
ALT: 15 U/L (ref 14–54)
AST: 16 U/L (ref 15–41)
Alkaline Phosphatase: 52 U/L (ref 38–126)
Anion gap: 9 (ref 5–15)
BUN: 11 mg/dL (ref 6–20)
CO2: 29 mmol/L (ref 22–32)
Calcium: 9.3 mg/dL (ref 8.9–10.3)
Chloride: 102 mmol/L (ref 101–111)
Creatinine, Ser: 0.75 mg/dL (ref 0.44–1.00)
GFR calc Af Amer: >60 mL/min (ref >60)
GFR calc non Af Amer: >60 mL/min (ref >60)
Glucose, Bld: 109 mg/dL — ABNORMAL HIGH (ref 65–99)
Potassium: 3.9 mmol/L (ref 3.5–5.1)
SODIUM: 140 mmol/L (ref 135–145)
TOTAL PROTEIN: 7.4 g/dL (ref 6.5–8.1)
Total Bilirubin: 0.9 mg/dL (ref 0.3–1.2)

## 2015-02-09 LAB — CBC WITH DIFFERENTIAL/PLATELET
Basophils Absolute: 0 10*3/uL (ref 0–0.1)
Basophils Relative: 1 %
Eosinophils Absolute: 0 10*3/uL (ref 0–0.7)
Eosinophils Relative: 0 %
HEMATOCRIT: 39.5 % (ref 35.0–47.0)
HEMOGLOBIN: 13.6 g/dL (ref 12.0–16.0)
Lymphocytes Relative: 38 %
Lymphs Abs: 2.8 10*3/uL (ref 1.0–3.6)
MCH: 31.7 pg (ref 26.0–34.0)
MCHC: 34.6 g/dL (ref 32.0–36.0)
MCV: 91.6 fL (ref 80.0–100.0)
Monocytes Absolute: 0.5 10*3/uL (ref 0.2–0.9)
Monocytes Relative: 6 %
Neutro Abs: 4.1 10*3/uL (ref 1.4–6.5)
Neutrophils Relative %: 55 %
Platelets: 227 10*3/uL (ref 150–440)
RBC: 4.31 MIL/uL (ref 3.80–5.20)
RDW: 12.5 % (ref 11.5–14.5)
WBC: 7.4 10*3/uL (ref 3.6–11.0)

## 2015-02-09 LAB — LIPASE, BLOOD: LIPASE: 36 U/L (ref 22–51)

## 2015-02-09 MED ORDER — IOHEXOL 300 MG/ML  SOLN
100.0000 mL | Freq: Once | INTRAMUSCULAR | Status: AC | PRN
  Administered 2015-02-09: 100 mL via INTRAVENOUS

## 2015-02-09 MED ORDER — MORPHINE SULFATE 4 MG/ML IJ SOLN
INTRAMUSCULAR | Status: AC
  Filled 2015-02-09: qty 1

## 2015-02-09 MED ORDER — ONDANSETRON HCL 4 MG/2ML IJ SOLN
4.0000 mg | Freq: Once | INTRAMUSCULAR | Status: AC
  Administered 2015-02-09: 4 mg via INTRAVENOUS

## 2015-02-09 MED ORDER — PHENAZOPYRIDINE HCL 200 MG PO TABS
ORAL_TABLET | ORAL | Status: AC
  Filled 2015-02-09: qty 1

## 2015-02-09 MED ORDER — ONDANSETRON HCL 4 MG/2ML IJ SOLN
INTRAMUSCULAR | Status: AC
  Filled 2015-02-09: qty 2

## 2015-02-09 MED ORDER — IOHEXOL 240 MG/ML SOLN: 25.0000 mL | Freq: Once | INTRAMUSCULAR | Status: DC | PRN

## 2015-02-09 MED ORDER — PHENAZOPYRIDINE HCL 200 MG PO TABS: 200.0000 mg | ORAL_TABLET | Freq: Three times a day (TID) | ORAL | Status: DC | PRN

## 2015-02-09 MED ORDER — NITROFURANTOIN MONOHYD MACRO 100 MG PO CAPS: 100.0000 mg | ORAL_CAPSULE | Freq: Two times a day (BID) | ORAL | Status: AC

## 2015-02-09 MED ORDER — MORPHINE SULFATE 4 MG/ML IJ SOLN
4.0000 mg | Freq: Once | INTRAMUSCULAR | Status: AC
  Administered 2015-02-09: 4 mg via INTRAVENOUS

## 2015-02-09 MED ORDER — NITROFURANTOIN MONOHYD MACRO 100 MG PO CAPS
100.0000 mg | ORAL_CAPSULE | Freq: Once | ORAL | Status: AC
  Administered 2015-02-09: 100 mg via ORAL

## 2015-02-09 MED ORDER — PHENAZOPYRIDINE HCL 200 MG PO TABS
200.0000 mg | ORAL_TABLET | Freq: Once | ORAL | Status: AC
  Administered 2015-02-09: 200 mg via ORAL

## 2015-02-09 MED ORDER — ONDANSETRON 4 MG PO TBDP
4.0000 mg | ORAL_TABLET | Freq: Once | ORAL | Status: AC
  Administered 2015-02-09: 4 mg via ORAL

## 2015-02-09 MED ORDER — NITROFURANTOIN MACROCRYSTAL 100 MG PO CAPS
ORAL_CAPSULE | ORAL | Status: AC
  Filled 2015-02-09: qty 1

## 2015-02-09 MED ORDER — ONDANSETRON 4 MG PO TBDP
ORAL_TABLET | ORAL | Status: DC
Start: 2015-02-09 — End: 2015-02-10
  Filled 2015-02-09: qty 1

## 2015-02-09 MED ORDER — ONDANSETRON HCL 4 MG PO TABS: 4.0000 mg | ORAL_TABLET | Freq: Three times a day (TID) | ORAL | Status: DC | PRN

## 2015-02-09 NOTE — Discharge Instructions (Signed)
Abdominal Pain, Women °Abdominal (stomach, pelvic, or belly) pain can be caused by many things. It is important to tell your doctor: °· The location of the pain. °· Does it come and go or is it present all the time? °· Are there things that start the pain (eating certain foods, exercise)? °· Are there other symptoms associated with the pain (fever, nausea, vomiting, diarrhea)? °All of this is helpful to know when trying to find the cause of the pain. °CAUSES  °· Stomach: virus or bacteria infection, or ulcer. °· Intestine: appendicitis (inflamed appendix), regional ileitis (Crohn's disease), ulcerative colitis (inflamed colon), irritable bowel syndrome, diverticulitis (inflamed diverticulum of the colon), or cancer of the stomach or intestine. °· Gallbladder disease or stones in the gallbladder. °· Kidney disease, kidney stones, or infection. °· Pancreas infection or cancer. °· Fibromyalgia (pain disorder). °· Diseases of the female organs: °¨ Uterus: fibroid (non-cancerous) tumors or infection. °¨ Fallopian tubes: infection or tubal pregnancy. °¨ Ovary: cysts or tumors. °¨ Pelvic adhesions (scar tissue). °¨ Endometriosis (uterus lining tissue growing in the pelvis and on the pelvic organs). °¨ Pelvic congestion syndrome (female organs filling up with blood just before the menstrual period). °¨ Pain with the menstrual period. °¨ Pain with ovulation (producing an egg). °¨ Pain with an IUD (intrauterine device, birth control) in the uterus. °¨ Cancer of the female organs. °· Functional pain (pain not caused by a disease, may improve without treatment). °· Psychological pain. °· Depression. °DIAGNOSIS  °Your doctor will decide the seriousness of your pain by doing an examination. °· Blood tests. °· X-rays. °· Ultrasound. °· CT scan (computed tomography, special type of X-ray). °· MRI (magnetic resonance imaging). °· Cultures, for infection. °· Barium enema (dye inserted in the large intestine, to better view it with  X-rays). °· Colonoscopy (looking in intestine with a lighted tube). °· Laparoscopy (minor surgery, looking in abdomen with a lighted tube). °· Major abdominal exploratory surgery (looking in abdomen with a large incision). °TREATMENT  °The treatment will depend on the cause of the pain.  °· Many cases can be observed and treated at home. °· Over-the-counter medicines recommended by your caregiver. °· Prescription medicine. °· Antibiotics, for infection. °· Birth control pills, for painful periods or for ovulation pain. °· Hormone treatment, for endometriosis. °· Nerve blocking injections. °· Physical therapy. °· Antidepressants. °· Counseling with a psychologist or psychiatrist. °· Minor or major surgery. °HOME CARE INSTRUCTIONS  °· Do not take laxatives, unless directed by your caregiver. °· Take over-the-counter pain medicine only if ordered by your caregiver. Do not take aspirin because it can cause an upset stomach or bleeding. °· Try a clear liquid diet (broth or water) as ordered by your caregiver. Slowly move to a bland diet, as tolerated, if the pain is related to the stomach or intestine. °· Have a thermometer and take your temperature several times a day, and record it. °· Bed rest and sleep, if it helps the pain. °· Avoid sexual intercourse, if it causes pain. °· Avoid stressful situations. °· Keep your follow-up appointments and tests, as your caregiver orders. °· If the pain does not go away with medicine or surgery, you may try: °¨ Acupuncture. °¨ Relaxation exercises (yoga, meditation). °¨ Group therapy. °¨ Counseling. °SEEK MEDICAL CARE IF:  °· You notice certain foods cause stomach pain. °· Your home care treatment is not helping your pain. °· You need stronger pain medicine. °· You want your IUD removed. °· You feel faint or   lightheaded. °· You develop nausea and vomiting. °· You develop a rash. °· You are having side effects or an allergy to your medicine. °SEEK IMMEDIATE MEDICAL CARE IF:  °· Your  pain does not go away or gets worse. °· You have a fever. °· Your pain is felt only in portions of the abdomen. The right side could possibly be appendicitis. The left lower portion of the abdomen could be colitis or diverticulitis. °· You are passing blood in your stools (bright red or black tarry stools, with or without vomiting). °· You have blood in your urine. °· You develop chills, with or without a fever. °· You pass out. °MAKE SURE YOU:  °· Understand these instructions. °· Will watch your condition. °· Will get help right away if you are not doing well or get worse. °Document Released: 07/05/2007 Document Revised: 01/22/2014 Document Reviewed: 07/25/2009 °[employer]® Patient Information ©2015 [employer]. This information is not intended to replace advice given to you by your health care provider. Make sure you discuss any questions you have with your health care provider. ° °

## 2015-02-09 NOTE — ED Provider Notes (Signed)
[facility] Emergency Department Provider Note  ____________________________________________  Time seen: Approximately 5:30 PM  I have reviewed the triage vital signs and the nursing notes.   HISTORY  Chief Complaint Dysuria    HPI Gloria Bell Wilfred Curtis is a 64 y.o. female with a history of diverticulitis and urinary tract infection and presents with 4 days of dysuria as well as left lower quadrant pain. Patient says that she has had some shaking chills but no overt fever. Associated nausea. Says that in the past she has had a diverticular abscess which she needed to have drained. Also having urinary frequency.No vaginal discharge or bleeding.   Past Medical History  Diagnosis Date  . Hypertension   . Arthritis   . Hyperlipidemia   . Osteoporosis   . Diverticulitis   . Diverticulosis     Patient Active Problem List   Diagnosis Date Noted  . Bleeding internal hemorrhoids 01/25/2013  . Abdominal pain, acute, left lower quadrant 04/18/2012  . Intra-abdominal abscess 01/06/2012  . Diverticulitis 01/05/2012  . HTN (hypertension) 01/05/2012    Past Surgical History  Procedure Laterality Date  . Elbow fracture surgery    . Knee surgery    . Other surgical history      had to be on percocet for a year and it slowed down her "gut"   . Cholecystectomy    . Abdominal hysterectomy    . Appendectomy      Current Outpatient Rx  Name  Route  Sig  Dispense  Refill  . acetaminophen (TYLENOL) 500 MG tablet   Oral   Take 500 mg by mouth as needed (knee pain). 2-3 times weekly as needed         . ciprofloxacin (CIPRO) 500 MG tablet   Oral   Take 1 tablet (500 mg total) by mouth 2 (two) times daily.   20 tablet   0   . losartan (COZAAR) 50 MG tablet   Oral   Take 50 mg by mouth daily.         . metoprolol succinate (TOPROL-XL) 25 MG 24 hr tablet   Oral   Take 25 mg by mouth daily.         . metroNIDAZOLE (FLAGYL) 500 MG tablet   Oral   Take 1  tablet (500 mg total) by mouth 2 (two) times daily.   20 tablet   0   . Multiple Vitamin (MULTIVITAMIN) capsule   Oral   Take 1 capsule by mouth daily at 12 noon.         . ondansetron (ZOFRAN) 4 MG tablet   Oral   Take 1 tablet (4 mg total) by mouth every 8 (eight) hours as needed for nausea or vomiting.   20 tablet   0   . oxyCODONE-acetaminophen (PERCOCET/ROXICET) 5-325 MG per tablet   Oral   Take 2 tablets by mouth every 4 (four) hours as needed for pain. Patient not taking: Reported on 12/16/2014   15 tablet   0   . oxyCODONE-acetaminophen (PERCOCET/ROXICET) 5-325 MG per tablet   Oral   Take 2 tablets by mouth every 6 (six) hours as needed for moderate pain or severe pain. Patient not taking: Reported on 12/16/2014   15 tablet   0   . polyethylene glycol (MIRALAX / GLYCOLAX) packet   Oral   Take 17 g by mouth 2 (two) times daily. Takes at 3 pm and 7 pm         .  promethazine (PHENERGAN) 25 MG tablet   Oral   Take 1 tablet (25 mg total) by mouth every 6 (six) hours as needed for nausea. Patient not taking: Reported on 12/16/2014   12 tablet   0   . simvastatin (ZOCOR) 20 MG tablet   Oral   Take 20 mg by mouth at bedtime.         Marland Kitchen zolpidem (AMBIEN) 10 MG tablet   Oral   Take 10 mg by mouth at bedtime as needed for sleep.            Allergies Ampicillin; Cephalexin; Codeine; Flagyl; Oxycodone-acetaminophen; Sulfa antibiotics; and Vicodin  Family History  Problem Relation Age of Onset  . Heart disease Father     heart attack  . Diabetes Father   . Cancer Paternal Grandfather     prostate    Social History History  Substance Use Topics  . Smoking status: Never Smoker   . Smokeless tobacco: Never Used  . Alcohol Use: No    Review of Systems Constitutional: No fever/chills Eyes: No visual changes. ENT: No sore throat. Cardiovascular: Denies chest pain. Respiratory: Denies shortness of breath. Gastrointestinal: Left lower quadrant  abdominal pain.  Genitourinary: Dysuria with frequency.  Musculoskeletal: Negative for back pain. Skin: Negative for rash. Neurological: Negative for headaches, focal weakness or numbness.  10-point ROS otherwise negative.  ____________________________________________   PHYSICAL EXAM:  VITAL SIGNS: ED Triage Vitals  Enc Vitals Group     BP 02/09/15 1429 138/102 mmHg     Pulse Rate 02/09/15 1429 89     Resp 02/09/15 1429 18     Temp 02/09/15 1429 98.6 F (37 C)     Temp Source 02/09/15 1429 Oral     SpO2 02/09/15 1429 97 %     Weight 02/09/15 1429 172 lb (78.019 kg)     Height 02/09/15 1429 5\' 6"  (1.676 m)     Head Cir --      Peak Flow --      Pain Score 02/09/15 1429 7     Pain Loc --      Pain Edu? --      Excl. in [place]? --     Constitutional: Alert and oriented. Well appearing and in no acute distress. Eyes: Conjunctivae are normal. PERRL. EOMI. Head: Atraumatic. Nose: No congestion/rhinnorhea. Mouth/Throat: Mucous membranes are moist.  Oropharynx non-erythematous. Neck: No stridor.   Cardiovascular: Normal rate, regular rhythm. Grossly normal heart sounds.  Good peripheral circulation. Respiratory: Normal respiratory effort.  No retractions. Lungs CTAB. Gastrointestinal: Soft.  Left lower quadrant tenderness to palpation as well as suprapubic tenderness to palpation. There is no tenderness over McBurney's point. Negative Murphy sign. No distention. No abdominal bruits. No CVA tenderness. Musculoskeletal: No lower extremity tenderness nor edema.  No joint effusions. Neurologic:  Normal speech and language. No gross focal neurologic deficits are appreciated. Speech is normal. No gait instability. Skin:  Skin is warm, dry and intact. No rash noted. Psychiatric: Mood and affect are normal. Speech and behavior are normal.  ____________________________________________   LABS (all labs ordered are listed, but only abnormal results are displayed)  Labs Reviewed   URINALYSIS COMPLETEWITH MICROSCOPIC ([place])  - Abnormal; Notable for the following:    Color, Urine AMBER (*)    APPearance CLEAR (*)    Specific Gravity, Urine 1.003 (*)    Nitrite POSITIVE (*)    Bacteria, UA RARE (*)    Squamous Epithelial / LPF 0-5 (*)  All other components within normal limits  COMPREHENSIVE METABOLIC PANEL - Abnormal; Notable for the following:    Glucose, Bld 109 (*)    All other components within normal limits  URINE CULTURE  CBC WITH DIFFERENTIAL/PLATELET  LIPASE, BLOOD   ____________________________________________  EKG   ____________________________________________  RADIOLOGY  Possible cystitis on CT scan. No apparent diverticulitis. ____________________________________________   PROCEDURES   ____________________________________________   INITIAL IMPRESSION / ASSESSMENT AND PLAN / ED COURSE  Pertinent labs & imaging results that were available during my care of the patient were reviewed by me and considered in my medical decision making (see chart for details).  Patient with history of diverticular abscess. We'll scan the abdomen. Possible UTI with positive nitrite however unconvincing given no leuk esterase or white blood cells.  ----------------------------------------- 8:42 PM on 02/09/2015 -----------------------------------------  We'll treat with Macrobid for cystitis. Patient says has had issues in the past vomiting with antibiotics. We'll give Zofran as well. We'll discharge to home. Will follow up with her primary care doctor. ____________________________________________   FINAL CLINICAL IMPRESSION(S) / ED DIAGNOSES  Abdominal pain. Cystitis. Acute, initial visit.    Orbie Pyo, MD 02/09/15 [phone]

## 2015-02-09 NOTE — ED Notes (Signed)
Patient with c/o lower abdominal pain along with urinary frequency, burning and pain.

## 2015-02-09 NOTE — ED Notes (Signed)
LLQ abd pain that radiates to groin. That started 4 days ago. Pt also reports urinary frequency. Pain with urination. Denies vomiting or diarrhea but endorses nausea.

## 2015-02-11 LAB — URINE CULTURE: Culture: NO GROWTH

## 2015-05-18 ENCOUNTER — Inpatient Hospital Stay
Admission: EM | Admit: 2015-05-18 | Discharge: 2015-05-20 | DRG: 379 | Disposition: A | Discharge status: Home / Self Care | Payer: BLUE CROSS/BLUE SHIELD | Attending: Internal Medicine | Admitting: Internal Medicine

## 2015-05-18 ENCOUNTER — Encounter: Payer: Self-pay | Admitting: Emergency Medicine

## 2015-05-18 DIAGNOSIS — K922 Gastrointestinal hemorrhage, unspecified: Secondary | ICD-10-CM | POA: Diagnosis present

## 2015-05-18 DIAGNOSIS — Z882 Allergy status to sulfonamides status: Secondary | ICD-10-CM | POA: Diagnosis not present

## 2015-05-18 DIAGNOSIS — E785 Hyperlipidemia, unspecified: Secondary | ICD-10-CM | POA: Diagnosis present

## 2015-05-18 DIAGNOSIS — M810 Age-related osteoporosis without current pathological fracture: Secondary | ICD-10-CM | POA: Diagnosis present

## 2015-05-18 DIAGNOSIS — Z8042 Family history of malignant neoplasm of prostate: Secondary | ICD-10-CM

## 2015-05-18 DIAGNOSIS — K5792 Diverticulitis of intestine, part unspecified, without perforation or abscess without bleeding: Secondary | ICD-10-CM

## 2015-05-18 DIAGNOSIS — Z9889 Other specified postprocedural states: Secondary | ICD-10-CM | POA: Diagnosis not present

## 2015-05-18 DIAGNOSIS — Z881 Allergy status to other antibiotic agents status: Secondary | ICD-10-CM

## 2015-05-18 DIAGNOSIS — Z90710 Acquired absence of both cervix and uterus: Secondary | ICD-10-CM

## 2015-05-18 DIAGNOSIS — M1990 Unspecified osteoarthritis, unspecified site: Secondary | ICD-10-CM | POA: Diagnosis present

## 2015-05-18 DIAGNOSIS — K5791 Diverticulosis of intestine, part unspecified, without perforation or abscess with bleeding: Secondary | ICD-10-CM | POA: Diagnosis present

## 2015-05-18 DIAGNOSIS — Z9049 Acquired absence of other specified parts of digestive tract: Secondary | ICD-10-CM | POA: Diagnosis present

## 2015-05-18 DIAGNOSIS — Z888 Allergy status to other drugs, medicaments and biological substances status: Secondary | ICD-10-CM | POA: Diagnosis not present

## 2015-05-18 DIAGNOSIS — I10 Essential (primary) hypertension: Secondary | ICD-10-CM | POA: Diagnosis present

## 2015-05-18 DIAGNOSIS — K5901 Slow transit constipation: Secondary | ICD-10-CM | POA: Diagnosis present

## 2015-05-18 DIAGNOSIS — Z8249 Family history of ischemic heart disease and other diseases of the circulatory system: Secondary | ICD-10-CM

## 2015-05-18 DIAGNOSIS — Z833 Family history of diabetes mellitus: Secondary | ICD-10-CM

## 2015-05-18 LAB — CBC
HEMATOCRIT: 39.7 % (ref 35.0–47.0)
HEMOGLOBIN: 13.6 g/dL (ref 12.0–16.0)
MCH: 31.6 pg (ref 26.0–34.0)
MCHC: 34.3 g/dL (ref 32.0–36.0)
MCV: 92.1 fL (ref 80.0–100.0)
Platelets: 219 10*3/uL (ref 150–440)
RBC: 4.31 MIL/uL (ref 3.80–5.20)
RDW: 12.6 % (ref 11.5–14.5)
WBC: 7.3 10*3/uL (ref 3.6–11.0)

## 2015-05-18 LAB — URINALYSIS COMPLETE WITH MICROSCOPIC (ARMC ONLY)
BACTERIA UA: NONE SEEN
Bilirubin Urine: NEGATIVE
Glucose, UA: NEGATIVE mg/dL
HGB URINE DIPSTICK: NEGATIVE
Ketones, ur: NEGATIVE mg/dL
Nitrite: NEGATIVE
PH: 7 (ref 5.0–8.0)
PROTEIN: NEGATIVE mg/dL
SPECIFIC GRAVITY, URINE: 1.019 (ref 1.005–1.030)

## 2015-05-18 LAB — COMPREHENSIVE METABOLIC PANEL
ALT: 12 U/L — ABNORMAL LOW (ref 14–54)
ANION GAP: 11 (ref 5–15)
AST: 22 U/L (ref 15–41)
Albumin: 4.5 g/dL (ref 3.5–5.0)
Alkaline Phosphatase: 43 U/L (ref 38–126)
BUN: 20 mg/dL (ref 6–20)
CO2: 24 mmol/L (ref 22–32)
Calcium: 9.2 mg/dL (ref 8.9–10.3)
Chloride: 105 mmol/L (ref 101–111)
Creatinine, Ser: 0.76 mg/dL (ref 0.44–1.00)
GFR calc Af Amer: >60 mL/min (ref >60)
Glucose, Bld: 173 mg/dL — ABNORMAL HIGH (ref 65–99)
POTASSIUM: 3.7 mmol/L (ref 3.5–5.1)
Sodium: 140 mmol/L (ref 135–145)
Total Bilirubin: 0.2 mg/dL — ABNORMAL LOW (ref 0.3–1.2)
Total Protein: 7 g/dL (ref 6.5–8.1)

## 2015-05-18 LAB — TYPE AND SCREEN
ABO/RH(D): A POS
ANTIBODY SCREEN: NEGATIVE

## 2015-05-18 LAB — ABO/RH: ABO/RH(D): A POS

## 2015-05-18 MED ORDER — SIMVASTATIN 20 MG PO TABS: 20.0000 mg | ORAL_TABLET | Freq: Every day | ORAL | Status: DC

## 2015-05-18 MED ORDER — ERTAPENEM SODIUM 1 G IJ SOLR: 1.0000 g | INTRAMUSCULAR | Status: DC

## 2015-05-18 MED ORDER — SODIUM CHLORIDE 0.9 % IV SOLN
1.0000 g | INTRAVENOUS | Status: DC
  Administered 2015-05-19 (×2): 1 g via INTRAVENOUS
  Filled 2015-05-18 (×4): qty 1

## 2015-05-18 MED ORDER — ACETAMINOPHEN 500 MG PO TABS: 500.0000 mg | ORAL_TABLET | ORAL | Status: DC | PRN

## 2015-05-18 MED ORDER — MORPHINE SULFATE (PF) 2 MG/ML IV SOLN
2.0000 mg | INTRAVENOUS | Status: DC | PRN
  Administered 2015-05-19 (×2): 2 mg via INTRAVENOUS
  Filled 2015-05-18 (×2): qty 1

## 2015-05-18 MED ORDER — POLYETHYLENE GLYCOL 3350 17 G PO PACK
17.0000 g | PACK | Freq: Two times a day (BID) | ORAL | Status: DC
  Administered 2015-05-19 (×2): 17 g via ORAL
  Filled 2015-05-18 (×3): qty 1

## 2015-05-18 MED ORDER — SODIUM CHLORIDE 0.9 % IV SOLN
INTRAVENOUS | Status: DC
  Administered 2015-05-19 – 2015-05-20 (×4): via INTRAVENOUS

## 2015-05-18 MED ORDER — ONDANSETRON HCL 4 MG PO TABS
4.0000 mg | ORAL_TABLET | Freq: Three times a day (TID) | ORAL | Status: DC | PRN
  Administered 2015-05-19 (×2): 4 mg via ORAL
  Filled 2015-05-18 (×2): qty 1

## 2015-05-18 MED ORDER — METOPROLOL SUCCINATE ER 25 MG PO TB24
25.0000 mg | ORAL_TABLET | Freq: Every day | ORAL | Status: DC
  Administered 2015-05-19 – 2015-05-20 (×2): 25 mg via ORAL
  Filled 2015-05-18 (×2): qty 1

## 2015-05-18 MED ORDER — LOSARTAN POTASSIUM 50 MG PO TABS
50.0000 mg | ORAL_TABLET | Freq: Every day | ORAL | Status: DC
  Administered 2015-05-19 – 2015-05-20 (×2): 50 mg via ORAL
  Filled 2015-05-18 (×2): qty 1

## 2015-05-18 MED ORDER — CIPROFLOXACIN IN D5W 400 MG/200ML IV SOLN
400.0000 mg | Freq: Two times a day (BID) | INTRAVENOUS | Status: DC
  Filled 2015-05-18: qty 200

## 2015-05-18 MED ORDER — ZOLPIDEM TARTRATE 5 MG PO TABS: 10.0000 mg | ORAL_TABLET | Freq: Every evening | ORAL | Status: DC | PRN

## 2015-05-18 MED ORDER — METRONIDAZOLE IN NACL 5-0.79 MG/ML-% IV SOLN
500.0000 mg | Freq: Three times a day (TID) | INTRAVENOUS | Status: DC
  Filled 2015-05-18: qty 100

## 2015-05-18 NOTE — ED Notes (Signed)
Pt requesting to be able to get up and walk. States it helps with her bowels. Pt taken off the monitor

## 2015-05-18 NOTE — ED Notes (Signed)
Pt states unable to give urine sample as she urinated after triage brought her back.

## 2015-05-18 NOTE — ED Notes (Signed)
Patient presents to the ED stating, "I think something inside me burst."  Patient states she sat down to go the bathroom about 5:30 and she said the toilet was "full of blood".  Patient states she has had severe diverticulitis x 10 years.  Patient states blood was bright red.  Patient states she is unsure of whether is is still bleeding, states she placed a pad in her underwear.  Patient states she has been putting off a colostomy surgery that her doctor wants her to have because her colon is very small in places.  Patient reports feeling nauseous.  Patient is very talkative and is not grimacing or gaurding in pain.

## 2015-05-18 NOTE — ED Provider Notes (Signed)
[facility] Emergency Department Provider Note  ____________________________________________  Time seen: Approximately 7:26 PM  I have reviewed the triage vital signs and the nursing notes.   HISTORY  Chief Complaint Abdominal Pain and Rectal Bleeding    HPI DUHA ABAIR Gloria Bell is a 64 y.o. female with an extensive history of diverticulosis.  She presents with acute onset of large volume of bright red blood per rectum.  She states that she got up to go to the bathroom this evening and "filled the toilet with blood".  She said that she had to wipe her legs because the blood was dripping down her legs.  She has had very mild intermittent lower abdominal cramping which is currently absent.  She denies fever/chills, chest pain, shortness of breath, nausea/vomiting.  She describes her symptoms as severe but under control at this time.   Past Medical History  Diagnosis Date  . Hypertension   . Arthritis   . Hyperlipidemia   . Osteoporosis   . Diverticulitis   . Diverticulosis     Patient Active Problem List   Diagnosis Date Noted  . GI bleed 05/18/2015  . Bleeding internal hemorrhoids 01/25/2013  . Abdominal pain, acute, left lower quadrant 04/18/2012  . Intra-abdominal abscess 01/06/2012  . Diverticulitis 01/05/2012  . HTN (hypertension) 01/05/2012    Past Surgical History  Procedure Laterality Date  . Elbow fracture surgery    . Knee surgery    . Other surgical history      had to be on percocet for a year and it slowed down her "gut"   . Cholecystectomy    . Abdominal hysterectomy    . Appendectomy      Current Outpatient Rx  Name  Route  Sig  Dispense  Refill  . losartan (COZAAR) 50 MG tablet   Oral   Take 50 mg by mouth daily.         . metoprolol succinate (TOPROL-XL) 25 MG 24 hr tablet   Oral   Take 25 mg by mouth daily.         . Multiple Vitamin (MULTIVITAMIN WITH MINERALS) TABS tablet   Oral   Take 1 tablet by mouth daily.          . polyethylene glycol (MIRALAX / GLYCOLAX) packet   Oral   Take 17 g by mouth 2 (two) times daily. Takes at 3 pm and 7 pm         . simvastatin (ZOCOR) 20 MG tablet   Oral   Take 20 mg by mouth daily.         Marland Kitchen zolpidem (AMBIEN) 10 MG tablet   Oral   Take 10 mg by mouth at bedtime as needed for sleep.           Allergies Ampicillin; Cephalexin; Ciprofloxacin; Codeine; Flagyl; Oxycodone-acetaminophen; Sulfa antibiotics; and Vicodin  Family History  Problem Relation Age of Onset  . Heart disease Father     heart attack  . Diabetes Father   . Cancer Paternal Grandfather     prostate    Social History Social History  Substance Use Topics  . Smoking status: Never Smoker   . Smokeless tobacco: Never Used  . Alcohol Use: No    Review of Systems Constitutional: No fever/chills Eyes: No visual changes. ENT: No sore throat. Cardiovascular: Denies chest pain. Respiratory: Denies shortness of breath. Gastrointestinal: Mild abdominal cramping.  Bright red blood per rectum.  No nausea, no vomiting.  No diarrhea.  No constipation. Genitourinary: Negative for dysuria. Musculoskeletal: Negative for back pain. Skin: Negative for rash. Neurological: Negative for headaches, focal weakness or numbness.  10-point ROS otherwise negative.  ____________________________________________   PHYSICAL EXAM:  VITAL SIGNS: ED Triage Vitals  Enc Vitals Group     BP 05/18/15 1827 159/84 mmHg     Pulse Rate 05/18/15 1827 87     Resp 05/18/15 1827 16     Temp 05/18/15 1827 97.9 F (36.6 C)     Temp Source 05/18/15 1827 Oral     SpO2 05/18/15 1827 96 %     Weight 05/18/15 1827 175 lb (79.379 kg)     Height 05/18/15 1827 5\' 6"  (1.676 m)     Head Cir --      Peak Flow --      Pain Score 05/18/15 1828 7     Pain Loc --      Pain Edu? --      Excl. in [place]? --     Constitutional: Alert and oriented. Well appearing and in no acute distress. Eyes: Conjunctivae are normal.  PERRL. EOMI. Head: Atraumatic. Nose: No congestion/rhinnorhea. Mouth/Throat: Mucous membranes are moist.  Oropharynx non-erythematous. Neck: No stridor.   Cardiovascular: Normal rate, regular rhythm. Grossly normal heart sounds.  Good peripheral circulation. Respiratory: Normal respiratory effort.  No retractions. Lungs CTAB. Gastrointestinal: Soft and nontender. No distention. No abdominal bruits. No CVA tenderness.  On rectal exam there was no gross blood but there was also no stool in the rectal vault, and the test was strongly positive on Hemoccult with the quality control passed. Musculoskeletal: No lower extremity tenderness nor edema.  No joint effusions. Neurologic:  Normal speech and language. No gross focal neurologic deficits are appreciated.  Skin:  Skin is warm, dry and intact. No rash noted. Psychiatric: Mood and affect are normal. Speech and behavior are normal.  ____________________________________________   LABS (all labs ordered are listed, but only abnormal results are displayed)  Labs Reviewed  COMPREHENSIVE METABOLIC PANEL - Abnormal; Notable for the following:    Glucose, Bld 173 (*)    ALT 12 (*)    Total Bilirubin 0.2 (*)    All other components within normal limits  URINALYSIS COMPLETEWITH MICROSCOPIC ([facility] ONLY) - Abnormal; Notable for the following:    Color, Urine YELLOW (*)    APPearance CLEAR (*)    Leukocytes, UA TRACE (*)    Squamous Epithelial / LPF 0-5 (*)    All other components within normal limits  CBC  TYPE AND SCREEN  ABO/RH   ____________________________________________  EKG  Not indicated ____________________________________________  RADIOLOGY   No results found.  ____________________________________________   PROCEDURES  Procedure(s) performed: None  Critical Care performed: No ____________________________________________   INITIAL IMPRESSION / ASSESSMENT AND PLAN / ED COURSE  Pertinent labs & imaging results that  were available during my care of the patient were reviewed by me and considered in my medical decision making (see chart for details).  The patient has no signs or symptoms to make me concern for diverticulitis.  She does however need admission for further evaluation of large volume bright red lower GI bleeding most likely consistent with diverticular disease.  I discussed the case with the patient and with the hospitalist and we will proceed with admission.  The patient is hemodynamically stable, does not have any tachycardia, and has 2 peripheral IVs should she need fluid or blood resuscitation.  ____________________________________________  FINAL CLINICAL IMPRESSION(S) / ED DIAGNOSES  Final diagnoses:  Acute lower GI bleeding      NEW MEDICATIONS STARTED DURING THIS VISIT:  New Prescriptions   No medications on file     Hinda Kehr, MD 05/18/15 2234

## 2015-05-18 NOTE — ED Notes (Signed)
Pt stated she was allergic to both cipro and flagyl - admitting dr to d/c orders and reorder different antibiotic

## 2015-05-18 NOTE — H&P (Signed)
Gloria Bell Wilfred Curtis is an 64 y.o. female.   Chief Complaint: Bright red blood per rectum HPI: Started have left lower quadrant abdominal pain. Describes as non radiating and burning sensation. This was followed by a bowel movement that was grossly bloody. Has extesive history of bowel issues including diverticulosis.  Past Medical History  Diagnosis Date  . Hypertension   . Arthritis   . Hyperlipidemia   . Osteoporosis   . Diverticulitis   . Diverticulosis     Past Surgical History  Procedure Laterality Date  . Elbow fracture surgery    . Knee surgery    . Other surgical history      had to be on percocet for a year and it slowed down her "gut"   . Cholecystectomy    . Abdominal hysterectomy    . Appendectomy      Family History  Problem Relation Age of Onset  . Heart disease Father     heart attack  . Diabetes Father   . Cancer Paternal Grandfather     prostate   Social History:  reports that she has never smoked. She has never used smokeless tobacco. She reports that she does not drink alcohol or use illicit drugs.  Allergies:  Allergies  Allergen Reactions  . Ampicillin Nausea And Vomiting  . Cephalexin Nausea And Vomiting  . Codeine Nausea And Vomiting  . Flagyl [Metronidazole] Nausea And Vomiting  . Oxycodone-Acetaminophen Nausea And Vomiting  . Sulfa Antibiotics Nausea And Vomiting  . Vicodin [Hydrocodone-Acetaminophen] Nausea And Vomiting     (Not in a hospital admission)  Results for orders placed or performed during the hospital encounter of 05/18/15 (from the past 48 hour(s))  Comprehensive metabolic panel     Status: Abnormal   Collection Time: 05/18/15  6:29 PM  Result Value Ref Range   Sodium 140 135 - 145 mmol/L   Potassium 3.7 3.5 - 5.1 mmol/L   Chloride 105 101 - 111 mmol/L   CO2 24 22 - 32 mmol/L   Glucose, Bld 173 (H) 65 - 99 mg/dL   BUN 20 6 - 20 mg/dL   Creatinine, Ser 0.76 0.44 - 1.00 mg/dL   Calcium 9.2 8.9 - 10.3 mg/dL   Total Protein  7.0 6.5 - 8.1 g/dL   Albumin 4.5 3.5 - 5.0 g/dL   AST 22 15 - 41 U/L   ALT 12 (L) 14 - 54 U/L   Alkaline Phosphatase 43 38 - 126 U/L   Total Bilirubin 0.2 (L) 0.3 - 1.2 mg/dL   GFR calc non Af Amer >60 >60 mL/min   GFR calc Af Amer >60 >60 mL/min    Comment: (NOTE) The eGFR has been calculated using the CKD EPI equation. This calculation has not been validated in all clinical situations. eGFR's persistently <60 mL/min signify possible Chronic Kidney Disease.    Anion gap 11 5 - 15  CBC     Status: None   Collection Time: 05/18/15  6:29 PM  Result Value Ref Range   WBC 7.3 3.6 - 11.0 K/uL   RBC 4.31 3.80 - 5.20 MIL/uL   Hemoglobin 13.6 12.0 - 16.0 g/dL   HCT 39.7 35.0 - 47.0 %   MCV 92.1 80.0 - 100.0 fL   MCH 31.6 26.0 - 34.0 pg   MCHC 34.3 32.0 - 36.0 g/dL   RDW 12.6 11.5 - 14.5 %   Platelets 219 150 - 440 K/uL  Urinalysis complete, with microscopic ([facility] only)  Status: Abnormal   Collection Time: 05/18/15  7:26 PM  Result Value Ref Range   Color, Urine YELLOW (A) YELLOW   APPearance CLEAR (A) CLEAR   Glucose, UA NEGATIVE NEGATIVE mg/dL   Bilirubin Urine NEGATIVE NEGATIVE   Ketones, ur NEGATIVE NEGATIVE mg/dL   Specific Gravity, Urine 1.019 1.005 - 1.030   Hgb urine dipstick NEGATIVE NEGATIVE   pH 7.0 5.0 - 8.0   Protein, ur NEGATIVE NEGATIVE mg/dL   Nitrite NEGATIVE NEGATIVE   Leukocytes, UA TRACE (A) NEGATIVE   RBC / HPF 0-5 0 - 5 RBC/hpf   WBC, UA 0-5 0 - 5 WBC/hpf   Bacteria, UA NONE SEEN NONE SEEN   Squamous Epithelial / LPF 0-5 (A) NONE SEEN   Mucous PRESENT   Type and screen     Status: None   Collection Time: 05/18/15  8:10 PM  Result Value Ref Range   ABO/RH(D) A POS    Antibody Screen NEG    Sample Expiration 05/21/2015    No results found.  Review of Systems  Constitutional: Negative for fever and chills.  HENT: Negative for hearing loss.   Eyes: Negative for blurred vision.  Respiratory: Negative for shortness of breath.   Cardiovascular:  Negative for chest pain and leg swelling.  Gastrointestinal: Positive for abdominal pain and blood in stool. Negative for nausea and vomiting.  Genitourinary: Negative for dysuria.  Musculoskeletal: Negative for joint pain.  Skin: Negative for rash.  Neurological: Negative for dizziness, sensory change and weakness.  Endo/Heme/Allergies: Does not bruise/bleed easily.  Psychiatric/Behavioral: Negative for depression.    Blood pressure 125/112, pulse 71, temperature 97.9 F (36.6 C), temperature source Oral, resp. rate 20, height 5' 6" (1.676 m), weight 79.379 kg (175 lb), SpO2 97 %. Physical Exam  Constitutional: She is oriented to person, place, and time. She appears well-developed and well-nourished. No distress.  HENT:  Head: Normocephalic and atraumatic.  Mouth/Throat: Oropharynx is clear and moist. No oropharyngeal exudate.  Eyes: Pupils are equal, round, and reactive to light. No scleral icterus.  Neck: Neck supple. No JVD present. No tracheal deviation present. No thyromegaly present.  Cardiovascular: Normal rate.   No murmur heard. Respiratory: No respiratory distress.  Clear to ascultation. No dullness to percusion. No use of accessary muscles   GI: Soft. Bowel sounds are normal. She exhibits no mass.  Tender in LLQ. No rebound or guarding.  Musculoskeletal: She exhibits no edema or tenderness.  Lymphadenopathy:    She has no cervical adenopathy.  Neurological: She is alert and oriented to person, place, and time. No cranial nerve deficit.  Skin: Skin is warm and dry.     Assessment/Plan 1. GI Bleed: Suspect diverticular bleed. Will start abx. Cosult GI. Consider CT scan to rule out abcess.  2. Diverticulitis: Dx by history and physical exam. Has hx of repeated episiodes and states she has been recommended surgery before. Abx.  3. Chronic Constipation: Continue home meds.  4. HTN: Controlled with current meds.  Time= 45 min  Baxter Hire 05/18/2015, 9:24  PM

## 2015-05-19 LAB — CBC
HCT: 40.1 % (ref 35.0–47.0)
Hemoglobin: 13.6 g/dL (ref 12.0–16.0)
MCH: 31.2 pg (ref 26.0–34.0)
MCHC: 34 g/dL (ref 32.0–36.0)
MCV: 91.8 fL (ref 80.0–100.0)
PLATELETS: 192 10*3/uL (ref 150–440)
RBC: 4.37 MIL/uL (ref 3.80–5.20)
RDW: 12.6 % (ref 11.5–14.5)
WBC: 6.9 10*3/uL (ref 3.6–11.0)

## 2015-05-19 MED ORDER — PROMETHAZINE HCL 25 MG/ML IJ SOLN
12.5000 mg | Freq: Once | INTRAMUSCULAR | Status: AC
Start: 2015-05-19 — End: 2015-05-19
  Administered 2015-05-19: 12.5 mg via INTRAVENOUS
  Filled 2015-05-19: qty 1

## 2015-05-19 MED ORDER — IOHEXOL 240 MG/ML SOLN: 25.0000 mL | INTRAMUSCULAR | Status: AC

## 2015-05-19 NOTE — Progress Notes (Signed)
[facility] [place] NAME: Gloria Bell    MR#:  [id number]  DATE OF BIRTH:  10/12/1950  SUBJECTIVE:  CHIEF COMPLAINT:   Chief Complaint  Patient presents with  . Abdominal Pain  . Rectal Bleeding   Has not had a BM since admission and thinks that she is constipated. Has not seen any further bleeding. Has mild left lower quadrant cramping  REVIEW OF SYSTEMS:   Review of Systems  Constitutional: Negative for fever.  Respiratory: Negative for shortness of breath.   Cardiovascular: Negative for chest pain and palpitations.  Gastrointestinal: Positive for abdominal pain, constipation and blood in stool. Negative for nausea and vomiting.  Genitourinary: Negative for dysuria.    DRUG ALLERGIES:   Allergies  Allergen Reactions  . Ampicillin Nausea And Vomiting  . Cephalexin Nausea And Vomiting  . Ciprofloxacin Dermatitis    Pt states felt like "burning under her skin all over"  . Codeine Nausea And Vomiting  . Flagyl [Metronidazole] Dermatitis    States previously had n/v to this med then had a rxt with "burning skin all over"  . Oxycodone-Acetaminophen Nausea And Vomiting  . Sulfa Antibiotics Nausea And Vomiting  . Vicodin [Hydrocodone-Acetaminophen] Nausea And Vomiting    VITALS:  Blood pressure 159/82, pulse 70, temperature 98.3 F (36.8 C), temperature source Oral, resp. rate 16, height 5\' 6"  (1.676 m), weight 79.379 kg (175 lb), SpO2 98 %.  PHYSICAL EXAMINATION:  GENERAL:  64 y.o.-year-old patient lying in the bed with no acute distress.   LUNGS: Normal breath sounds bilaterally, no wheezing, rales,rhonchi or crepitation. No use of accessory muscles of respiration.  CARDIOVASCULAR: S1, S2 normal. No murmurs, rubs, or gallops.  ABDOMEN: Soft, mildly tender to palpation in the left lower quadrant, no guarding or rebound, nondistended. Bowel sounds present. No organomegaly or mass.  EXTREMITIES: No pedal edema,  cyanosis, or clubbing.  NEUROLOGIC: Cranial nerves II through XII are intact. Muscle strength 5/5 in all extremities. Sensation intact. Gait not checked.  PSYCHIATRIC: The patient is alert and oriented x 3. Seems very anxious and talkative SKIN: No obvious rash, lesion, or ulcer.    LABORATORY PANEL:   CBC  Recent Labs Lab 05/19/15 0637  WBC 6.9  HGB 13.6  HCT 40.1  PLT 192   ------------------------------------------------------------------------------------------------------------------  Chemistries   Recent Labs Lab 05/18/15 1829  NA 140  K 3.7  CL 105  CO2 24  GLUCOSE 173*  BUN 20  CREATININE 0.76  CALCIUM 9.2  AST 22  ALT 12*  ALKPHOS 43  BILITOT 0.2*   ------------------------------------------------------------------------------------------------------------------  Cardiac Enzymes No results for input(s): TROPONINI in the last 168 hours. ------------------------------------------------------------------------------------------------------------------  RADIOLOGY:  No results found.  EKG:   Orders placed or performed during the hospital encounter of 12/16/14  . ED EKG  . ED EKG  . EKG 12-Lead  . EKG 12-Lead  . EKG    ASSESSMENT AND PLAN:   This 64 year old woman with history of significant diverticulosis with recurrent bouts of diverticulitis and chronic constipation due to slow transit presents with 1 episode of large volume bright red blood per rectum on 8/27  #1 GI bleed: Likely diverticular.  - GI consultation is ordered, pending - We will obtain abdominal CT scan if she continues to complain of left lower quadrant pain and tenderness - Continue Invanz as she is allergic to Cipro and Flagyl  #2 diverticulitis - As above - Discussed that she will likely need  at least a partial colectomy in the future due to recurrence and scarring  #3 chronic constipation - Continue MiraLAX - States that she is unable to have routine colonoscopy, needs  virtual colonoscopy due to scarring of the intestine from chronic diverticulosis/diverticulitis as well as multiple abdominal surgeries and slow transit constipation  #4 hypertension: Controlled continue home medications  All the records are reviewed and case discussed with Care Management/Social Workerr. Management plans discussed with the patient, no family was present.Marland Kitchen  CODE STATUS: Full  TOTAL TIME TAKING CARE OF THIS PATIENT: 25 minutes.  Greater than 50% of time spent in care coordination and counseling. POSSIBLE D/C IN 1-2 DAYS, DEPENDING ON CLINICAL CONDITION.   Myrtis Ser M.D on 05/19/2015 at 1:40 PM  Between 7am to 6pm - Pager - [phone]  After 6pm go to [email or web address] - password [email or web address] [facility]  [facility]  Office  [phone]  CC: Primary care physician; Leonides Sake, MD

## 2015-05-19 NOTE — Consult Note (Signed)
GI Inpatient Consult Note  Reason for Consult:  LGIB   Attending Requesting Consult: Dr Volanda Napoleon  History of Present Illness:  Gloria Bell is a 64 y.o. female with PMHx diverticulitis presenting for LGIB.  Gloria Bell reports one episode of bleeding yesterday, bright red, described as large amount. No prior GI bleeding. No bleeding or stool since yesterday afternoon. Bleeding was accompanied by some LLQ pain. She has hx of may episodes of LLQ pain,  And has had over 6 CT a/p in past few years.  Only one of the CT showed diverticulutis, the others showed just diverticulosis.   She is followed by Dr Naaman Plummer, and could not complete last colon due to severe diverticulosis.  Had virtual colonoscopy 03/2014 which was remarkable for only diverticulosis.     Started on empiric abx this admission for diverticuliltis.   Past Medical History:  Past Medical History  Diagnosis Date  . Hypertension   . Arthritis   . Hyperlipidemia   . Osteoporosis   . Diverticulitis   . Diverticulosis     Problem List: Patient Active Problem List   Diagnosis Date Noted  . GI bleed 05/18/2015  . Bleeding internal hemorrhoids 01/25/2013  . Abdominal pain, acute, left lower quadrant 04/18/2012  . Intra-abdominal abscess 01/06/2012  . Diverticulitis 01/05/2012  . HTN (hypertension) 01/05/2012    Past Surgical History: Past Surgical History  Procedure Laterality Date  . Elbow fracture surgery    . Knee surgery    . Other surgical history      had to be on percocet for a year and it slowed down her "gut"   . Cholecystectomy    . Abdominal hysterectomy    . Appendectomy      Allergies: Allergies  Allergen Reactions  . Ampicillin Nausea And Vomiting  . Cephalexin Nausea And Vomiting  . Ciprofloxacin Dermatitis    Pt states felt like "burning under her skin all over"  . Codeine Nausea And Vomiting  . Flagyl [Metronidazole] Dermatitis    States previously had n/v to this med then had a rxt  with "burning skin all over"  . Oxycodone-Acetaminophen Nausea And Vomiting  . Sulfa Antibiotics Nausea And Vomiting  . Vicodin [Hydrocodone-Acetaminophen] Nausea And Vomiting    Home Medications: Prescriptions prior to admission  Medication Sig Dispense Refill Last Dose  . losartan (COZAAR) 50 MG tablet Take 50 mg by mouth daily.   05/18/2015 at 1700  . metoprolol succinate (TOPROL-XL) 25 MG 24 hr tablet Take 25 mg by mouth daily.   05/18/2015 at 1700  . Multiple Vitamin (MULTIVITAMIN WITH MINERALS) TABS tablet Take 1 tablet by mouth daily.   Past Month at Unknown time  . polyethylene glycol (MIRALAX / GLYCOLAX) packet Take 17 g by mouth 2 (two) times daily. Takes at 3 pm and 7 pm   05/18/2015 at Unknown time  . simvastatin (ZOCOR) 20 MG tablet Take 20 mg by mouth daily.   05/17/2015 at Unknown time  . zolpidem (AMBIEN) 10 MG tablet Take 10 mg by mouth at bedtime as needed for sleep.   Past Week at Unknown time   Home medication reconciliation was completed with the patient.   Scheduled Inpatient Medications:   . ertapenem  1 g Intravenous Q24H  . losartan  50 mg Oral Daily  . metoprolol succinate  25 mg Oral Daily  . polyethylene glycol  17 g Oral BID  . simvastatin  20 mg Oral QHS    Continuous  Inpatient Infusions:   . sodium chloride 100 mL/hr at 05/19/15 2058    PRN Inpatient Medications:  acetaminophen, morphine injection, ondansetron, zolpidem  Family History: family history includes Cancer in her paternal grandfather; Diabetes in her father; Heart disease in her father.    Social History:   reports that she has never smoked. She has never used smokeless tobacco. She reports that she does not drink alcohol or use illicit drugs.   Review of Systems: Constitutional: Weight is stable.  Eyes: No changes in vision. ENT: No oral lesions, sore throat.  GI: see HPI.  Heme/Lymph: +easy bruising CV: No chest pain.  GU: No hematuria.  Integumentary: No rashes.  Neuro: No  headaches.  Psych: No depression/anxiety.  Endocrine: No heat/cold intolerance.  Allergic/Immunologic: No urticaria.  Resp: No cough, SOB.  Musculoskeletal: No joint swelling.    Physical Examination: BP 165/81 mmHg  Pulse 72  Temp(Src) 97.9 F (36.6 C) (Oral)  Resp 16  Ht 5\' 6"  (1.676 m)  Wt 79.379 kg (175 lb)  BMI 28.26 kg/m2  SpO2 96% Gen: NAD, alert and oriented x 4 HEENT: PEERLA, EOMI, Neck: supple, no JVD or thyromegaly Chest: CTA bilaterally, no wheezes, crackles, or other adventitious sounds CV: RRR, no m/g/c/r Abd: soft, NT, ND, +BS in all four quadrants; no HSM, guarding, ridigity, or rebound tenderness Ext: no edema, well perfused with 2+ pulses, Skin: no rash or lesions noted Lymph: no LAD  Data: Lab Results  Component Value Date   WBC 6.9 05/19/2015   HGB 13.6 05/19/2015   HCT 40.1 05/19/2015   MCV 91.8 05/19/2015   PLT 192 05/19/2015    Recent Labs Lab 05/18/15 1829 05/19/15 0637  HGB 13.6 13.6   Lab Results  Component Value Date   NA 140 05/18/2015   K 3.7 05/18/2015   CL 105 05/18/2015   CO2 24 05/18/2015   BUN 20 05/18/2015   CREATININE 0.76 05/18/2015   Lab Results  Component Value Date   ALT 12* 05/18/2015   AST 22 05/18/2015   ALKPHOS 43 05/18/2015   BILITOT 0.2* 05/18/2015   No results for input(s): APTT, INR, PTT in the last 168 hours.   Assessment/Plan: Gloria Bell is a 64 y.o. female with LGIB x 1, now resovled since yesterday and Hgb normal.  Well established diverticular disease and could not complete last colon due to this with unremarkable virtual colon. Likely resolved diverticular bleed. Also some mild LLQ pain.   Recommendations: - monitor Hgb until stable - ok for d/c in am if no further bleeding. - ok to treat for diverticulitis with empirric abx, would not get CT since approx 10 in past 3 years, and no suggestion clinicaly of complicated diverticulitis.    Thank you for the consult. Please call with questions or  concerns.  REIN, Grace Blight, MD

## 2015-05-19 NOTE — Progress Notes (Signed)
GI Note:  Full consult to follow.   Bleedign has stopped, one episode yesterday, non since and Hgb nml.  LLQ pain, with hx of diverticulitis.   Recs: - no plans for colonosocpy, f/u with GI doctor in [place] - ok to treat empirically for d-ticitis given hx adn LLQ pain, would not get CT since she has been scanned so many times in past one or two years and only one of many studes actually showed diverticulitis.

## 2015-05-20 LAB — BASIC METABOLIC PANEL
ANION GAP: 6 (ref 5–15)
BUN: 11 mg/dL (ref 6–20)
CALCIUM: 8.7 mg/dL — AB (ref 8.9–10.3)
CO2: 25 mmol/L (ref 22–32)
Chloride: 110 mmol/L (ref 101–111)
Creatinine, Ser: 0.64 mg/dL (ref 0.44–1.00)
GFR calc non Af Amer: >60 mL/min (ref >60)
Glucose, Bld: 106 mg/dL — ABNORMAL HIGH (ref 65–99)
POTASSIUM: 3.8 mmol/L (ref 3.5–5.1)
Sodium: 141 mmol/L (ref 135–145)

## 2015-05-20 LAB — CBC
HCT: 41.7 % (ref 35.0–47.0)
HEMOGLOBIN: 14.3 g/dL (ref 12.0–16.0)
MCH: 31.9 pg (ref 26.0–34.0)
MCHC: 34.4 g/dL (ref 32.0–36.0)
MCV: 92.5 fL (ref 80.0–100.0)
Platelets: 208 10*3/uL (ref 150–440)
RBC: 4.5 MIL/uL (ref 3.80–5.20)
RDW: 13 % (ref 11.5–14.5)
WBC: 6.9 10*3/uL (ref 3.6–11.0)

## 2015-05-20 MED ORDER — AMOXICILLIN-POT CLAVULANATE 875-125 MG PO TABS: 1.0000 | ORAL_TABLET | Freq: Two times a day (BID) | ORAL | Status: DC

## 2015-05-20 NOTE — Discharge Instructions (Signed)
°  DIET:  Regular diet. Soft foods. Continue prior bowel regimen.  DISCHARGE CONDITION:  Fair  ACTIVITY:  Activity as tolerated  OXYGEN:  Home Oxygen: No.   Oxygen Delivery: room air  DISCHARGE LOCATION:  home   If you experience worsening of your admission symptoms, develop shortness of breath, life threatening emergency, suicidal or homicidal thoughts you must seek medical attention immediately by calling 911 or calling your MD immediately  if symptoms less severe.  You Must read complete instructions/literature along with all the possible adverse reactions/side effects for all the Medicines you take and that have been prescribed to you. Take any new Medicines after you have completely understood and accpet all the possible adverse reactions/side effects.   Please note  You were cared for by a hospitalist during your hospital stay. If you have any questions about your discharge medications or the care you received while you were in the hospital after you are discharged, you can call the unit and asked to speak with the hospitalist on call if the hospitalist that took care of you is not available. Once you are discharged, your primary care physician will handle any further medical issues. Please note that NO REFILLS for any discharge medications will be authorized once you are discharged, as it is imperative that you return to your primary care physician (or establish a relationship with a primary care physician if you do not have one) for your aftercare needs so that they can reassess your need for medications and monitor your lab values.

## 2015-05-20 NOTE — Progress Notes (Signed)
Patient Vss, no complaints of pain, alert and oriented x4, no reported rectal bleeding this morning. Patient received discharge instruction and verbalized understanding.

## 2015-05-23 NOTE — Discharge Summary (Signed)
[place] [facility] [place] NAME: Gloria Bell    MR#:  [id number]  DATE OF BIRTH:  05-20-1951  DATE OF ADMISSION:  05/18/2015 ADMITTING PHYSICIAN: Baxter Hire, MD  DATE OF DISCHARGE: 05/20/2015  PRIMARY CARE PHYSICIAN: Leonides Sake, MD    ADMISSION DIAGNOSIS:  Acute lower GI bleeding [K92.2]  DISCHARGE DIAGNOSIS:  Active Problems:   GI bleed   SECONDARY DIAGNOSIS:   Past Medical History  Diagnosis Date  . Hypertension   . Arthritis   . Hyperlipidemia   . Osteoporosis   . Diverticulitis   . Diverticulosis     HOSPITAL COURSE:   This 64 year old woman with history of significant diverticulosis with recurrent bouts of diverticulitis and chronic constipation due to slow transit presents with 1 episode of large volume bright red blood per rectum on 8/27  #1 GI bleed: Likely diverticular. She does have a history of diverticulosis. She is not having fevers or leukocytosis. She was treated with IV Invanz during the hospitalization for possible diverticulitis. She was seen by gastroenterology Dr. Rayann Heman. He recommended against CT scan as she has had many of these tests over the past few years. Also no invasive procedures were recommended. She had no further bleeding after admission. Hemoglobin stable not requiring transfusion. Blood pressure stable throughout the admission. As she has not seen any further bleeding for 24 hours she is being discharged. She'll follow up with her outpatient gastroenterologist later this week.  #2 possible diverticulitis: She does have a history of diverticulitis and abscess. She was treated with Invanz IV during the hospitalization and then transition to Augmentin as she has an allergy to Flagyl. She will complete a 10 day course and follow up with her outpatient gastroenterologist.  #3 chronic constipation: Continue home regimen. She states that she has a long history of  constipation due to both slow transit and now scarring due to adhesions and chronic diverticulosis/diverticulitis. She follows closely with her gastroenterologist for this. She does not feel constipated at the time of discharge.  #4 hypertension: Controlled continue home medications   DISCHARGE CONDITIONS:   Stable  CONSULTS OBTAINED:  Treatment Team:  Josefine Class, MD  DRUG ALLERGIES:   Allergies  Allergen Reactions  . Ampicillin Nausea And Vomiting  . Cephalexin Nausea And Vomiting  . Ciprofloxacin Dermatitis    Pt states felt like "burning under her skin all over"  . Codeine Nausea And Vomiting  . Flagyl [Metronidazole] Dermatitis    States previously had n/v to this med then had a rxt with "burning skin all over"  . Oxycodone-Acetaminophen Nausea And Vomiting  . Sulfa Antibiotics Nausea And Vomiting  . Vicodin [Hydrocodone-Acetaminophen] Nausea And Vomiting    DISCHARGE MEDICATIONS:   Discharge Medication List as of 05/20/2015 10:29 AM    START taking these medications   Details  amoxicillin-clavulanate (AUGMENTIN) 875-125 MG per tablet Take 1 tablet by mouth 2 (two) times daily., Starting 05/20/2015, Until Discontinued, Print      CONTINUE these medications which have NOT CHANGED   Details  losartan (COZAAR) 50 MG tablet Take 50 mg by mouth daily., Until Discontinued, Historical Med    metoprolol succinate (TOPROL-XL) 25 MG 24 hr tablet Take 25 mg by mouth daily., Until Discontinued, Historical Med    Multiple Vitamin (MULTIVITAMIN WITH MINERALS) TABS tablet Take 1 tablet by mouth daily., Until Discontinued, Historical Med    polyethylene glycol (MIRALAX / GLYCOLAX) packet Take 17 g  by mouth 2 (two) times daily. Takes at 3 pm and 7 pm, Until Discontinued, Historical Med    simvastatin (ZOCOR) 20 MG tablet Take 20 mg by mouth daily., Until Discontinued, Historical Med    zolpidem (AMBIEN) 10 MG tablet Take 10 mg by mouth at bedtime as needed for sleep.,  Until Discontinued, Historical Med      STOP taking these medications     acetaminophen (TYLENOL) 500 MG tablet      ondansetron (ZOFRAN) 4 MG tablet          DISCHARGE INSTRUCTIONS:   See AVS  Today   CHIEF COMPLAINT:   Chief Complaint  Patient presents with  . Abdominal Pain  . Rectal Bleeding    HISTORY OF PRESENT ILLNESS:  Started have left lower quadrant abdominal pain. Describes as non radiating and burning sensation. This was followed by a bowel movement that was grossly bloody. Has extesive history of bowel issues including diverticulosis.   VITAL SIGNS:  Blood pressure 148/66, pulse 72, temperature 98.8 F (37.1 C), temperature source Oral, resp. rate 18, height 5\' 6"  (1.676 m), weight 79.379 kg (175 lb), SpO2 100 %.  I/O:  No intake or output data in the 24 hours ending 05/23/15 1338  PHYSICAL EXAMINATION:  GENERAL:  64 y.o.-year-old patient lying in the bed with no acute distress.  LUNGS: Normal breath sounds bilaterally, no wheezing, rales,rhonchi or crepitation. No use of accessory muscles of respiration.  CARDIOVASCULAR: S1, S2 normal. No murmurs, rubs, or gallops.  ABDOMEN: Soft, non-tender, non-distended. Bowel sounds present. No organomegaly or mass.  EXTREMITIES: No pedal edema, cyanosis, or clubbing.  NEUROLOGIC: Cranial nerves II through XII are intact. Muscle strength 5/5 in all extremities. Sensation intact. Gait not checked.  PSYCHIATRIC: The patient is alert and oriented x 3.  SKIN: No obvious rash, lesion, or ulcer.   DATA REVIEW:   CBC  Recent Labs Lab 05/20/15 0646  WBC 6.9  HGB 14.3  HCT 41.7  PLT 208    Chemistries   Recent Labs Lab 05/18/15 1829 05/20/15 0646  NA 140 141  K 3.7 3.8  CL 105 110  CO2 24 25  GLUCOSE 173* 106*  BUN 20 11  CREATININE 0.76 0.64  CALCIUM 9.2 8.7*  AST 22  --   ALT 12*  --   ALKPHOS 43  --   BILITOT 0.2*  --     Cardiac Enzymes No results for input(s): TROPONINI in the last 168  hours.  Microbiology Results  Results for orders placed or performed during the hospital encounter of 02/09/15  Urine culture     Status: None   Collection Time: 02/09/15  2:33 PM  Result Value Ref Range Status   Specimen Description URINE, RANDOM  Final   Special Requests NONE  Final   Culture NO GROWTH 2 DAYS  Final   Report Status 02/11/2015 FINAL  Final    RADIOLOGY:  No results found.  EKG:   Orders placed or performed during the hospital encounter of 12/16/14  . ED EKG  . ED EKG  . EKG 12-Lead  . EKG 12-Lead  . EKG    Management plans discussed with the patient, family and they are in agreement.  CODE STATUS: full  TOTAL TIME TAKING CARE OF THIS PATIENT: 35 minutes.  Greater than 50% of time spent in care coordination and counseling.  Myrtis Ser M.D on 05/23/2015 at 1:38 PM  Between 7am to 6pm - Pager - [phone]  After  6pm go to [email or web address] - password [email or web address] [facility]  [facility]  Office  [phone]  CC: Primary care physician; Leonides Sake, MD

## 2015-08-20 ENCOUNTER — Emergency Department (HOSPITAL_COMMUNITY)
Admission: EM | Admit: 2015-08-20 | Discharge: 2015-08-20 | Disposition: A | Discharge status: Home / Self Care | Payer: BLUE CROSS/BLUE SHIELD | Attending: Emergency Medicine | Admitting: Emergency Medicine

## 2015-08-20 ENCOUNTER — Encounter (HOSPITAL_COMMUNITY): Payer: Self-pay | Admitting: Emergency Medicine

## 2015-08-20 DIAGNOSIS — Z9049 Acquired absence of other specified parts of digestive tract: Secondary | ICD-10-CM | POA: Diagnosis not present

## 2015-08-20 DIAGNOSIS — Z79899 Other long term (current) drug therapy: Secondary | ICD-10-CM | POA: Insufficient documentation

## 2015-08-20 DIAGNOSIS — Z8639 Personal history of other endocrine, nutritional and metabolic disease: Secondary | ICD-10-CM | POA: Diagnosis not present

## 2015-08-20 DIAGNOSIS — Z8739 Personal history of other diseases of the musculoskeletal system and connective tissue: Secondary | ICD-10-CM | POA: Diagnosis not present

## 2015-08-20 DIAGNOSIS — K5731 Diverticulosis of large intestine without perforation or abscess with bleeding: Secondary | ICD-10-CM | POA: Insufficient documentation

## 2015-08-20 DIAGNOSIS — Z90710 Acquired absence of both cervix and uterus: Secondary | ICD-10-CM | POA: Insufficient documentation

## 2015-08-20 DIAGNOSIS — K649 Unspecified hemorrhoids: Secondary | ICD-10-CM | POA: Diagnosis not present

## 2015-08-20 DIAGNOSIS — K625 Hemorrhage of anus and rectum: Secondary | ICD-10-CM | POA: Diagnosis not present

## 2015-08-20 DIAGNOSIS — I10 Essential (primary) hypertension: Secondary | ICD-10-CM | POA: Insufficient documentation

## 2015-08-20 LAB — COMPREHENSIVE METABOLIC PANEL
ALK PHOS: 50 U/L (ref 38–126)
ALT: 18 U/L (ref 14–54)
AST: 15 U/L (ref 15–41)
Albumin: 4.4 g/dL (ref 3.5–5.0)
Anion gap: 7 (ref 5–15)
BILIRUBIN TOTAL: 0.5 mg/dL (ref 0.3–1.2)
BUN: 14 mg/dL (ref 6–20)
CALCIUM: 9.6 mg/dL (ref 8.9–10.3)
CO2: 25 mmol/L (ref 22–32)
CREATININE: 0.8 mg/dL (ref 0.44–1.00)
Chloride: 106 mmol/L (ref 101–111)
GFR calc Af Amer: >60 mL/min (ref >60)
Glucose, Bld: 153 mg/dL — ABNORMAL HIGH (ref 65–99)
Potassium: 3.7 mmol/L (ref 3.5–5.1)
Sodium: 138 mmol/L (ref 135–145)
TOTAL PROTEIN: 6.9 g/dL (ref 6.5–8.1)

## 2015-08-20 LAB — URINALYSIS, ROUTINE W REFLEX MICROSCOPIC
BILIRUBIN URINE: NEGATIVE
Glucose, UA: 100 mg/dL — AB
Hgb urine dipstick: NEGATIVE
KETONES UR: NEGATIVE mg/dL
NITRITE: NEGATIVE
PROTEIN: NEGATIVE mg/dL
Specific Gravity, Urine: 1.018 (ref 1.005–1.030)
pH: 6 (ref 5.0–8.0)

## 2015-08-20 LAB — CBC
HCT: 40.6 % (ref 36.0–46.0)
Hemoglobin: 14 g/dL (ref 12.0–15.0)
MCH: 31.3 pg (ref 26.0–34.0)
MCHC: 34.5 g/dL (ref 30.0–36.0)
MCV: 90.6 fL (ref 78.0–100.0)
PLATELETS: 217 10*3/uL (ref 150–400)
RBC: 4.48 MIL/uL (ref 3.87–5.11)
RDW: 12.4 % (ref 11.5–15.5)
WBC: 6.1 10*3/uL (ref 4.0–10.5)

## 2015-08-20 LAB — POC OCCULT BLOOD, ED: Fecal Occult Bld: NEGATIVE

## 2015-08-20 LAB — URINE MICROSCOPIC-ADD ON: RBC / HPF: NONE SEEN RBC/hpf (ref 0–5)

## 2015-08-20 LAB — LIPASE, BLOOD: Lipase: 26 U/L (ref 11–51)

## 2015-08-20 MED ORDER — HYDROCODONE-ACETAMINOPHEN 5-325 MG PO TABS: 2.0000 | ORAL_TABLET | ORAL | Status: DC | PRN

## 2015-08-20 NOTE — ED Notes (Signed)
Pt states around 1200pm she had a sudden onset of LLQ pain. Pt has a history of diverticulitis. Pt states it feels like a flare up. Pr denies any n/v/d.

## 2015-08-20 NOTE — ED Provider Notes (Signed)
CSN: [id number]     Arrival date & time 08/20/15  1450 History   First MD Initiated Contact with Patient 08/20/15 1907     Chief Complaint  Patient presents with  . Rectal Bleeding  . Abdominal Pain     (Consider location/radiation/quality/duration/timing/severity/associated sxs/prior Treatment) HPI    Gloria Bell Wilfred Curtis is a 64 y.o. femalePresents for evaluation of an episode of rectal bleeding, today at noon. This was a single episode of bright red blood per rectum. This was preceded by 3 days of left lower quadrant abdominal pain. She saw her PCP yesterday for same, and was treated with Augmentin for suspected diverticulitis. Last episode of diverticulitis was about 2 months ago and was associated with rectal bleeding at that time. She improved with symptomatic treatment, including antibiotic. She denies fever, chills, nausea, vomiting, diarrhea, constipation, weakness or dizziness. There are no other no modifying factors.   Past Medical History  Diagnosis Date  . Hypertension   . Arthritis   . Hyperlipidemia   . Osteoporosis   . Diverticulitis   . Diverticulosis    Past Surgical History  Procedure Laterality Date  . Elbow fracture surgery    . Knee surgery    . Other surgical history      had to be on percocet for a year and it slowed down her "gut"   . Cholecystectomy    . Abdominal hysterectomy    . Appendectomy     Family History  Problem Relation Age of Onset  . Heart disease Father     heart attack  . Diabetes Father   . Cancer Paternal Grandfather     prostate   Social History  Substance Use Topics  . Smoking status: Never Smoker   . Smokeless tobacco: Never Used  . Alcohol Use: No   OB History    No data available     Review of Systems  All other systems reviewed and are negative.     Allergies  Ampicillin; Cephalexin; Ciprofloxacin; Codeine; Flagyl; Oxycodone-acetaminophen; Sulfa antibiotics; and Vicodin  Home Medications   Prior to  Admission medications   Medication Sig Start Date End Date Taking? Authorizing Provider  amoxicillin-clavulanate (AUGMENTIN) 875-125 MG per tablet Take 1 tablet by mouth 2 (two) times daily. Patient taking differently: Take 1 tablet by mouth 2 (two) times daily. Started medication on 08-19-15 05/20/15  Yes Aldean Jewett, MD  losartan (COZAAR) 50 MG tablet Take 50 mg by mouth daily.   Yes Historical Provider, MD  metoprolol succinate (TOPROL-XL) 25 MG 24 hr tablet Take 25 mg by mouth daily.   Yes Historical Provider, MD  Multiple Vitamin (MULTIVITAMIN WITH MINERALS) TABS tablet Take 1 tablet by mouth daily.   Yes Historical Provider, MD  polyethylene glycol (MIRALAX / GLYCOLAX) packet Take 17 g by mouth 2 (two) times daily. Takes at 3 pm and 7 pm   Yes Historical Provider, MD  zolpidem (AMBIEN) 10 MG tablet Take 10 mg by mouth at bedtime as needed for sleep.   Yes Historical Provider, MD   BP 130/77 mmHg  Pulse 96  Temp(Src) 97.9 F (36.6 C) (Oral)  Resp 20  Wt 170 lb 4 oz (77.225 kg)  SpO2 95% Physical Exam  Constitutional: She is oriented to person, place, and time. She appears well-developed and well-nourished.  HENT:  Head: Normocephalic and atraumatic.  Right Ear: External ear normal.  Left Ear: External ear normal.  Eyes: Conjunctivae and EOM are normal. Pupils are equal, round,  and reactive to light.  Neck: Normal range of motion and phonation normal. Neck supple.  Cardiovascular: Normal rate, regular rhythm and normal heart sounds.   Pulmonary/Chest: Effort normal and breath sounds normal. She exhibits no bony tenderness.  Abdominal: Soft. She exhibits no distension. There is no tenderness.  Genitourinary:  Anus with non-distended hemorrhoids. Mild pain on anal digital examination. No stool or blood in the rectum.  Musculoskeletal: Normal range of motion.  Neurological: She is alert and oriented to person, place, and time. No cranial nerve deficit or sensory deficit. She  exhibits normal muscle tone. Coordination normal.  Skin: Skin is warm, dry and intact.  Psychiatric: She has a normal mood and affect. Her behavior is normal. Judgment and thought content normal.  Nursing note and vitals reviewed.   ED Course  Procedures (including critical care time) Medications - No data to display  Patient Vitals for the past 24 hrs:  BP Temp Temp src Pulse Resp SpO2 Weight  08/20/15 1511 130/77 mmHg 97.9 F (36.6 C) Oral 96 20 95 % 170 lb 4 oz (77.225 kg)    7:40 PM Reevaluation with update and discussion. After initial assessment and treatment, an updated evaluation reveals patient remains comfortable. Findings discussed with the patient, all questions were answered. Willliam Pettet L     Labs Review Labs Reviewed  COMPREHENSIVE METABOLIC PANEL - Abnormal; Notable for the following:    Glucose, Bld 153 (*)    All other components within normal limits  URINALYSIS, ROUTINE W REFLEX MICROSCOPIC (NOT AT [facility]) - Abnormal; Notable for the following:    Glucose, UA 100 (*)    Leukocytes, UA TRACE (*)    All other components within normal limits  URINE MICROSCOPIC-ADD ON - Abnormal; Notable for the following:    Squamous Epithelial / LPF 0-5 (*)    Bacteria, UA RARE (*)    All other components within normal limits  LIPASE, BLOOD  CBC    Imaging Review No results found. I have personally reviewed and evaluated these images and lab results as part of my medical decision-making.   EKG Interpretation None      MDM   Final diagnoses:  Rectal bleeding  Diverticulosis of large intestine with hemorrhage    Recurrent symptoms of diverticulitis, clinically stable today. No fever and no white blood cell count elevation. Single episode of rectal bleeding earlier today, with essentially negative rectal exam. Doubt significant rectal bleeding, severe diverticulitis, or metabolic instability.   Nursing Notes Reviewed/ Care Coordinated Applicable Imaging  Reviewed Interpretation of Laboratory Data incorporated into ED treatment  The patient appears reasonably screened and/or stabilized for discharge and I doubt any other medical condition or other [facility] requiring further screening, evaluation, or treatment in the ED at this time prior to discharge.  Plan: Home Medications- Norco; Home Treatments- Heat; return here if the recommended treatment, does not improve the symptoms; Recommended follow up- PCP prn     Daleen Bo, MD 08/21/15 [phone]

## 2015-08-20 NOTE — Discharge Instructions (Signed)
Gastrointestinal Bleeding Gastrointestinal (GI) bleeding means there is bleeding somewhere along the digestive tract, between the mouth and anus. CAUSES  There are many different problems that can cause GI bleeding. Possible causes include:  Esophagitis. This is inflammation, irritation, or swelling of the esophagus.  Hemorrhoids.These are veins that are full of blood (engorged) in the rectum. They cause pain, inflammation, and may bleed.  Anal fissures.These are areas of painful tearing which may bleed. They are often caused by passing hard stool.  Diverticulosis.These are pouches that form on the colon over time, with age, and may bleed significantly.  Diverticulitis.This is inflammation in areas with diverticulosis. It can cause pain, fever, and bloody stools, although bleeding is rare.  Polyps and cancer. Colon cancer often starts out as precancerous polyps.  Gastritis and ulcers.Bleeding from the upper gastrointestinal tract (near the stomach) may travel through the intestines and produce black, sometimes tarry, often bad smelling stools. In certain cases, if the bleeding is fast enough, the stools may not be black, but red. This condition may be life-threatening. SYMPTOMS   Vomiting bright red blood or material that looks like coffee grounds.  Bloody, black, or tarry stools. DIAGNOSIS  Your caregiver may diagnose your condition by taking your history and performing a physical exam. More tests may be needed, including:  X-rays and other imaging tests.  Esophagogastroduodenoscopy (EGD). This test uses a flexible, lighted tube to look at your esophagus, stomach, and small intestine.  Colonoscopy. This test uses a flexible, lighted tube to look at your colon. TREATMENT  Treatment depends on the cause of your bleeding.   For bleeding from the esophagus, stomach, small intestine, or colon, the caregiver doing your EGD or colonoscopy may be able to stop the bleeding as part of  the procedure.  Inflammation or infection of the colon can be treated with medicines.  Many rectal problems can be treated with creams, suppositories, or warm baths.  Surgery is sometimes needed.  Blood transfusions are sometimes needed if you have lost a lot of blood. If bleeding is slow, you may be allowed to go home. If there is a lot of bleeding, you will need to stay in the hospital for observation. HOME CARE INSTRUCTIONS   Take any medicines exactly as prescribed.  Keep your stools soft by eating foods that are high in fiber. These foods include whole grains, legumes, fruits, and vegetables. Prunes (1 to 3 a day) work well for many people.  Drink enough fluids to keep your urine clear or pale yellow. SEEK IMMEDIATE MEDICAL CARE IF:   Your bleeding increases.  You feel lightheaded, weak, or you faint.  You have severe cramps in your back or abdomen.  You pass large blood clots in your stool.  Your problems are getting worse. MAKE SURE YOU:   Understand these instructions.  Will watch your condition.  Will get help right away if you are not doing well or get worse.   This information is not intended to replace advice given to you by your health care provider. Make sure you discuss any questions you have with your health care provider.   Document Released: 09/04/2000 Document Revised: 08/24/2012 Document Reviewed: 02/25/2015 Elsevier Interactive Patient Education 2016 [employer].  Diverticulosis Diverticulosis is the condition that develops when small pouches (diverticula) form in the wall of your colon. Your colon, or large intestine, is where water is absorbed and stool is formed. The pouches form when the inside layer of your colon pushes through weak spots  in the outer layers of your colon. CAUSES  No one knows exactly what causes diverticulosis. RISK FACTORS  Being older than 27. Your risk for this condition increases with age. Diverticulosis is rare in  people younger than 40 years. By age 8, almost everyone has it.  Eating a low-fiber diet.  Being frequently constipated.  Being overweight.  Not getting enough exercise.  Smoking.  Taking over-the-counter pain medicines, like aspirin and ibuprofen. SYMPTOMS  Most people with diverticulosis do not have symptoms. DIAGNOSIS  Because diverticulosis often has no symptoms, health care providers often discover the condition during an exam for other colon problems. In many cases, a health care provider will diagnose diverticulosis while using a flexible scope to examine the colon (colonoscopy). TREATMENT  If you have never developed an infection related to diverticulosis, you may not need treatment. If you have had an infection before, treatment may include:  Eating more fruits, vegetables, and grains.  Taking a fiber supplement.  Taking a live bacteria supplement (probiotic).  Taking medicine to relax your colon. HOME CARE INSTRUCTIONS   Drink at least 6-8 glasses of water each day to prevent constipation.  Try not to strain when you have a bowel movement.  Keep all follow-up appointments. If you have had an infection before:  Increase the fiber in your diet as directed by your health care provider or dietitian.  Take a dietary fiber supplement if your health care provider approves.  Only take medicines as directed by your health care provider. SEEK MEDICAL CARE IF:   You have abdominal pain.  You have bloating.  You have cramps.  You have not gone to the bathroom in 3 days. SEEK IMMEDIATE MEDICAL CARE IF:   Your pain gets worse.  Yourbloating becomes very bad.  You have a fever or chills, and your symptoms suddenly get worse.  You begin vomiting.  You have bowel movements that are bloody or black. MAKE SURE YOU:  Understand these instructions.  Will watch your condition.  Will get help right away if you are not doing well or get worse.   This  information is not intended to replace advice given to you by your health care provider. Make sure you discuss any questions you have with your health care provider.   Document Released: 06/04/2004 Document Revised: 09/12/2013 Document Reviewed: 08/02/2013 Elsevier Interactive Patient Education [employer].

## 2015-08-20 NOTE — ED Notes (Signed)
Assisted MD in collecting blood occult

## 2015-12-26 DIAGNOSIS — H1045 Other chronic allergic conjunctivitis: Secondary | ICD-10-CM | POA: Diagnosis not present

## 2015-12-26 DIAGNOSIS — H5712 Ocular pain, left eye: Secondary | ICD-10-CM | POA: Diagnosis not present

## 2016-02-04 DIAGNOSIS — E039 Hypothyroidism, unspecified: Secondary | ICD-10-CM | POA: Diagnosis not present

## 2016-02-04 DIAGNOSIS — E7800 Pure hypercholesterolemia, unspecified: Secondary | ICD-10-CM | POA: Diagnosis not present

## 2016-02-04 DIAGNOSIS — Z79899 Other long term (current) drug therapy: Secondary | ICD-10-CM | POA: Diagnosis not present

## 2016-02-14 DIAGNOSIS — E7800 Pure hypercholesterolemia, unspecified: Secondary | ICD-10-CM | POA: Diagnosis not present

## 2016-02-14 DIAGNOSIS — I10 Essential (primary) hypertension: Secondary | ICD-10-CM | POA: Diagnosis not present

## 2016-02-14 DIAGNOSIS — G4700 Insomnia, unspecified: Secondary | ICD-10-CM | POA: Diagnosis not present

## 2016-02-14 DIAGNOSIS — E039 Hypothyroidism, unspecified: Secondary | ICD-10-CM | POA: Diagnosis not present

## 2016-02-24 DIAGNOSIS — K5792 Diverticulitis of intestine, part unspecified, without perforation or abscess without bleeding: Secondary | ICD-10-CM | POA: Diagnosis not present

## 2016-02-24 DIAGNOSIS — Z6828 Body mass index (BMI) 28.0-28.9, adult: Secondary | ICD-10-CM | POA: Diagnosis not present

## 2016-03-05 DIAGNOSIS — Z1231 Encounter for screening mammogram for malignant neoplasm of breast: Secondary | ICD-10-CM | POA: Diagnosis not present

## 2016-03-06 DIAGNOSIS — S61411A Laceration without foreign body of right hand, initial encounter: Secondary | ICD-10-CM | POA: Diagnosis not present

## 2016-03-26 DIAGNOSIS — H11442 Conjunctival cysts, left eye: Secondary | ICD-10-CM | POA: Diagnosis not present

## 2016-04-01 DIAGNOSIS — Z6828 Body mass index (BMI) 28.0-28.9, adult: Secondary | ICD-10-CM | POA: Diagnosis not present

## 2016-04-01 DIAGNOSIS — M79621 Pain in right upper arm: Secondary | ICD-10-CM | POA: Diagnosis not present

## 2016-04-27 DIAGNOSIS — H11442 Conjunctival cysts, left eye: Secondary | ICD-10-CM | POA: Diagnosis not present

## 2016-05-04 DIAGNOSIS — Z6828 Body mass index (BMI) 28.0-28.9, adult: Secondary | ICD-10-CM | POA: Diagnosis not present

## 2016-05-04 DIAGNOSIS — E663 Overweight: Secondary | ICD-10-CM | POA: Diagnosis not present

## 2016-05-04 DIAGNOSIS — K5790 Diverticulosis of intestine, part unspecified, without perforation or abscess without bleeding: Secondary | ICD-10-CM | POA: Diagnosis not present

## 2016-05-04 DIAGNOSIS — R110 Nausea: Secondary | ICD-10-CM | POA: Diagnosis not present

## 2016-05-15 DIAGNOSIS — H11442 Conjunctival cysts, left eye: Secondary | ICD-10-CM | POA: Diagnosis not present

## 2016-05-18 DIAGNOSIS — M9901 Segmental and somatic dysfunction of cervical region: Secondary | ICD-10-CM | POA: Diagnosis not present

## 2016-05-18 DIAGNOSIS — M531 Cervicobrachial syndrome: Secondary | ICD-10-CM | POA: Diagnosis not present

## 2016-05-26 DIAGNOSIS — M531 Cervicobrachial syndrome: Secondary | ICD-10-CM | POA: Diagnosis not present

## 2016-05-26 DIAGNOSIS — M9901 Segmental and somatic dysfunction of cervical region: Secondary | ICD-10-CM | POA: Diagnosis not present

## 2016-07-08 DIAGNOSIS — M9901 Segmental and somatic dysfunction of cervical region: Secondary | ICD-10-CM | POA: Diagnosis not present

## 2016-07-08 DIAGNOSIS — M531 Cervicobrachial syndrome: Secondary | ICD-10-CM | POA: Diagnosis not present

## 2016-07-15 DIAGNOSIS — M9901 Segmental and somatic dysfunction of cervical region: Secondary | ICD-10-CM | POA: Diagnosis not present

## 2016-07-15 DIAGNOSIS — M47814 Spondylosis without myelopathy or radiculopathy, thoracic region: Secondary | ICD-10-CM | POA: Diagnosis not present

## 2016-07-15 DIAGNOSIS — M5417 Radiculopathy, lumbosacral region: Secondary | ICD-10-CM | POA: Diagnosis not present

## 2016-07-15 DIAGNOSIS — M545 Low back pain: Secondary | ICD-10-CM | POA: Diagnosis not present

## 2016-07-15 DIAGNOSIS — M9903 Segmental and somatic dysfunction of lumbar region: Secondary | ICD-10-CM | POA: Diagnosis not present

## 2016-07-15 DIAGNOSIS — M531 Cervicobrachial syndrome: Secondary | ICD-10-CM | POA: Diagnosis not present

## 2016-07-15 DIAGNOSIS — M5415 Radiculopathy, thoracolumbar region: Secondary | ICD-10-CM | POA: Diagnosis not present

## 2016-07-15 DIAGNOSIS — M9902 Segmental and somatic dysfunction of thoracic region: Secondary | ICD-10-CM | POA: Diagnosis not present

## 2016-07-15 DIAGNOSIS — M47812 Spondylosis without myelopathy or radiculopathy, cervical region: Secondary | ICD-10-CM | POA: Diagnosis not present

## 2016-07-21 DIAGNOSIS — M531 Cervicobrachial syndrome: Secondary | ICD-10-CM | POA: Diagnosis not present

## 2016-07-21 DIAGNOSIS — M9901 Segmental and somatic dysfunction of cervical region: Secondary | ICD-10-CM | POA: Diagnosis not present

## 2016-07-30 DIAGNOSIS — M9901 Segmental and somatic dysfunction of cervical region: Secondary | ICD-10-CM | POA: Diagnosis not present

## 2016-07-30 DIAGNOSIS — M531 Cervicobrachial syndrome: Secondary | ICD-10-CM | POA: Diagnosis not present

## 2016-08-07 DIAGNOSIS — Z0000 Encounter for general adult medical examination without abnormal findings: Secondary | ICD-10-CM | POA: Diagnosis not present

## 2016-08-07 DIAGNOSIS — E039 Hypothyroidism, unspecified: Secondary | ICD-10-CM | POA: Diagnosis not present

## 2016-08-07 DIAGNOSIS — E7800 Pure hypercholesterolemia, unspecified: Secondary | ICD-10-CM | POA: Diagnosis not present

## 2016-08-18 DIAGNOSIS — Z9181 History of falling: Secondary | ICD-10-CM | POA: Diagnosis not present

## 2016-08-18 DIAGNOSIS — Z1389 Encounter for screening for other disorder: Secondary | ICD-10-CM | POA: Diagnosis not present

## 2016-08-18 DIAGNOSIS — Z6829 Body mass index (BMI) 29.0-29.9, adult: Secondary | ICD-10-CM | POA: Diagnosis not present

## 2016-08-18 DIAGNOSIS — Z0000 Encounter for general adult medical examination without abnormal findings: Secondary | ICD-10-CM | POA: Diagnosis not present

## 2016-09-21 HISTORY — PX: TRANSURETHRAL RESECTION OF BLADDER TUMOR WITH GYRUS (TURBT-GYRUS): SHX6458

## 2017-01-25 DIAGNOSIS — K5792 Diverticulitis of intestine, part unspecified, without perforation or abscess without bleeding: Secondary | ICD-10-CM | POA: Diagnosis not present

## 2017-01-25 DIAGNOSIS — R350 Frequency of micturition: Secondary | ICD-10-CM | POA: Diagnosis not present

## 2017-01-25 DIAGNOSIS — K625 Hemorrhage of anus and rectum: Secondary | ICD-10-CM | POA: Diagnosis not present

## 2017-01-25 DIAGNOSIS — R1032 Left lower quadrant pain: Secondary | ICD-10-CM | POA: Diagnosis not present

## 2017-01-27 DIAGNOSIS — I10 Essential (primary) hypertension: Secondary | ICD-10-CM | POA: Diagnosis not present

## 2017-01-27 DIAGNOSIS — K644 Residual hemorrhoidal skin tags: Secondary | ICD-10-CM | POA: Diagnosis not present

## 2017-01-27 DIAGNOSIS — K625 Hemorrhage of anus and rectum: Secondary | ICD-10-CM | POA: Diagnosis not present

## 2017-01-27 DIAGNOSIS — K5792 Diverticulitis of intestine, part unspecified, without perforation or abscess without bleeding: Secondary | ICD-10-CM | POA: Diagnosis not present

## 2017-01-29 DIAGNOSIS — I10 Essential (primary) hypertension: Secondary | ICD-10-CM | POA: Diagnosis not present

## 2017-01-29 DIAGNOSIS — Z6829 Body mass index (BMI) 29.0-29.9, adult: Secondary | ICD-10-CM | POA: Diagnosis not present

## 2017-01-29 DIAGNOSIS — E663 Overweight: Secondary | ICD-10-CM | POA: Diagnosis not present

## 2017-01-29 DIAGNOSIS — E7800 Pure hypercholesterolemia, unspecified: Secondary | ICD-10-CM | POA: Diagnosis not present

## 2017-02-12 DIAGNOSIS — E7800 Pure hypercholesterolemia, unspecified: Secondary | ICD-10-CM | POA: Diagnosis not present

## 2017-02-12 DIAGNOSIS — I10 Essential (primary) hypertension: Secondary | ICD-10-CM | POA: Diagnosis not present

## 2017-02-16 DIAGNOSIS — E039 Hypothyroidism, unspecified: Secondary | ICD-10-CM | POA: Diagnosis not present

## 2017-02-16 DIAGNOSIS — E7800 Pure hypercholesterolemia, unspecified: Secondary | ICD-10-CM | POA: Diagnosis not present

## 2017-02-16 DIAGNOSIS — I10 Essential (primary) hypertension: Secondary | ICD-10-CM | POA: Diagnosis not present

## 2017-02-16 DIAGNOSIS — R52 Pain, unspecified: Secondary | ICD-10-CM | POA: Diagnosis not present

## 2017-02-18 DIAGNOSIS — F419 Anxiety disorder, unspecified: Secondary | ICD-10-CM | POA: Diagnosis not present

## 2017-02-18 DIAGNOSIS — R0789 Other chest pain: Secondary | ICD-10-CM | POA: Diagnosis not present

## 2017-02-18 DIAGNOSIS — R110 Nausea: Secondary | ICD-10-CM | POA: Diagnosis not present

## 2017-02-18 DIAGNOSIS — Z79899 Other long term (current) drug therapy: Secondary | ICD-10-CM | POA: Diagnosis not present

## 2017-02-18 DIAGNOSIS — M1990 Unspecified osteoarthritis, unspecified site: Secondary | ICD-10-CM | POA: Diagnosis not present

## 2017-02-18 DIAGNOSIS — I714 Abdominal aortic aneurysm, without rupture: Secondary | ICD-10-CM | POA: Diagnosis not present

## 2017-02-18 DIAGNOSIS — N329 Bladder disorder, unspecified: Secondary | ICD-10-CM | POA: Diagnosis not present

## 2017-02-18 DIAGNOSIS — R079 Chest pain, unspecified: Secondary | ICD-10-CM | POA: Diagnosis not present

## 2017-02-18 DIAGNOSIS — R0602 Shortness of breath: Secondary | ICD-10-CM | POA: Diagnosis not present

## 2017-02-18 DIAGNOSIS — I10 Essential (primary) hypertension: Secondary | ICD-10-CM | POA: Diagnosis not present

## 2017-02-22 ENCOUNTER — Ambulatory Visit (INDEPENDENT_AMBULATORY_CARE_PROVIDER_SITE_OTHER): Payer: BLUE CROSS/BLUE SHIELD | Admitting: Urology

## 2017-02-22 ENCOUNTER — Encounter: Payer: Self-pay | Admitting: Urology

## 2017-02-22 VITALS — BP 143/88 | HR 79 | Ht 66.0 in | Wt 178.0 lb

## 2017-02-22 DIAGNOSIS — N3289 Other specified disorders of bladder: Secondary | ICD-10-CM

## 2017-02-22 MED ORDER — LIDOCAINE HCL 2 % EX GEL
1.0000 "application " | Freq: Once | CUTANEOUS | Status: AC
  Administered 2017-02-22: 1 via URETHRAL

## 2017-02-22 MED ORDER — DOXYCYCLINE HYCLATE 100 MG PO TABS
100.0000 mg | ORAL_TABLET | Freq: Once | ORAL | Status: AC
  Administered 2017-02-22: 100 mg via ORAL

## 2017-02-22 NOTE — Progress Notes (Signed)
   02/22/17  CC: No chief complaint on file.   HPI: See H&P  Blood pressure (!) 143/88, pulse 79, height 5\' 6"  (1.676 m), weight 178 lb (80.7 kg). NED. A&Ox3.   No respiratory distress   Abd soft, NT, ND Normal external genitalia with patent urethral meatus  Cystoscopy Procedure Note  Patient identification was confirmed, informed consent was obtained, and patient was prepped using Betadine solution.  Lidocaine jelly was administered per urethral meatus.    Preoperative abx where received prior to procedure.    Procedure: - Flexible cystoscope introduced, without any difficulty.   - Thorough search of the bladder revealed:    normal urethral meatus    Papillary bladder lesion right posterior bladder wall, most consistent with urothelial carcinoma measuring approximately 1 cm    no stones    no ulcers     no tumors    no urethral polyps    no trabeculation  - Ureteral orifices were normal in position and appearance.  Mild trigonitis appreciated  Post-Procedure: - Patient tolerated the procedure well    Hollice Espy, MD

## 2017-02-22 NOTE — Progress Notes (Signed)
02/22/2017 3:42 PM   [place] 1950-10-23 [id number]  Referring provider: Leonides Sake, MD [place],  [id number]  No chief complaint on file.   HPI: 42 to female who presents today for further evaluation of incidental bladder lesion identified on CT scan.  She initially presented to the emergency room at [facility] on 02/18/2017 with chest pain and shortness of breath. A chest x-ray was obtained which showed questionable right free air under the hemidiaphragm. As such, a CT scan of the abdomen and pelvis was obtained for further workup with this. In fact, there was no free air and this was an artifact. Incidentally, an 8 mm polypoid lesion (right posterior) was identified within the bladder. She was advised to follow up with urology.  Never smoker.  She does have a long history of working in the [employer] but no known exposure to aniline dyes.  No history of recent gross hematuria.  Remote history of hemorrhagic cystitis 30 years ago.  No recent CT scan.    She does have chronic constipation.    No significant urinary frequency or urgency.  Occasional nocturia related to oral intake before bed.    Her workup for chest pain is otherwise negative.  This included negative EKG, troponins, etc.  PMH: Past Medical History:  Diagnosis Date  . Arthritis   . Diverticulitis   . Diverticulosis   . Hyperlipidemia   . Hypertension   . Osteoporosis     Surgical History: Past Surgical History:  Procedure Laterality Date  . ABDOMINAL HYSTERECTOMY    . APPENDECTOMY    . CHOLECYSTECTOMY    . ELBOW FRACTURE SURGERY    . KNEE SURGERY    . OTHER SURGICAL HISTORY     had to be on percocet for a year and it slowed down her "gut"     Home Medications:  Allergies as of 02/22/2017      Reactions   Ampicillin Nausea And Vomiting   Cephalexin Nausea And Vomiting   Ciprofloxacin Dermatitis   Pt states felt like "burning under her skin all  over"   Codeine Nausea And Vomiting   Flagyl [metronidazole] Dermatitis   States previously had n/v to this med then had a rxt with "burning skin all over"   Hydrochlorothiazide    Oxycodone-acetaminophen Nausea And Vomiting   Sulfa Antibiotics Nausea And Vomiting   Vicodin [hydrocodone-acetaminophen] Nausea And Vomiting      Medication List       Accurate as of 02/22/17  3:42 PM. Always use your most recent med list.          acetaminophen 500 MG tablet Commonly known as:  TYLENOL Take 500 mg by mouth every 6 (six) hours as needed for mild pain.   amoxicillin-clavulanate 875-125 MG tablet Commonly known as:  AUGMENTIN Take 1 tablet by mouth 2 (two) times daily.   losartan 100 MG tablet Commonly known as:  COZAAR Take 1 tablet by mouth daily.   multivitamin with minerals Tabs tablet Take 1 tablet by mouth daily.   polyethylene glycol packet Commonly known as:  MIRALAX / GLYCOLAX Take 17 g by mouth 2 (two) times daily. Takes at 3 pm and 7 pm   simvastatin 20 MG tablet Commonly known as:  ZOCOR Take 1 tablet by mouth daily.   TOPROL XL 50 MG 24 hr tablet Generic drug:  metoprolol succinate Take 1 tablet by mouth daily.   zolpidem 10 MG tablet Commonly  known as:  AMBIEN Take 10 mg by mouth at bedtime as needed for sleep.       Allergies:  Allergies  Allergen Reactions  . Ampicillin Nausea And Vomiting  . Cephalexin Nausea And Vomiting  . Ciprofloxacin Dermatitis    Pt states felt like "burning under her skin all over"  . Codeine Nausea And Vomiting  . Flagyl [Metronidazole] Dermatitis    States previously had n/v to this med then had a rxt with "burning skin all over"  . Hydrochlorothiazide   . Oxycodone-Acetaminophen Nausea And Vomiting  . Sulfa Antibiotics Nausea And Vomiting  . Vicodin [Hydrocodone-Acetaminophen] Nausea And Vomiting    Family History: Family History  Problem Relation Age of Onset  . Heart disease Father        heart attack  .  Diabetes Father   . Cancer Paternal Grandfather        prostate    Social History:  reports that she has never smoked. She has never used smokeless tobacco. She reports that she does not drink alcohol or use drugs.  ROS: UROLOGY Frequent Urination?: No Hard to postpone urination?: No Burning/pain with urination?: No Get up at night to urinate?: Yes Leakage of urine?: No Urine stream starts and stops?: No Trouble starting stream?: No Do you have to strain to urinate?: No Blood in urine?: No Urinary tract infection?: No Sexually transmitted disease?: No Injury to kidneys or bladder?: No Painful intercourse?: No Weak stream?: No Currently pregnant?: No Vaginal bleeding?: No Last menstrual period?: n  Gastrointestinal Nausea?: No Vomiting?: No Indigestion/heartburn?: No Diarrhea?: No Constipation?: No  Constitutional Fever: No Night sweats?: No Weight loss?: No Fatigue?: No  Skin Skin rash/lesions?: No Itching?: No  Eyes Blurred vision?: Yes Double vision?: No  Ears/Nose/Throat Sore throat?: No Sinus problems?: No  Hematologic/Lymphatic Swollen glands?: No Easy bruising?: No  Cardiovascular Leg swelling?: No Chest pain?: Yes  Respiratory Cough?: No Shortness of breath?: No  Endocrine Excessive thirst?: No  Musculoskeletal Back pain?: Yes Joint pain?: Yes  Neurological Headaches?: No Dizziness?: No  Psychologic Depression?: No Anxiety?: No  Physical Exam: BP (!) 143/88 (BP Location: Left Arm, Patient Position: Sitting, Cuff Size: Large)   Pulse 79   Ht 5\' 6"  (1.676 m)   Wt 178 lb (80.7 kg)   BMI 28.73 kg/m   Constitutional:  Alert and oriented, No acute distress. HEENT: [place] AT, moist mucus membranes.  Trachea midline, no masses. Cardiovascular: No clubbing, cyanosis, or edema. Respiratory: Normal respiratory effort, no increased work of breathing. GI: Abdomen is soft, nontender, nondistended, no abdominal masses GU: No CVA  tenderness.  Skin: No rashes, bruises or suspicious lesions. Lymph: No cervical or inguinal adenopathy. Neurologic: Grossly intact, no focal deficits, moving all 4 extremities. Psychiatric: Normal mood and affect.  Laboratory Data: Creatinine 0.75 Hemoglobin 13.9, hematocrit 41  Above labs drawn at [facility] on 02/18/2017  Urinalysis UA 02/18/2017 with rare bacteria, otherwise completely negative including no red blood cells.  Pertinent Imaging: 1. No intraperitoneal free air. The gas seen under the right hemidiaphragm on the chest x-ray earlier today is secondary to position of the hepatic flexure anterior to the liver up under the anterior aspect of the right hemi diaphragmatic dome. 2. 7-8 mm polypoid lesion posterior right bladder wall, highly suspicious for urothelial neoplasm. 3. Abdominal aortic atherosclerosis. I personally discussed the results of this study with Dr. Kennis Carina in the emergency department at approximately 1330 hours on 02/18/2017.   Electronically Signed By: Tamera Reason M.D. On:  02/18/2017 13:33  Result Narrative  CLINICAL DATA:Question intraperitoneal free air on chest x-ray.  EXAM: CT ABDOMEN AND PELVIS WITH CONTRAST  TECHNIQUE: Multidetector CT imaging of the abdomen and pelvis was performed using the standard protocol following bolus administration of intravenous contrast.  CONTRAST:100 cc Omnipaque 300  COMPARISON:None.  FINDINGS: Lower chest:Unremarkable.  Hepatobiliary: No focal abnormality within the liver parenchyma. Gallbladder surgically absent. No intrahepatic or extrahepatic biliary dilation.  Pancreas: No focal mass lesion. No dilatation of the main duct. No intraparenchymal cyst. No peripancreatic edema.  Spleen:No splenomegaly. No focal mass lesion.  Adrenals/Urinary Tract: No adrenal nodule or mass. Kidneys unremarkable. No evidence for hydroureter. 7 mm polypoid bladder wall lesion seen along the posterior  right bladder wall, just cranial to the UVJ (well demonstrated on coronal image 77 of series 400 also seen on axial image 86 of series 103).  Stomach/Bowel: Tiny hiatal hernia. Stomach otherwise unremarkable. Duodenum is normally positioned as is the ligament of Treitz. No small bowel wall thickening. No small bowel dilatation. The terminal ileum is normal. The appendix is not visualized, but there is no edema or inflammation in the region of the cecum. Diverticular changes are noted in the left colon without evidence of diverticulitis.  Vascular/Lymphatic: There is abdominal aortic atherosclerosis without aneurysm. There is no gastrohepatic or hepatoduodenal ligament lymphadenopathy. No intraperitoneal or retroperitoneal lymphadenopathy. No pelvic sidewall lymphadenopathy.  Reproductive: Uterus surgically absent.There is no adnexal mass.  Other: No intraperitoneal free fluid.  Musculoskeletal: Bone windows reveal no worrisome lytic or sclerotic osseous lesions.   Patient was able to bring the disc with her today from [facility] which was personally reviewed. I do see the lesion in question which is on the right posterior bladder wall highly concerning for carcinoma. I have offered her cystoscopy today to expedite workup.  Cystoscopy today confirms presence of approximately 1 cm papillary bladder tumor most consistent with bladder is normal.  Assessment & Plan:    1. Bladder mass 1 cm right posterior ladder wall mass most consistent with urothelial carcinoma  Recommend proceeding to the operating room for cystoscopy, TURBT, bilateral retrograde pyelograms, instillation of intravesical mitomycin. Risks and benefits of the procedure were reviewed today in detail including risk of bleeding, infection, damage to surrounding structures, bladder irritation.  All questions were answered.   - doxycycline (VIBRA-TABS) tablet 100 mg; Take 1 tablet (100 mg total) by mouth once. - lidocaine  (XYLOCAINE) 2 % jelly 1 application; Place 1 application into the urethra once.   Hollice Espy, MD  [facility] [address] [place], [place] [address] [phone]

## 2017-02-23 ENCOUNTER — Other Ambulatory Visit: Payer: Self-pay | Admitting: Radiology

## 2017-02-23 ENCOUNTER — Other Ambulatory Visit: Payer: BLUE CROSS/BLUE SHIELD

## 2017-02-23 ENCOUNTER — Telehealth: Payer: Self-pay | Admitting: Radiology

## 2017-02-23 DIAGNOSIS — N3289 Other specified disorders of bladder: Secondary | ICD-10-CM

## 2017-02-23 DIAGNOSIS — Z01818 Encounter for other preprocedural examination: Secondary | ICD-10-CM

## 2017-02-23 NOTE — Telephone Encounter (Signed)
Notified pt of TURBT scheduled with Dr Erlene Quan on 03/01/17, pre-admit testing appt on 02/25/17 @12 :66 & to call Friday prior to surgery for arrival time to SDS. Questions answered to pt's satisfaction. No further questions at this time. Pt voices understanding.

## 2017-02-23 NOTE — Progress Notes (Signed)
This encounter was created in error - please disregard.

## 2017-02-25 ENCOUNTER — Encounter
Admission: RE | Admit: 2017-02-25 | Discharge: 2017-02-25 | Disposition: A | Discharge status: Home / Self Care | Payer: BLUE CROSS/BLUE SHIELD | Source: Ambulatory Visit | Attending: Urology | Admitting: Urology

## 2017-02-25 DIAGNOSIS — N329 Bladder disorder, unspecified: Secondary | ICD-10-CM | POA: Insufficient documentation

## 2017-02-25 DIAGNOSIS — Z01818 Encounter for other preprocedural examination: Secondary | ICD-10-CM | POA: Diagnosis not present

## 2017-02-25 HISTORY — DX: Fall on and from ladder, initial encounter: W11.XXXA

## 2017-02-25 HISTORY — DX: Personal history of urinary calculi: Z87.442

## 2017-02-25 HISTORY — DX: Chronic kidney disease, unspecified: N18.9

## 2017-02-25 HISTORY — DX: Unspecified osteoarthritis, unspecified site: M19.90

## 2017-02-25 NOTE — Patient Instructions (Signed)
  Your procedure is scheduled on: March 01, 2017 [facility]) Report to Same Day Surgery 2nd floor medical mall ([place] [facility]-take elevator on left to 2nd floor.  Check in with surgery information desk.) To find out your arrival time please call [phone] between 1PM - 3PM on February 26, 2017 (FRIDAY) Remember: Instructions that are not followed completely may result in serious medical risk, up to and including death, or upon the discretion of your surgeon and anesthesiologist your surgery may need to be rescheduled.    _x___ 1. Do not eat food or drink liquids after midnight. No gum chewing or hard candies                                __x__ 2. No Alcohol for 24 hours before or after surgery.   __x__3. No Smoking for 24 prior to surgery.   ____  4. Bring all medications with you on the day of surgery if instructed.    __x__ 5. Notify your doctor if there is any change in your medical condition     (cold, fever, infections).     Do not wear jewelry, make-up, hairpins, clips or nail polish.  Do not wear lotions, powders, or perfumes.   Do not shave 48 hours prior to surgery. Men may shave face and neck.  Do not bring valuables to the hospital.    [facility] is not responsible for any belongings or valuables.               Contacts, dentures or bridgework may not be worn into surgery.  Leave your suitcase in the car. After surgery it may be brought to your room.  For patients admitted to the hospital, discharge time is determined by your treatment team                         Patients discharged the day of surgery will not be allowed to drive home.  You will need someone to drive you home and stay with you the night of your procedure.    Please read over the following fact sheets that you were given:   [facility] Preparing for Surgery and or MRSA Information   _x___  PATIENT WILL TAKE ALL MEDICATIONS WHEN SHE ARRIVES HOME ON MONDAY   AS USUAL       ____Fleets enema or  Magnesium Citrate as directed.   ___ Use CHG Soap or sage wipes as directed on instruction sheet   ____ Use inhalers on the day of surgery and bring to hospital day of surgery  ____ Stop Metformin and Janumet 2 days prior to surgery.    ____ Take 1/2 of usual insulin dose the night before surgery and none on the morning surgery      _x___ Follow recommendations from Cardiologist, Pulmonologist or PCP regarding          stopping Aspirin, Coumadin, Pllavix ,Eliquis, Effient, or Pradaxa, and Pletal.  X____Stop Anti-inflammatories such as Advil, Aleve, Ibuprofen, Motrin, Naproxen, Naprosyn, Goodies powders or aspirin products. OK to take Tylenol    _x___ Stop supplements until after surgery.  But may continue Vitamin D, Vitamin B, and multivitamin      ____ Bring C-Pap to the hospital.

## 2017-02-25 NOTE — Pre-Procedure Instructions (Signed)
Component Name Value Ref Range  EKG Ventricular Rate 79 BPM   EKG Atrial Rate 79 BPM   EKG P-R Interval 138 ms  EKG QRS Duration 90 ms  EKG Q-T Interval 400 ms  EKG QTC Calculation 458 ms  EKG Calculated P Axis 11 degrees   EKG Calculated R Axis 36 degrees   EKG Calculated T Axis 65 degrees   Result Narrative  NORMAL SINUS RHYTHM NORMAL ECG NO PREVIOUS ECGS AVAILABLE Confirmed by Dimas [place] [phone]) on 02/18/2017 11:09:02 PM  Status Results Details    Hospital Encounter on 02/18/2017 [place]")' [email or web address]/">Encounter Summary

## 2017-02-26 LAB — URINE CULTURE: Culture: NO GROWTH

## 2017-03-01 ENCOUNTER — Ambulatory Visit: Payer: BLUE CROSS/BLUE SHIELD | Admitting: Anesthesiology

## 2017-03-01 ENCOUNTER — Encounter
Admission: RE | Disposition: A | Discharge status: Home / Self Care | Payer: Self-pay | Source: Ambulatory Visit | Attending: Urology

## 2017-03-01 ENCOUNTER — Ambulatory Visit
Admission: RE | Admit: 2017-03-01 | Discharge: 2017-03-01 | Disposition: A | Discharge status: Home / Self Care | Payer: BLUE CROSS/BLUE SHIELD | Source: Ambulatory Visit | Attending: Urology | Admitting: Urology

## 2017-03-01 DIAGNOSIS — M1990 Unspecified osteoarthritis, unspecified site: Secondary | ICD-10-CM | POA: Diagnosis not present

## 2017-03-01 DIAGNOSIS — Z880 Allergy status to penicillin: Secondary | ICD-10-CM | POA: Diagnosis not present

## 2017-03-01 DIAGNOSIS — K5909 Other constipation: Secondary | ICD-10-CM | POA: Insufficient documentation

## 2017-03-01 DIAGNOSIS — Z79899 Other long term (current) drug therapy: Secondary | ICD-10-CM | POA: Diagnosis not present

## 2017-03-01 DIAGNOSIS — Z8719 Personal history of other diseases of the digestive system: Secondary | ICD-10-CM | POA: Insufficient documentation

## 2017-03-01 DIAGNOSIS — Z833 Family history of diabetes mellitus: Secondary | ICD-10-CM | POA: Diagnosis not present

## 2017-03-01 DIAGNOSIS — Z888 Allergy status to other drugs, medicaments and biological substances status: Secondary | ICD-10-CM | POA: Insufficient documentation

## 2017-03-01 DIAGNOSIS — Z885 Allergy status to narcotic agent status: Secondary | ICD-10-CM | POA: Insufficient documentation

## 2017-03-01 DIAGNOSIS — E785 Hyperlipidemia, unspecified: Secondary | ICD-10-CM | POA: Diagnosis not present

## 2017-03-01 DIAGNOSIS — D494 Neoplasm of unspecified behavior of bladder: Secondary | ICD-10-CM | POA: Diagnosis not present

## 2017-03-01 DIAGNOSIS — Z881 Allergy status to other antibiotic agents status: Secondary | ICD-10-CM | POA: Diagnosis not present

## 2017-03-01 DIAGNOSIS — M810 Age-related osteoporosis without current pathological fracture: Secondary | ICD-10-CM | POA: Diagnosis not present

## 2017-03-01 DIAGNOSIS — C672 Malignant neoplasm of lateral wall of bladder: Secondary | ICD-10-CM | POA: Diagnosis not present

## 2017-03-01 DIAGNOSIS — N3289 Other specified disorders of bladder: Secondary | ICD-10-CM

## 2017-03-01 DIAGNOSIS — Z8249 Family history of ischemic heart disease and other diseases of the circulatory system: Secondary | ICD-10-CM | POA: Insufficient documentation

## 2017-03-01 DIAGNOSIS — R351 Nocturia: Secondary | ICD-10-CM | POA: Insufficient documentation

## 2017-03-01 DIAGNOSIS — C679 Malignant neoplasm of bladder, unspecified: Secondary | ICD-10-CM | POA: Diagnosis not present

## 2017-03-01 DIAGNOSIS — I10 Essential (primary) hypertension: Secondary | ICD-10-CM | POA: Insufficient documentation

## 2017-03-01 DIAGNOSIS — Z8042 Family history of malignant neoplasm of prostate: Secondary | ICD-10-CM | POA: Diagnosis not present

## 2017-03-01 DIAGNOSIS — Z90710 Acquired absence of both cervix and uterus: Secondary | ICD-10-CM | POA: Diagnosis not present

## 2017-03-01 DIAGNOSIS — Z882 Allergy status to sulfonamides status: Secondary | ICD-10-CM | POA: Diagnosis not present

## 2017-03-01 HISTORY — PX: CYSTOSCOPY W/ RETROGRADES: SHX1426

## 2017-03-01 HISTORY — PX: TRANSURETHRAL RESECTION OF BLADDER TUMOR WITH MITOMYCIN-C: SHX6459

## 2017-03-01 LAB — CULTURE, URINE COMPREHENSIVE

## 2017-03-01 SURGERY — TRANSURETHRAL RESECTION OF BLADDER TUMOR WITH MITOMYCIN-C
Anesthesia: General | Site: Urethra | Wound class: Clean

## 2017-03-01 MED ORDER — MITOMYCIN CHEMO FOR BLADDER INSTILLATION 40 MG
INTRAVENOUS | Status: DC | PRN
  Administered 2017-03-01: 40 mg via INTRAVESICAL

## 2017-03-01 MED ORDER — ONDANSETRON HCL 4 MG/2ML IJ SOLN
INTRAMUSCULAR | Status: DC | PRN
  Administered 2017-03-01: 4 mg via INTRAVENOUS

## 2017-03-01 MED ORDER — GLYCOPYRROLATE 0.2 MG/ML IJ SOLN
INTRAMUSCULAR | Status: DC | PRN
  Administered 2017-03-01: 0.2 mg via INTRAVENOUS

## 2017-03-01 MED ORDER — LIDOCAINE HCL (PF) 2 % IJ SOLN
INTRAMUSCULAR | Status: AC
  Filled 2017-03-01: qty 2

## 2017-03-01 MED ORDER — FAMOTIDINE 20 MG PO TABS
20.0000 mg | ORAL_TABLET | Freq: Once | ORAL | Status: AC
  Administered 2017-03-01: 20 mg via ORAL

## 2017-03-01 MED ORDER — ONDANSETRON HCL 4 MG PO TABS: 4.0000 mg | ORAL_TABLET | Freq: Three times a day (TID) | ORAL | 0 refills | Status: DC | PRN

## 2017-03-01 MED ORDER — ACETAMINOPHEN 10 MG/ML IV SOLN
INTRAVENOUS | Status: AC
  Filled 2017-03-01: qty 100

## 2017-03-01 MED ORDER — DOCUSATE SODIUM 100 MG PO CAPS: 100.0000 mg | ORAL_CAPSULE | Freq: Two times a day (BID) | ORAL | 0 refills | Status: DC

## 2017-03-01 MED ORDER — MITOMYCIN CHEMO FOR BLADDER INSTILLATION 40 MG
40.0000 mg | Freq: Once | INTRAVENOUS | Status: DC
  Filled 2017-03-01: qty 40

## 2017-03-01 MED ORDER — FAMOTIDINE 20 MG PO TABS
ORAL_TABLET | ORAL | Status: AC
  Administered 2017-03-01: 20 mg via ORAL
  Filled 2017-03-01: qty 1

## 2017-03-01 MED ORDER — DEXAMETHASONE SODIUM PHOSPHATE 10 MG/ML IJ SOLN
INTRAMUSCULAR | Status: DC | PRN
  Administered 2017-03-01: 10 mg via INTRAVENOUS

## 2017-03-01 MED ORDER — PHENYLEPHRINE HCL 10 MG/ML IJ SOLN
INTRAMUSCULAR | Status: DC | PRN
  Administered 2017-03-01: 50 ug via INTRAVENOUS

## 2017-03-01 MED ORDER — HYDROCODONE-ACETAMINOPHEN 5-325 MG PO TABS: 1.0000 | ORAL_TABLET | Freq: Four times a day (QID) | ORAL | 0 refills | Status: DC | PRN

## 2017-03-01 MED ORDER — CEFAZOLIN SODIUM-DEXTROSE 2-4 GM/100ML-% IV SOLN
INTRAVENOUS | Status: AC
  Filled 2017-03-01: qty 100

## 2017-03-01 MED ORDER — LIDOCAINE HCL (CARDIAC) 20 MG/ML IV SOLN
INTRAVENOUS | Status: DC | PRN
  Administered 2017-03-01: 80 mg via INTRAVENOUS

## 2017-03-01 MED ORDER — OXYBUTYNIN CHLORIDE 5 MG PO TABS: 5.0000 mg | ORAL_TABLET | Freq: Three times a day (TID) | ORAL | 0 refills | Status: DC | PRN

## 2017-03-01 MED ORDER — ACETAMINOPHEN 10 MG/ML IV SOLN
INTRAVENOUS | Status: DC | PRN
  Administered 2017-03-01: 1000 mg via INTRAVENOUS

## 2017-03-01 MED ORDER — PROPOFOL 10 MG/ML IV BOLUS
INTRAVENOUS | Status: DC | PRN
  Administered 2017-03-01: 20 mg via INTRAVENOUS
  Administered 2017-03-01: 180 mg via INTRAVENOUS

## 2017-03-01 MED ORDER — OXYBUTYNIN CHLORIDE 5 MG PO TABS
ORAL_TABLET | ORAL | Status: AC
  Filled 2017-03-01: qty 1

## 2017-03-01 MED ORDER — DEXAMETHASONE SODIUM PHOSPHATE 10 MG/ML IJ SOLN
INTRAMUSCULAR | Status: AC
  Filled 2017-03-01: qty 1

## 2017-03-01 MED ORDER — FENTANYL CITRATE (PF) 100 MCG/2ML IJ SOLN
INTRAMUSCULAR | Status: DC | PRN
  Administered 2017-03-01 (×4): 25 ug via INTRAVENOUS

## 2017-03-01 MED ORDER — PROPOFOL 10 MG/ML IV BOLUS
INTRAVENOUS | Status: AC
  Filled 2017-03-01: qty 20

## 2017-03-01 MED ORDER — FENTANYL CITRATE (PF) 100 MCG/2ML IJ SOLN
INTRAMUSCULAR | Status: AC
  Filled 2017-03-01: qty 2

## 2017-03-01 MED ORDER — ONDANSETRON HCL 4 MG/2ML IJ SOLN
INTRAMUSCULAR | Status: AC
  Filled 2017-03-01: qty 2

## 2017-03-01 MED ORDER — ONDANSETRON HCL 4 MG/2ML IJ SOLN: 4.0000 mg | Freq: Once | INTRAMUSCULAR | Status: DC | PRN

## 2017-03-01 MED ORDER — LACTATED RINGERS IV SOLN
INTRAVENOUS | Status: DC
  Administered 2017-03-01: 07:00:00 via INTRAVENOUS

## 2017-03-01 MED ORDER — MIDAZOLAM HCL 2 MG/2ML IJ SOLN
INTRAMUSCULAR | Status: AC
  Filled 2017-03-01: qty 2

## 2017-03-01 MED ORDER — MIDAZOLAM HCL 2 MG/2ML IJ SOLN
INTRAMUSCULAR | Status: DC | PRN
  Administered 2017-03-01: 2 mg via INTRAVENOUS

## 2017-03-01 MED ORDER — CEFAZOLIN SODIUM-DEXTROSE 2-4 GM/100ML-% IV SOLN
2.0000 g | INTRAVENOUS | Status: AC
  Administered 2017-03-01: 2 g via INTRAVENOUS

## 2017-03-01 MED ORDER — FENTANYL CITRATE (PF) 100 MCG/2ML IJ SOLN: 25.0000 ug | INTRAMUSCULAR | Status: DC | PRN

## 2017-03-01 MED ORDER — OXYBUTYNIN CHLORIDE 5 MG PO TABS
5.0000 mg | ORAL_TABLET | Freq: Three times a day (TID) | ORAL | Status: DC | PRN
  Administered 2017-03-01: 5 mg via ORAL
  Filled 2017-03-01 (×2): qty 1

## 2017-03-01 SURGICAL SUPPLY — 31 items
BAG DRAIN CYSTO-URO LG1000N (MISCELLANEOUS) ×3 IMPLANT
BAG URO DRAIN 2000ML W/SPOUT (MISCELLANEOUS) ×3 IMPLANT
CATH FOLEY 2WAY  5CC 16FR (CATHETERS) ×1
CATH URETL 5X70 OPEN END (CATHETERS) ×3 IMPLANT
CATH URTH 16FR FL 2W BLN LF (CATHETERS) ×2 IMPLANT
CONRAY 43 FOR UROLOGY 50M (MISCELLANEOUS) ×3 IMPLANT
DRAPE UTILITY 15X26 TOWEL STRL (DRAPES) ×3 IMPLANT
DRSG TELFA 4X3 1S NADH ST (GAUZE/BANDAGES/DRESSINGS) ×3 IMPLANT
ELECT LOOP 22F BIPOLAR SML (ELECTROSURGICAL) ×3
ELECT REM PT RETURN 9FT ADLT (ELECTROSURGICAL)
ELECTRODE LOOP 22F BIPOLAR SML (ELECTROSURGICAL) ×2 IMPLANT
ELECTRODE REM PT RTRN 9FT ADLT (ELECTROSURGICAL) IMPLANT
GLOVE BIO SURGEON STRL SZ 6.5 (GLOVE) ×3 IMPLANT
GOWN STRL REUS W/ TWL LRG LVL3 (GOWN DISPOSABLE) ×4 IMPLANT
GOWN STRL REUS W/TWL LRG LVL3 (GOWN DISPOSABLE) ×2
KIT RM TURNOVER CYSTO AR (KITS) ×3 IMPLANT
LOOP CUT BIPOLAR 24F LRG (ELECTROSURGICAL) IMPLANT
NDL SAFETY ECLIPSE 18X1.5 (NEEDLE) ×2 IMPLANT
NEEDLE HYPO 18GX1.5 SHARP (NEEDLE) ×1
PACK CYSTO AR (MISCELLANEOUS) ×3 IMPLANT
SCRUB POVIDONE IODINE 4 OZ (MISCELLANEOUS) ×3 IMPLANT
SENSORWIRE 0.038 NOT ANGLED (WIRE) ×3
SET CYSTO W/LG BORE CLAMP LF (SET/KITS/TRAYS/PACK) ×3 IMPLANT
SET IRRIG Y TYPE TUR BLADDER L (SET/KITS/TRAYS/PACK) ×3 IMPLANT
SET IRRIGATING DISP (SET/KITS/TRAYS/PACK) ×3 IMPLANT
SOL .9 NS 3000ML IRR  AL (IV SOLUTION) ×1
SOL .9 NS 3000ML IRR UROMATIC (IV SOLUTION) ×2 IMPLANT
SURGILUBE 2OZ TUBE FLIPTOP (MISCELLANEOUS) ×3 IMPLANT
SYRINGE IRR TOOMEY STRL 70CC (SYRINGE) ×3 IMPLANT
WATER STERILE IRR 1000ML POUR (IV SOLUTION) ×3 IMPLANT
WIRE SENSOR 0.038 NOT ANGLED (WIRE) ×2 IMPLANT

## 2017-03-01 NOTE — H&P (View-Only) (Signed)
02/22/2017 3:42 PM   [place] 08-20-1951 [id number]  Referring provider: Leonides Sake, MD [place], [place] [address]  No chief complaint on file.   HPI: 41 to female who presents today for further evaluation of incidental bladder lesion identified on CT scan.  She initially presented to the emergency room at [facility] on 02/18/2017 with chest pain and shortness of breath. A chest x-ray was obtained which showed questionable right free air under the hemidiaphragm. As such, a CT scan of the abdomen and pelvis was obtained for further workup with this. In fact, there was no free air and this was an artifact. Incidentally, an 8 mm polypoid lesion (right posterior) was identified within the bladder. She was advised to follow up with urology.  Never smoker.  She does have a long history of working in the [employer] but no known exposure to aniline dyes.  No history of recent gross hematuria.  Remote history of hemorrhagic cystitis 30 years ago.  No recent CT scan.    She does have chronic constipation.    No significant urinary frequency or urgency.  Occasional nocturia related to oral intake before bed.    Her workup for chest pain is otherwise negative.  This included negative EKG, troponins, etc.  PMH: Past Medical History:  Diagnosis Date  . Arthritis   . Diverticulitis   . Diverticulosis   . Hyperlipidemia   . Hypertension   . Osteoporosis     Surgical History: Past Surgical History:  Procedure Laterality Date  . ABDOMINAL HYSTERECTOMY    . APPENDECTOMY    . CHOLECYSTECTOMY    . ELBOW FRACTURE SURGERY    . KNEE SURGERY    . OTHER SURGICAL HISTORY     had to be on percocet for a year and it slowed down her "gut"     Home Medications:  Allergies as of 02/22/2017      Reactions   Ampicillin Nausea And Vomiting   Cephalexin Nausea And Vomiting   Ciprofloxacin Dermatitis   Pt states felt like "burning under her skin all  over"   Codeine Nausea And Vomiting   Flagyl [metronidazole] Dermatitis   States previously had n/v to this med then had a rxt with "burning skin all over"   Hydrochlorothiazide    Oxycodone-acetaminophen Nausea And Vomiting   Sulfa Antibiotics Nausea And Vomiting   Vicodin [hydrocodone-acetaminophen] Nausea And Vomiting      Medication List       Accurate as of 02/22/17  3:42 PM. Always use your most recent med list.          acetaminophen 500 MG tablet Commonly known as:  TYLENOL Take 500 mg by mouth every 6 (six) hours as needed for mild pain.   amoxicillin-clavulanate 875-125 MG tablet Commonly known as:  AUGMENTIN Take 1 tablet by mouth 2 (two) times daily.   losartan 100 MG tablet Commonly known as:  COZAAR Take 1 tablet by mouth daily.   multivitamin with minerals Tabs tablet Take 1 tablet by mouth daily.   polyethylene glycol packet Commonly known as:  MIRALAX / GLYCOLAX Take 17 g by mouth 2 (two) times daily. Takes at 3 pm and 7 pm   simvastatin 20 MG tablet Commonly known as:  ZOCOR Take 1 tablet by mouth daily.   TOPROL XL 50 MG 24 hr tablet Generic drug:  metoprolol succinate Take 1 tablet by mouth daily.   zolpidem 10 MG tablet Commonly  known as:  AMBIEN Take 10 mg by mouth at bedtime as needed for sleep.       Allergies:  Allergies  Allergen Reactions  . Ampicillin Nausea And Vomiting  . Cephalexin Nausea And Vomiting  . Ciprofloxacin Dermatitis    Pt states felt like "burning under her skin all over"  . Codeine Nausea And Vomiting  . Flagyl [Metronidazole] Dermatitis    States previously had n/v to this med then had a rxt with "burning skin all over"  . Hydrochlorothiazide   . Oxycodone-Acetaminophen Nausea And Vomiting  . Sulfa Antibiotics Nausea And Vomiting  . Vicodin [Hydrocodone-Acetaminophen] Nausea And Vomiting    Family History: Family History  Problem Relation Age of Onset  . Heart disease Father        heart attack  .  Diabetes Father   . Cancer Paternal Grandfather        prostate    Social History:  reports that she has never smoked. She has never used smokeless tobacco. She reports that she does not drink alcohol or use drugs.  ROS: UROLOGY Frequent Urination?: No Hard to postpone urination?: No Burning/pain with urination?: No Get up at night to urinate?: Yes Leakage of urine?: No Urine stream starts and stops?: No Trouble starting stream?: No Do you have to strain to urinate?: No Blood in urine?: No Urinary tract infection?: No Sexually transmitted disease?: No Injury to kidneys or bladder?: No Painful intercourse?: No Weak stream?: No Currently pregnant?: No Vaginal bleeding?: No Last menstrual period?: n  Gastrointestinal Nausea?: No Vomiting?: No Indigestion/heartburn?: No Diarrhea?: No Constipation?: No  Constitutional Fever: No Night sweats?: No Weight loss?: No Fatigue?: No  Skin Skin rash/lesions?: No Itching?: No  Eyes Blurred vision?: Yes Double vision?: No  Ears/Nose/Throat Sore throat?: No Sinus problems?: No  Hematologic/Lymphatic Swollen glands?: No Easy bruising?: No  Cardiovascular Leg swelling?: No Chest pain?: Yes  Respiratory Cough?: No Shortness of breath?: No  Endocrine Excessive thirst?: No  Musculoskeletal Back pain?: Yes Joint pain?: Yes  Neurological Headaches?: No Dizziness?: No  Psychologic Depression?: No Anxiety?: No  Physical Exam: BP (!) 143/88 (BP Location: Left Arm, Patient Position: Sitting, Cuff Size: Large)   Pulse 79   Ht 5\' 6"  (1.676 m)   Wt 178 lb (80.7 kg)   BMI 28.73 kg/m   Constitutional:  Alert and oriented, No acute distress. HEENT:  AT, moist mucus membranes.  Trachea midline, no masses. Cardiovascular: No clubbing, cyanosis, or edema. Respiratory: Normal respiratory effort, no increased work of breathing. GI: Abdomen is soft, nontender, nondistended, no abdominal masses GU: No CVA  tenderness.  Skin: No rashes, bruises or suspicious lesions. Lymph: No cervical or inguinal adenopathy. Neurologic: Grossly intact, no focal deficits, moving all 4 extremities. Psychiatric: Normal mood and affect.  Laboratory Data: Creatinine 0.75 Hemoglobin 13.9, hematocrit 41  Above labs drawn at [facility] on 02/18/2017  Urinalysis UA 02/18/2017 with rare bacteria, otherwise completely negative including no red blood cells.  Pertinent Imaging: 1. No intraperitoneal free air. The gas seen under the right hemidiaphragm on the chest x-ray earlier today is secondary to position of the hepatic flexure anterior to the liver up under the anterior aspect of the right hemi diaphragmatic dome. 2. 7-8 mm polypoid lesion posterior right bladder wall, highly suspicious for urothelial neoplasm. 3. Abdominal aortic atherosclerosis. I personally discussed the results of this study with Dr. Kennis Carina in the emergency department at approximately 1330 hours on 02/18/2017.   Electronically Signed By: Tamera Reason M.D. On:  02/18/2017 13:33  Result Narrative  CLINICAL DATA:Question intraperitoneal free air on chest x-ray.  EXAM: CT ABDOMEN AND PELVIS WITH CONTRAST  TECHNIQUE: Multidetector CT imaging of the abdomen and pelvis was performed using the standard protocol following bolus administration of intravenous contrast.  CONTRAST:100 cc Omnipaque 300  COMPARISON:None.  FINDINGS: Lower chest:Unremarkable.  Hepatobiliary: No focal abnormality within the liver parenchyma. Gallbladder surgically absent. No intrahepatic or extrahepatic biliary dilation.  Pancreas: No focal mass lesion. No dilatation of the main duct. No intraparenchymal cyst. No peripancreatic edema.  Spleen:No splenomegaly. No focal mass lesion.  Adrenals/Urinary Tract: No adrenal nodule or mass. Kidneys unremarkable. No evidence for hydroureter. 7 mm polypoid bladder wall lesion seen along the posterior  right bladder wall, just cranial to the UVJ (well demonstrated on coronal image 77 of series 400 also seen on axial image 86 of series 103).  Stomach/Bowel: Tiny hiatal hernia. Stomach otherwise unremarkable. Duodenum is normally positioned as is the ligament of Treitz. No small bowel wall thickening. No small bowel dilatation. The terminal ileum is normal. The appendix is not visualized, but there is no edema or inflammation in the region of the cecum. Diverticular changes are noted in the left colon without evidence of diverticulitis.  Vascular/Lymphatic: There is abdominal aortic atherosclerosis without aneurysm. There is no gastrohepatic or hepatoduodenal ligament lymphadenopathy. No intraperitoneal or retroperitoneal lymphadenopathy. No pelvic sidewall lymphadenopathy.  Reproductive: Uterus surgically absent.There is no adnexal mass.  Other: No intraperitoneal free fluid.  Musculoskeletal: Bone windows reveal no worrisome lytic or sclerotic osseous lesions.   Patient was able to bring the disc with her today from [facility] which was personally reviewed. I do see the lesion in question which is on the right posterior bladder wall highly concerning for carcinoma. I have offered her cystoscopy today to expedite workup.  Cystoscopy today confirms presence of approximately 1 cm papillary bladder tumor most consistent with bladder is normal.  Assessment & Plan:    1. Bladder mass 1 cm right posterior ladder wall mass most consistent with urothelial carcinoma  Recommend proceeding to the operating room for cystoscopy, TURBT, bilateral retrograde pyelograms, instillation of intravesical mitomycin. Risks and benefits of the procedure were reviewed today in detail including risk of bleeding, infection, damage to surrounding structures, bladder irritation.  All questions were answered.   - doxycycline (VIBRA-TABS) tablet 100 mg; Take 1 tablet (100 mg total) by mouth once. - lidocaine  (XYLOCAINE) 2 % jelly 1 application; Place 1 application into the urethra once.   Hollice Espy, MD  [facility] [address] [place], [place] [address] [phone]

## 2017-03-01 NOTE — Anesthesia Post-op Follow-up Note (Cosign Needed)
Anesthesia QCDR form completed.        

## 2017-03-01 NOTE — Op Note (Signed)
Date of procedure: 03/01/17  Preoperative diagnosis:  1. Right lateral wall bladder tumor   Postoperative diagnosis:  1. Same as above   Procedure: 1. Cystoscopy 2. TURBT, small 3. Random bladder biopsies 4. Instillation of intravesical mitomycin  Surgeon: Hollice Espy, MD  Anesthesia: General  Complications: None  Intraoperative findings: Approximately 1 cm papillary superficial appearing tumor on right lateral bladder wall. Nonspecific erythema throughout bladder, status post random bladder biopsies. No other pathology identified. Trigone unremarkable.  EBL: Minimal  Specimens: Right bladder wall bladder tumor, random bladder biopsies   Drains: 16 French Foley catheter  Indication: Gloria Bell is a 66 y.o. patient with incidental bladder tumor seen on CT scan.  After reviewing the management options for treatment, she elected to proceed with the above surgical procedure(s). We have discussed the potential benefits and risks of the procedure, side effects of the proposed treatment, the likelihood of the patient achieving the goals of the procedure, and any potential problems that might occur during the procedure or recuperation. Informed consent has been obtained.  Description of procedure:  The patient was taken to the operating room and general anesthesia was induced.  The patient was placed in the dorsal lithotomy position, prepped and draped in the usual sterile fashion, and preoperative antibiotics were administered. A preoperative time-out was performed.   A 21 French scope was advanced per urethra into the bladder. The bladder was carefully inspected and the approximately 1 cm bladder wall lesion was seen on the right lateral bladder wall, papillary in nature with a somewhat superficial appearance. This is a good distance from the right UO. Cold cup biopsies were used to enucleate this tumor and a piece wise fashion. These were passed off the field as right bladder  wall tumor. The resection was taken down to the muscularis propria which was easily visible. Once there was no visible remaining tumor, the bladder was drained. It was then carefully reinspected. There was nonspecific diffuse patchy erythema throughout the bladder but not particularly concerning for CIS. This. Most likely reactive. As precaution, cold cup biopsy forceps were used to random bladder biopsies were performed. 5 biopsies were taken including the left and right bladder walls, posterior wall, dome, and trigone. These were passed off the field as random bladder biopsies. Bugbee electrocautery was used to fulgurate each of the biopsy sites as well as the tumor bed. Once adequate hemostasis was achieved, the bladder was drained. A 16 French Foley catheter was then inserted and the balloon was filled with 10 cc of sterile water. The patient was cleaned and dried, repositioned supine position, reversed from anesthesia, taken the PACU in stable condition  40 mg of intravesical mitomycin was instilled and allowed to dwell the PACU for a total of 1 hour. This was well-tolerated. After one hour, the Foley was unclamped, the bladder was then drained and the catheter was removed.  Plan: Patient will follow-up in 3 months for cystoscopy. I will call her with her bladder biopsy results. If further intervention is warranted, we'll bring her in for an appointment.  Hollice Espy, M.D.

## 2017-03-01 NOTE — Anesthesia Preprocedure Evaluation (Signed)
Anesthesia Evaluation  Patient identified by MRN, date of birth, ID band Patient awake    Reviewed: Allergy & Precautions, NPO status , Patient's Chart, lab work & pertinent test results  History of Anesthesia Complications (+) PONV  Airway Mallampati: III       Dental   Pulmonary neg pulmonary ROS,           Cardiovascular hypertension, Pt. on medications and Pt. on home beta blockers      Neuro/Psych negative neurological ROS     GI/Hepatic negative GI ROS, Neg liver ROS,   Endo/Other  negative endocrine ROS  Renal/GU Renal InsufficiencyRenal disease     Musculoskeletal   Abdominal   Peds  Hematology   Anesthesia Other Findings   Reproductive/Obstetrics                             Anesthesia Physical Anesthesia Plan  ASA: III  Anesthesia Plan: General   Post-op Pain Management:    Induction: Intravenous  PONV Risk Score and Plan: 4 or greater and Ondansetron, Dexamethasone, Propofol, Midazolam, Scopolamine patch - Pre-op and Treatment may vary due to age or medical condition  Airway Management Planned:   Additional Equipment:   Intra-op Plan:   Post-operative Plan:   Informed Consent: I have reviewed the patients History and Physical, chart, labs and discussed the procedure including the risks, benefits and alternatives for the proposed anesthesia with the patient or authorized representative who has indicated his/her understanding and acceptance.     Plan Discussed with:   Anesthesia Plan Comments:         Anesthesia Quick Evaluation

## 2017-03-01 NOTE — Transfer of Care (Signed)
Immediate Anesthesia Transfer of Care Note  Patient: [place]  Procedure(s) Performed: Procedure(s): TRANSURETHRAL RESECTION OF BLADDER TUMOR WITH MITOMYCIN-C (N/A) CYSTOSCOPY WITH RETROGRADE PYELOGRAM (Bilateral)  Patient Location: PACU  Anesthesia Type:General  Level of Consciousness: sedated  Airway & Oxygen Therapy: Patient Spontanous Breathing and Patient connected to face mask oxygen  Post-op Assessment: Report given to RN and Post -op Vital signs reviewed and stable  Post vital signs: Reviewed and stable  Last Vitals:  Vitals:   03/01/17 0703 03/01/17 0933  BP: (!) 142/100 (!) 154/98  Pulse: 71 80  Resp: 20 15  Temp: 36.2 C 36.2 C    Last Pain:  Vitals:   03/01/17 0703  TempSrc: Oral         Complications: No apparent anesthesia complications

## 2017-03-01 NOTE — Anesthesia Procedure Notes (Signed)
Procedure Name: LMA Insertion Performed by: Demetrius Charity Pre-anesthesia Checklist: Patient identified, Patient being monitored, Timeout performed, Emergency Drugs available and Suction available Patient Re-evaluated:Patient Re-evaluated prior to inductionOxygen Delivery Method: Circle system utilized Preoxygenation: Pre-oxygenation with 100% oxygen Intubation Type: IV induction Ventilation: Mask ventilation without difficulty LMA: LMA inserted LMA Size: 3.0 Tube type: Oral Number of attempts: 1 Placement Confirmation: positive ETCO2 and breath sounds checked- equal and bilateral Tube secured with: Tape Dental Injury: Teeth and Oropharynx as per pre-operative assessment

## 2017-03-01 NOTE — Anesthesia Postprocedure Evaluation (Signed)
Anesthesia Post Note  Patient: Gloria Bell  Procedure(s) Performed: Procedure(s) (LRB): TRANSURETHRAL RESECTION OF BLADDER TUMOR WITH MITOMYCIN-C (N/A) CYSTOSCOPY WITH RETROGRADE PYELOGRAM (Bilateral)  Patient location during evaluation: PACU Anesthesia Type: General Level of consciousness: awake and alert Pain management: pain level controlled Vital Signs Assessment: post-procedure vital signs reviewed and stable Respiratory status: spontaneous breathing and respiratory function stable Cardiovascular status: stable Anesthetic complications: no     Last Vitals:  Vitals:   03/01/17 1015 03/01/17 1020  BP: 135/82   Pulse: 76 68  Resp: 14 15  Temp:      Last Pain:  Vitals:   03/01/17 0703  TempSrc: Oral                 KEPHART,WILLIAM K

## 2017-03-01 NOTE — Discharge Instructions (Signed)
Transurethral Resection of Bladder Tumor (TURBT) or Bladder Biopsy ° ° °Definition: ° Transurethral Resection of the Bladder Tumor is a surgical procedure used to diagnose and remove tumors within the bladder. TURBT is the most common treatment for early stage bladder cancer. ° °General instructions: °   ° Your recent bladder surgery requires very little post hospital care but some definite precautions. ° °Despite the fact that no skin incisions were used, the area around the bladder incisions are raw and covered with scabs to promote healing and prevent bleeding. Certain precautions are needed to insure that the scabs are not disturbed over the next 2-4 weeks while the healing proceeds. ° °Because the raw surface inside your bladder and the irritating effects of urine you may expect frequency of urination and/or urgency (a stronger desire to urinate) and perhaps even getting up at night more often. This will usually resolve or improve slowly over the healing period. You may see some blood in your urine over the first 6 weeks. Do not be alarmed, even if the urine was clear for a while. Get off your feet and drink lots of fluids until clearing occurs. If you start to pass clots or don't improve call us. ° °Diet: ° °You may return to your normal diet immediately. Because of the raw surface of your bladder, alcohol, spicy foods, foods high in acid and drinks with caffeine may cause irritation or frequency and should be used in moderation. To keep your urine flowing freely and avoid constipation, drink plenty of fluids during the day (8-10 glasses). Tip: Avoid cranberry juice because it is very acidic. ° °Activity: ° °Your physical activity doesn't need to be restricted. However, if you are very active, you may see some blood in the urine. We suggest that you reduce your activity under the circumstances until the bleeding has stopped. ° °Bowels: ° °It is important to keep your bowels regular during the postoperative  period. Straining with bowel movements can cause bleeding. A bowel movement every other day is reasonable. Use a mild laxative if needed, such as milk of magnesia 2-3 tablespoons, or 2 Dulcolax tablets. Call if you continue to have problems. If you had been taking narcotics for pain, before, during or after your surgery, you may be constipated. Take a laxative if necessary. ° ° ° °Medication: ° °You should resume your pre-surgery medications unless told not to. In addition you may be given an antibiotic to prevent or treat infection. Antibiotics are not always necessary. All medication should be taken as prescribed until the bottles are finished unless you are having an unusual reaction to one of the drugs. ° ° °[facility] °[address], [address] °[place], [place] [address] °[phone] ° ° ° ° °

## 2017-03-01 NOTE — Interval H&P Note (Signed)
History and Physical Interval Note:  03/01/2017 8:25 AM  Gloria Bell  has presented today for surgery, with the diagnosis of BLADDER MASS  The various methods of treatment have been discussed with the patient and family. After consideration of risks, benefits and other options for treatment, the patient has consented to  Procedure(s): TRANSURETHRAL RESECTION OF BLADDER TUMOR WITH MITOMYCIN-C (N/A) CYSTOSCOPY WITH RETROGRADE PYELOGRAM (Bilateral) as a surgical intervention .  The patient's history has been reviewed, patient examined, no change in status, stable for surgery.  I have reviewed the patient's chart and labs.  Questions were answered to the patient's satisfaction.    RRR CTAB  Hollice Espy

## 2017-03-02 ENCOUNTER — Encounter: Payer: Self-pay | Admitting: Urology

## 2017-03-02 ENCOUNTER — Telehealth: Payer: Self-pay | Admitting: Urology

## 2017-03-02 LAB — SURGICAL PATHOLOGY

## 2017-03-02 NOTE — Telephone Encounter (Signed)
Called patient to discuss surgical pathology. 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 As such, no further intervention at this time. We'll plan on cystoscopy in the office in 3 months as the previous lead discussed. All questions were answered.  Hollice Espy, MD

## 2017-03-04 ENCOUNTER — Telehealth: Payer: Self-pay | Admitting: Urology

## 2017-03-04 NOTE — Telephone Encounter (Signed)
Spoke with patient and she states that she is having pelvic discomfort. Patient was instructed to utilized the oxybutinin and tylenol and/or mortrin and if those medications do not keep her pain under control to call back and we will re- evaluate. Patient verbalized understanding and is in agreement with this plan/SW

## 2017-03-04 NOTE — Telephone Encounter (Signed)
Patient is calling asking for more pain meds. She said she is still in a lot of pain and doesn't want to be out of them over the weekend. Please have the nurse call her back.  Gloria Bell

## 2017-03-04 NOTE — Telephone Encounter (Signed)
Please call this patient and find out what sort of pain she is having. Make sure she is taking her bladder spasm medication as this will be most helpful. She should also take Tylenol and Motrin. I'm not comfortable prescribing her more pain medicine at this time unless she is optimized on other medications.  Hollice Espy, MD

## 2017-04-05 DIAGNOSIS — E663 Overweight: Secondary | ICD-10-CM | POA: Diagnosis not present

## 2017-04-05 DIAGNOSIS — Z6829 Body mass index (BMI) 29.0-29.9, adult: Secondary | ICD-10-CM | POA: Diagnosis not present

## 2017-04-05 DIAGNOSIS — I10 Essential (primary) hypertension: Secondary | ICD-10-CM | POA: Diagnosis not present

## 2017-04-05 DIAGNOSIS — Z1231 Encounter for screening mammogram for malignant neoplasm of breast: Secondary | ICD-10-CM | POA: Diagnosis not present

## 2017-04-21 DIAGNOSIS — Z1231 Encounter for screening mammogram for malignant neoplasm of breast: Secondary | ICD-10-CM | POA: Diagnosis not present

## 2017-05-04 ENCOUNTER — Encounter: Payer: Self-pay | Admitting: *Deleted

## 2017-05-04 ENCOUNTER — Emergency Department
Admission: EM | Admit: 2017-05-04 | Discharge: 2017-05-05 | Disposition: A | Discharge status: Home / Self Care | Payer: BLUE CROSS/BLUE SHIELD | Attending: Emergency Medicine | Admitting: Emergency Medicine

## 2017-05-04 ENCOUNTER — Emergency Department: Payer: BLUE CROSS/BLUE SHIELD

## 2017-05-04 DIAGNOSIS — E785 Hyperlipidemia, unspecified: Secondary | ICD-10-CM | POA: Diagnosis present

## 2017-05-04 DIAGNOSIS — K625 Hemorrhage of anus and rectum: Secondary | ICD-10-CM | POA: Diagnosis not present

## 2017-05-04 DIAGNOSIS — N321 Vesicointestinal fistula: Secondary | ICD-10-CM | POA: Diagnosis not present

## 2017-05-04 DIAGNOSIS — R109 Unspecified abdominal pain: Secondary | ICD-10-CM | POA: Diagnosis not present

## 2017-05-04 DIAGNOSIS — N189 Chronic kidney disease, unspecified: Secondary | ICD-10-CM | POA: Diagnosis not present

## 2017-05-04 DIAGNOSIS — I129 Hypertensive chronic kidney disease with stage 1 through stage 4 chronic kidney disease, or unspecified chronic kidney disease: Secondary | ICD-10-CM | POA: Insufficient documentation

## 2017-05-04 DIAGNOSIS — R1084 Generalized abdominal pain: Secondary | ICD-10-CM | POA: Insufficient documentation

## 2017-05-04 DIAGNOSIS — I10 Essential (primary) hypertension: Secondary | ICD-10-CM | POA: Diagnosis present

## 2017-05-04 DIAGNOSIS — C678 Malignant neoplasm of overlapping sites of bladder: Secondary | ICD-10-CM | POA: Diagnosis not present

## 2017-05-04 DIAGNOSIS — N3090 Cystitis, unspecified without hematuria: Secondary | ICD-10-CM | POA: Diagnosis not present

## 2017-05-04 DIAGNOSIS — Z79899 Other long term (current) drug therapy: Secondary | ICD-10-CM | POA: Diagnosis not present

## 2017-05-04 DIAGNOSIS — R1032 Left lower quadrant pain: Secondary | ICD-10-CM | POA: Diagnosis not present

## 2017-05-04 LAB — URINALYSIS, COMPLETE (UACMP) WITH MICROSCOPIC
BILIRUBIN URINE: NEGATIVE
Bacteria, UA: NONE SEEN
GLUCOSE, UA: NEGATIVE mg/dL
Hgb urine dipstick: NEGATIVE
KETONES UR: NEGATIVE mg/dL
Nitrite: NEGATIVE
PROTEIN: NEGATIVE mg/dL
Specific Gravity, Urine: 1.015 (ref 1.005–1.030)
pH: 5 (ref 5.0–8.0)

## 2017-05-04 LAB — COMPREHENSIVE METABOLIC PANEL
ALBUMIN: 4.8 g/dL (ref 3.5–5.0)
ALK PHOS: 53 U/L (ref 38–126)
ALT: 19 U/L (ref 14–54)
AST: 18 U/L (ref 15–41)
Anion gap: 10 (ref 5–15)
BUN: 18 mg/dL (ref 6–20)
CALCIUM: 9.6 mg/dL (ref 8.9–10.3)
CO2: 23 mmol/L (ref 22–32)
CREATININE: 0.79 mg/dL (ref 0.44–1.00)
Chloride: 105 mmol/L (ref 101–111)
GFR calc Af Amer: >60 mL/min (ref >60)
GFR calc non Af Amer: >60 mL/min (ref >60)
GLUCOSE: 111 mg/dL — AB (ref 65–99)
Potassium: 4 mmol/L (ref 3.5–5.1)
SODIUM: 138 mmol/L (ref 135–145)
Total Bilirubin: 0.6 mg/dL (ref 0.3–1.2)
Total Protein: 7.6 g/dL (ref 6.5–8.1)

## 2017-05-04 LAB — CBC
HCT: 43.1 % (ref 35.0–47.0)
HEMOGLOBIN: 14.6 g/dL (ref 12.0–16.0)
MCH: 30.9 pg (ref 26.0–34.0)
MCHC: 33.9 g/dL (ref 32.0–36.0)
MCV: 91 fL (ref 80.0–100.0)
PLATELETS: 228 10*3/uL (ref 150–440)
RBC: 4.73 MIL/uL (ref 3.80–5.20)
RDW: 12.7 % (ref 11.5–14.5)
WBC: 6.8 10*3/uL (ref 3.6–11.0)

## 2017-05-04 LAB — LIPASE, BLOOD: Lipase: 29 U/L (ref 11–51)

## 2017-05-04 MED ORDER — IOPAMIDOL (ISOVUE-300) INJECTION 61%
100.0000 mL | Freq: Once | INTRAVENOUS | Status: AC | PRN
  Administered 2017-05-04: 100 mL via INTRAVENOUS

## 2017-05-04 MED ORDER — PIPERACILLIN-TAZOBACTAM 3.375 G IVPB 30 MIN
3.3750 g | Freq: Once | INTRAVENOUS | Status: AC
  Administered 2017-05-04: 3.375 g via INTRAVENOUS

## 2017-05-04 MED ORDER — ONDANSETRON HCL 4 MG/2ML IJ SOLN
4.0000 mg | Freq: Once | INTRAMUSCULAR | Status: AC
  Administered 2017-05-04: 4 mg via INTRAVENOUS
  Filled 2017-05-04: qty 2

## 2017-05-04 MED ORDER — KETOROLAC TROMETHAMINE 30 MG/ML IJ SOLN
15.0000 mg | Freq: Once | INTRAMUSCULAR | Status: AC
  Administered 2017-05-04: 15 mg via INTRAVENOUS
  Filled 2017-05-04: qty 1

## 2017-05-04 MED ORDER — PIPERACILLIN-TAZOBACTAM 3.375 G IVPB 30 MIN
INTRAVENOUS | Status: AC
  Filled 2017-05-04: qty 50

## 2017-05-04 NOTE — ED Triage Notes (Signed)
Pt has left lower abd pain since this morning.  Pt has diverticulitis.  Pt reports rectal bleeding.  No vomiting.  Pt alert.

## 2017-05-04 NOTE — ED Provider Notes (Signed)
[facility] Emergency Department Provider Note  ____________________________________________  Time seen: Approximately 7:48 PM  I have reviewed the triage vital signs and the nursing notes.   HISTORY  Chief Complaint Abdominal Pain and Rectal Bleeding   HPI Gloria Bell Wilfred Curtis is a 66 y.o. female with a history of diverticulosis and diverticulitis c/b intra-abdominal abscess, kidney disease, kidney stones, hypertension, hyperlipidemia, bleeding internal hemorrhoidswho presents for evaluation of abdominal pain and rectal bleeding. Patient reports that the pain started earlier today, currently moderate sharp located in the LLQ and non radiating. Associated with two episodes at 4PM of BRBPR. Patient with h/o prior GIB from hemorrhoids. Never had blood transfusion. Pain similar to prior episodes of diverticulitis. Also complaining of dysuria. No hematuria, flank pain, vomiting, diarrhea. Has had nausea today. No fever or chills.  Past Medical History:  Diagnosis Date  . Arthritis   . Chronic kidney disease    Bladder Tumor  . Diverticulitis   . Diverticulosis   . Fall from ladder 09/2003  . History of kidney stones 1987  . Hyperlipidemia   . Hypertension   . Osteoarthritis   . Osteoporosis     Patient Active Problem List   Diagnosis Date Noted  . GI bleed 05/18/2015  . Bleeding internal hemorrhoids 01/25/2013  . Abdominal pain, acute, left lower quadrant 04/18/2012  . Intra-abdominal abscess ([place]) 01/06/2012  . Diverticulitis 01/05/2012  . HTN (hypertension) 01/05/2012    Past Surgical History:  Procedure Laterality Date  . ABDOMINAL HYSTERECTOMY    . APPENDECTOMY    . CHOLECYSTECTOMY  1992  . CYSTOSCOPY W/ RETROGRADES Bilateral 03/01/2017   Procedure: CYSTOSCOPY WITH RETROGRADE PYELOGRAM;  Surgeon: Hollice Espy, MD;  Location: [facility] ORS;  Service: Urology;  Laterality: Bilateral;  . ELBOW FRACTURE SURGERY Right   . FRACTURE SURGERY    . KNEE  SURGERY Bilateral   . OTHER SURGICAL HISTORY     had to be on percocet for a year and it slowed down her "gut"   . TRANSURETHRAL RESECTION OF BLADDER TUMOR WITH MITOMYCIN-C N/A 03/01/2017   Procedure: TRANSURETHRAL RESECTION OF BLADDER TUMOR WITH MITOMYCIN-C;  Surgeon: Hollice Espy, MD;  Location: [facility] ORS;  Service: Urology;  Laterality: N/A;    Prior to Admission medications   Medication Sig Start Date End Date Taking? Authorizing Provider  acetaminophen (TYLENOL) 650 MG CR tablet Take 1,300 mg by mouth every 8 (eight) hours as needed for pain.    [provider]  amoxicillin-clavulanate (AUGMENTIN) 875-125 MG per tablet Take 1 tablet by mouth 2 (two) times daily. Patient not taking: Reported on 02/22/2017 05/20/15   Aldean Jewett, MD  docusate sodium (COLACE) 100 MG capsule Take 1 capsule (100 mg total) by mouth 2 (two) times daily. 03/01/17   Hollice Espy, MD  HYDROcodone-acetaminophen (NORCO/VICODIN) 5-325 MG tablet Take 1-2 tablets by mouth every 6 (six) hours as needed for moderate pain. 03/01/17   Hollice Espy, MD  losartan (COZAAR) 100 MG tablet Take 1 tablet by mouth daily at 3 pm.  01/29/17   [provider]  Multiple Vitamin (MULTIVITAMIN WITH MINERALS) TABS tablet Take 1 tablet by mouth daily.    [provider]  ondansetron (ZOFRAN) 4 MG tablet Take 1 tablet (4 mg total) by mouth every 8 (eight) hours as needed for nausea or vomiting. 03/01/17   Hollice Espy, MD  oxybutynin (DITROPAN) 5 MG tablet Take 1 tablet (5 mg total) by mouth every 8 (eight) hours as needed for bladder  spasms. 03/01/17   Hollice Espy, MD  polyethylene glycol [facility] / Floria Raveling) packet Take 17 g by mouth 2 (two) times daily. Takes at 3 pm and 7 pm    [provider]  simvastatin (ZOCOR) 20 MG tablet Take 1 tablet by mouth daily at 3 pm.  01/29/17   [provider]  TOPROL XL 50 MG 24 hr tablet Take 1 tablet by mouth daily at 3 pm.  02/16/17   [provider]  zolpidem (AMBIEN) 10 MG tablet Take 10 mg by mouth at bedtime as needed for sleep.    [provider]    Allergies Ampicillin; Cephalexin; Ciprofloxacin; Codeine; Flagyl [metronidazole]; Hydrochlorothiazide; Oxycodone-acetaminophen; Sulfa antibiotics; and Vicodin [hydrocodone-acetaminophen]  Family History  Problem Relation Age of Onset  . Heart disease Father        heart attack  . Diabetes Father   . Cancer Paternal Grandfather        prostate    Social History Social History  Substance Use Topics  . Smoking status: Never Smoker  . Smokeless tobacco: Never Used  . Alcohol use No    Review of Systems  Constitutional: Negative for fever. Eyes: Negative for visual changes. ENT: Negative for sore throat. Neck: No neck pain  Cardiovascular: Negative for chest pain. Respiratory: Negative for shortness of breath. Gastrointestinal: + LLQ abdominal pain and nausea. No vomiting or diarrhea. Genitourinary: + dysuria. Musculoskeletal: Negative for back pain. Skin: Negative for rash. Neurological: Negative for headaches, weakness or numbness. Psych: No SI or HI  ____________________________________________   PHYSICAL EXAM:  VITAL SIGNS: ED Triage Vitals [05/04/17 1806]  Enc Vitals Group     BP (!) 167/83     Pulse Rate 67     Resp 16     Temp 98.1 F (36.7 C)     Temp Source Oral     SpO2 97 %     Weight 175 lb (79.4 kg)     Height 5\' 6"  (1.676 m)     Head Circumference      Peak Flow      Pain Score 10     Pain Loc      Pain Edu?      Excl. in [place]?     Constitutional: Alert and oriented. Well appearing and in no apparent distress. HEENT:      Head: Normocephalic and atraumatic.         Eyes: Conjunctivae are normal. Sclera is non-icteric.       Mouth/Throat: Mucous membranes are moist.       Neck: Supple with no signs of meningismus. Cardiovascular: Regular rate and rhythm. No murmurs, gallops, or rubs. 2+ symmetrical distal pulses  are present in all extremities. No JVD. Respiratory: Normal respiratory effort. Lungs are clear to auscultation bilaterally. No wheezes, crackles, or rhonchi.  Gastrointestinal: Soft, mild ttp over the LLQ and suprapubic region, non distended with positive bowel sounds. No rebound or guarding. Genitourinary: No CVA tenderness.rectal exam showing external hemorrhoids. Stool is brown and guaiac negative Musculoskeletal: Nontender with normal range of motion in all extremities. No edema, cyanosis, or erythema of extremities. Neurologic: Normal speech and language. Face is symmetric. Moving all extremities. No gross focal neurologic deficits are appreciated. Skin: Skin is warm, dry and intact. No rash noted. Psychiatric: Mood and affect are normal. Speech and behavior are normal.  ____________________________________________   LABS (all labs ordered are listed, but only abnormal results are displayed)  Labs Reviewed  COMPREHENSIVE METABOLIC PANEL -  Abnormal; Notable for the following:       Result Value   Glucose, Bld 111 (*)    All other components within normal limits  URINALYSIS, COMPLETE (UACMP) WITH MICROSCOPIC - Abnormal; Notable for the following:    Color, Urine YELLOW (*)    APPearance CLEAR (*)    Leukocytes, UA SMALL (*)    Squamous Epithelial / LPF 0-5 (*)    All other components within normal limits  URINE CULTURE  LIPASE, BLOOD  CBC   ____________________________________________  EKG  none  ____________________________________________  RADIOLOGY  CT a/p: . Extensive sigmoid diverticulosis. There is chronic wall thickening in the sigmoid colon, likely due to chronic muscular hypertrophy from diverticulosis. Pilar Plate diverticulitis is not demonstrated on this study. A cause for rectal bleeding has not been definitively established, although bleeding from a diverticulum is quite possible. Given the history of rectal bleeding, direct visualization of colon after  appropriate bowel preparation is felt to be advisable.  2. There is wall thickening in the urinary bladder consistent with a degree of cystitis. Previously noted polypoid lesion has been removed. A tiny amount of air is noted in the wall of the urinary bladder. Etiology for this air is uncertain. Note that the sigmoid colon cannot be separated from the urinary bladder along the posterior leftward aspect. The possibility of an intravesical fistula must be of concern given this appearance. This finding is best appreciated on axial slice 79 series 2 and coronal slice 41 series 5. There appears to be only minimal loss of fat plane between the urinary bladder and the sigmoid colon. The area in the urinary bladder may be indicative of gas-forming infection. Would advise urinalysis in this regard.  3. Absent gallbladder.  4. No renal or ureteral calculus. No hydronephrosis.  5. Aortic atherosclerosis.  6. Spinal stenosis at L4-5, multifactorial.  7. Small ventral hernia containing only fat. Small hiatal hernia. ____________________________________________   PROCEDURES  Procedure(s) performed: None Procedures Critical Care performed:  None ____________________________________________   INITIAL IMPRESSION / ASSESSMENT AND PLAN / ED COURSE  66 y.o. female with a history of diverticulosis and diverticulitis c/b intra-abdominal abscess, kidney disease, kidney stones, hypertension, hyperlipidemia, bleeding internal hemorrhoidswho presents for evaluation of abdominal pain, dysuria, and rectal bleeding. Patient is extremely well appearing, no distress, hemodynamically stable. Abdominal exam is soft with mild tenderness on the left lower quadrant and suprapubic, no rebound or guarding. Rectal exam with brown stool guaiac negative and external hemorrhoids, no evidence of active GIB and stable hgb. Labs show normal white count. We'll do a CT to eval for diverticulitis.      _________________________ 8:50 PM on 05/04/2017 -----------------------------------------  CT negative for diverticulitis. Positive for possible enterovesical fistula and air within the wall of the bladder concerning for gas forming infection. UA with small leuks but otherwise normal. Zosyn ordered. Will consult Urology.  _________________________ 9:16 PM on 05/04/2017 -----------------------------------------  Discussed with Dr. Junious Silk, urology who recommended surgical eval for fistula. Urology will follow in house.  Pertinent labs & imaging results that were available during my care of the patient were reviewed by me and considered in my medical decision making (see chart for details).    ____________________________________________   FINAL CLINICAL IMPRESSION(S) / ED DIAGNOSES  Final diagnoses:  Enterovesical fistula  Cystitis      NEW MEDICATIONS STARTED DURING THIS VISIT:  New Prescriptions   No medications on file     Note:  This document was prepared using Dragon voice recognition software  and may include unintentional dictation errors.    Rudene Re, MD 05/04/17 2117

## 2017-05-04 NOTE — Consult Note (Signed)
Consult: Bladder cancer, pneumaturia/possible fistula  Chief Complaint: Abdominal pain, rectal bleeding  History of Present Illness: 66 year old female who woke up this morning with left lower quadrant pain. She said this pain is typical of her diverticulitis episodes. She also noticed bright red blood per rectum when she wiped. She denied dysuria, gross hematuria or pneumaturia. She is status post a TURBT for a small 1 cm low-grade TA tumor 03/01/2017. Dr. Erlene Quan noted the tumor on the right and scattered erythema throughout but no obvious fistula. CT scan revealed possible air anterior bladder but I thought this looks similar to her May 2018 scan and thought there were good fat planes between the colon and the bladder throughout. Pt also complained of prolapse and something she saw in the vaginal area with her mirror. She's s/p Hx.   Past Medical History:  Diagnosis Date  . Arthritis   . Chronic kidney disease    Bladder Tumor  . Diverticulitis   . Diverticulosis   . Fall from ladder 09/2003  . History of kidney stones 1987  . Hyperlipidemia   . Hypertension   . Osteoarthritis   . Osteoporosis    Past Surgical History:  Procedure Laterality Date  . ABDOMINAL HYSTERECTOMY    . APPENDECTOMY    . CHOLECYSTECTOMY  1992  . CYSTOSCOPY W/ RETROGRADES Bilateral 03/01/2017   Procedure: CYSTOSCOPY WITH RETROGRADE PYELOGRAM;  Surgeon: Hollice Espy, MD;  Location: [facility] ORS;  Service: Urology;  Laterality: Bilateral;  . ELBOW FRACTURE SURGERY Right   . FRACTURE SURGERY    . KNEE SURGERY Bilateral   . OTHER SURGICAL HISTORY     had to be on percocet for a year and it slowed down her "gut"   . TRANSURETHRAL RESECTION OF BLADDER TUMOR WITH MITOMYCIN-C N/A 03/01/2017   Procedure: TRANSURETHRAL RESECTION OF BLADDER TUMOR WITH MITOMYCIN-C;  Surgeon: Hollice Espy, MD;  Location: [facility] ORS;  Service: Urology;  Laterality: N/A;    Home Medications:   (Not in a hospital admission) Allergies:   Allergies  Allergen Reactions  . Ampicillin Nausea And Vomiting    Has patient had a PCN reaction causing immediate rash, facial/tongue/throat swelling, SOB or lightheadedness with hypotension: No Has patient had a PCN reaction causing severe rash involving mucus membranes or skin necrosis: No Has patient had a PCN reaction that required hospitalization: No Has patient had a PCN reaction occurring within the last 10 years: No If all of the above answers are "NO", then may proceed with Cephalosporin use..   . Cephalexin Nausea And Vomiting  . Ciprofloxacin Dermatitis    Pt states felt like "burning under her skin all over"  . Codeine Nausea And Vomiting  . Flagyl [Metronidazole] Dermatitis    States previously had n/v to this med then had a rxt with "burning skin all over"  . Hydrochlorothiazide Nausea And Vomiting  . Oxycodone-Acetaminophen Nausea And Vomiting  . Sulfa Antibiotics Nausea And Vomiting  . Vicodin [Hydrocodone-Acetaminophen] Nausea And Vomiting    Family History  Problem Relation Age of Onset  . Heart disease Father        heart attack  . Diabetes Father   . Cancer Paternal Grandfather        prostate   Social History:  reports that she has never smoked. She has never used smokeless tobacco. She reports that she does not drink alcohol or use drugs.  ROS: A complete review of systems was performed.  All systems are negative except for pertinent findings as noted.  Review of Systems  Gastrointestinal: Positive for abdominal pain and blood in stool.  All other systems reviewed and are negative.    Physical Exam:  Vital signs in last 24 hours: Temp:  [98.1 F (36.7 C)] 98.1 F (36.7 C) (08/14 1806) Pulse Rate:  [62-77] 66 (08/14 2300) Resp:  [16-18] 18 (08/14 2300) BP: (155-180)/(69-108) 167/85 (08/14 2300) SpO2:  [95 %-99 %] 95 % (08/14 2300) Weight:  [79.4 kg (175 lb)] 79.4 kg (175 lb) (08/14 1806) General:  Alert and oriented, No acute distress HEENT:  Normocephalic, atraumatic Neck: No JVD or lymphadenopathy Cardiovascular: Regular rate and rhythm Lungs: Regular rate and effort Abdomen: Soft, nontender, nondistended, no abdominal masses Back: No CVA tenderness Extremities: No edema Neurologic: Grossly intact  Laboratory Data:  Results for orders placed or performed during the hospital encounter of 05/04/17 (from the past 24 hour(s))  Lipase, blood     Status: None   Collection Time: 05/04/17  6:10 PM  Result Value Ref Range   Lipase 29 11 - 51 U/L  Comprehensive metabolic panel     Status: Abnormal   Collection Time: 05/04/17  6:10 PM  Result Value Ref Range   Sodium 138 135 - 145 mmol/L   Potassium 4.0 3.5 - 5.1 mmol/L   Chloride 105 101 - 111 mmol/L   CO2 23 22 - 32 mmol/L   Glucose, Bld 111 (H) 65 - 99 mg/dL   BUN 18 6 - 20 mg/dL   Creatinine, Ser 0.79 0.44 - 1.00 mg/dL   Calcium 9.6 8.9 - 10.3 mg/dL   Total Protein 7.6 6.5 - 8.1 g/dL   Albumin 4.8 3.5 - 5.0 g/dL   AST 18 15 - 41 U/L   ALT 19 14 - 54 U/L   Alkaline Phosphatase 53 38 - 126 U/L   Total Bilirubin 0.6 0.3 - 1.2 mg/dL   GFR calc non Af Amer >60 >60 mL/min   GFR calc Af Amer >60 >60 mL/min   Anion gap 10 5 - 15  CBC     Status: None   Collection Time: 05/04/17  6:10 PM  Result Value Ref Range   WBC 6.8 3.6 - 11.0 K/uL   RBC 4.73 3.80 - 5.20 MIL/uL   Hemoglobin 14.6 12.0 - 16.0 g/dL   HCT 43.1 35.0 - 47.0 %   MCV 91.0 80.0 - 100.0 fL   MCH 30.9 26.0 - 34.0 pg   MCHC 33.9 32.0 - 36.0 g/dL   RDW 12.7 11.5 - 14.5 %   Platelets 228 150 - 440 K/uL  Urinalysis, Complete w Microscopic     Status: Abnormal   Collection Time: 05/04/17  6:20 PM  Result Value Ref Range   Color, Urine YELLOW (A) YELLOW   APPearance CLEAR (A) CLEAR   Specific Gravity, Urine 1.015 1.005 - 1.030   pH 5.0 5.0 - 8.0   Glucose, UA NEGATIVE NEGATIVE mg/dL   Hgb urine dipstick NEGATIVE NEGATIVE   Bilirubin Urine NEGATIVE NEGATIVE   Ketones, ur NEGATIVE NEGATIVE mg/dL   Protein,  ur NEGATIVE NEGATIVE mg/dL   Nitrite NEGATIVE NEGATIVE   Leukocytes, UA SMALL (A) NEGATIVE   RBC / HPF 0-5 0 - 5 RBC/hpf   WBC, UA 0-5 0 - 5 WBC/hpf   Bacteria, UA NONE SEEN NONE SEEN   Squamous Epithelial / LPF 0-5 (A) NONE SEEN   Mucous PRESENT    No results found for this or any previous visit (from the past 240 hour(s)). Creatinine:  Recent Labs  05/04/17 1810  CREATININE 0.79    Impression/Assessment/plan: 1) bladder cancer-this is a low-grade superficial lesion and she needs cystoscopy in 3-6 months and then yearly. 2) possible fistula-she does have history of diverticulosis and diverticulitis but I thought the changes in the anterior bladder looks similar between her May CT scan and today's. That being said, Dr. Erlene Quan noted scattered erythema throughout the bladder some maybe it's worthwhile to repeat an exam and office cystoscopy/exam in the coming weeks. I don't think the patient needs to be admitted from a urologic point of view. I'll send a message for follow-up.  Noriah Osgood 05/04/2017, 11:15 PM

## 2017-05-04 NOTE — Discharge Instructions (Signed)
Please seek medical attention for any high fevers, chest pain, shortness of breath, change in behavior, persistent vomiting, bloody stool or any other new or concerning symptoms.  

## 2017-05-04 NOTE — ED Provider Notes (Signed)
-----------------------------------------   11:12 PM on 05/04/2017 -----------------------------------------  Dr. Junious Silk with urology as evaluated the patient. At this point he does not feel like the findings represent a true fistula. Does not think she needs to be admitted from a urological standpoint. I did discussion with the patient. She does have a history of hemorrhoids and states that she thinks that the bleeding could be coming from a hemorrhoid. However she also had some concerns for infection of her diverticula. Discussed with the patient that her CT scan did not show diverticulitis. We had a discussion and she was comfortable with checking a second hemoglobin here. She states that she has not had any further GI bleeding while here in the emergency department. A second hemoglobin stable then will plan on discharge to follow up as an outpatient.   Nance Pear, MD 05/04/17 [phone]

## 2017-05-04 NOTE — ED Notes (Signed)
Patient transported to CT 

## 2017-05-05 ENCOUNTER — Telehealth: Payer: Self-pay | Admitting: Urology

## 2017-05-05 LAB — CBC WITH DIFFERENTIAL/PLATELET
BASOS ABS: 0 10*3/uL (ref 0–0.1)
BASOS PCT: 1 %
Eosinophils Absolute: 0 10*3/uL (ref 0–0.7)
Eosinophils Relative: 0 %
HEMATOCRIT: 41 % (ref 35.0–47.0)
HEMOGLOBIN: 14.3 g/dL (ref 12.0–16.0)
Lymphocytes Relative: 33 %
Lymphs Abs: 2.3 10*3/uL (ref 1.0–3.6)
MCH: 31.2 pg (ref 26.0–34.0)
MCHC: 34.7 g/dL (ref 32.0–36.0)
MCV: 89.7 fL (ref 80.0–100.0)
Monocytes Absolute: 0.5 10*3/uL (ref 0.2–0.9)
Monocytes Relative: 8 %
NEUTROS ABS: 4 10*3/uL (ref 1.4–6.5)
NEUTROS PCT: 58 %
Platelets: 207 10*3/uL (ref 150–440)
RBC: 4.57 MIL/uL (ref 3.80–5.20)
RDW: 12.3 % (ref 11.5–14.5)
WBC: 6.8 10*3/uL (ref 3.6–11.0)

## 2017-05-05 NOTE — ED Provider Notes (Signed)
-----------------------------------------   12:19 AM on 05/05/2017 -----------------------------------------  Repeat hemoglobin and hematocrit remain stable. Strict return precautions given. Patient verbalizes understanding and agrees with plan of care.   Paulette Blanch, MD 05/05/17 [phone]

## 2017-05-06 LAB — URINE CULTURE: CULTURE: NO GROWTH

## 2017-05-07 NOTE — Telephone Encounter (Signed)
error 

## 2017-06-04 ENCOUNTER — Other Ambulatory Visit: Payer: BLUE CROSS/BLUE SHIELD | Admitting: Urology

## 2017-06-09 ENCOUNTER — Ambulatory Visit (INDEPENDENT_AMBULATORY_CARE_PROVIDER_SITE_OTHER): Payer: BLUE CROSS/BLUE SHIELD | Admitting: Urology

## 2017-06-09 VITALS — BP 123/73 | HR 85 | Ht 66.0 in | Wt 178.0 lb

## 2017-06-09 DIAGNOSIS — C679 Malignant neoplasm of bladder, unspecified: Secondary | ICD-10-CM | POA: Diagnosis not present

## 2017-06-09 LAB — URINALYSIS, COMPLETE
BILIRUBIN UA: NEGATIVE
Glucose, UA: NEGATIVE
Nitrite, UA: NEGATIVE
PH UA: 6 (ref 5.0–7.5)
Protein, UA: NEGATIVE
RBC UA: NEGATIVE
Specific Gravity, UA: 1.015 (ref 1.005–1.030)
Urobilinogen, Ur: 0.2 mg/dL (ref 0.2–1.0)

## 2017-06-09 LAB — MICROSCOPIC EXAMINATION
Bacteria, UA: NONE SEEN
RBC MICROSCOPIC, UA: NONE SEEN /HPF (ref 0–?)

## 2017-06-09 MED ORDER — LIDOCAINE HCL 2 % EX GEL
1.0000 "application " | Freq: Once | CUTANEOUS | Status: AC
  Administered 2017-06-09: 1 via URETHRAL

## 2017-06-09 MED ORDER — DOXYCYCLINE HYCLATE 100 MG PO TABS
100.0000 mg | ORAL_TABLET | Freq: Once | ORAL | Status: AC
  Administered 2017-06-09: 100 mg via ORAL

## 2017-06-09 NOTE — Progress Notes (Signed)
   06/09/17  CC:  Chief Complaint  Patient presents with  . Cysto    HPI: 66 year old female with a history of 1 cm low-grade Ta TCC status post TURBT on 02/2017.  She returns today for surveillance cystoscopy.  Of note, she was recently in the emergency room for rectal bleeding visually related to bleeding hemorrhoid.  She underwent repeat CT abdomen pelvis with contrast at this time on 05/04/2017 indicating resolution of her bladder tumor with notable tiny amount of air in the wall of the urinary bladder.  Bladder appeared somewhat thickened and in close proximity to the sigmoid colon although there did not appear to be interruptions of the fat plane. UA and urine culture at the time was negative.  No gross hematuria or dysuria.  Blood pressure 123/73, pulse 85, height 5\' 6"  (1.676 m), weight 178 lb (80.7 kg). NED. A&Ox3.   No respiratory distress   Abd soft, NT, ND Normal external genitalia with patent urethral meatus  Cystoscopy Procedure Note  Patient identification was confirmed, informed consent was obtained, and patient was prepped using Betadine solution.  Lidocaine jelly was administered per urethral meatus.    Preoperative abx where received prior to procedure.    Procedure: - Flexible cystoscope introduced, without any difficulty.   - Thorough search of the bladder revealed:    normal urethral meatus    normal urothelium    no stones    no ulcers     no tumors    no urethral polyps    no trabeculation    Stellate scar on right posterior bladder wall  - Ureteral orifices were normal in position and appearance.  Post-Procedure: - Patient tolerated the procedure well  Assessment/ Plan:  1. History of bladder cancer No evidence of disease today on cystoscopy, reassuring  Return in about 6 months (around 12/07/2017) for CYSTO.  Hollice Espy, MD

## 2017-06-23 DIAGNOSIS — I10 Essential (primary) hypertension: Secondary | ICD-10-CM | POA: Diagnosis not present

## 2017-06-23 DIAGNOSIS — K5730 Diverticulosis of large intestine without perforation or abscess without bleeding: Secondary | ICD-10-CM | POA: Diagnosis not present

## 2017-06-23 DIAGNOSIS — R1011 Right upper quadrant pain: Secondary | ICD-10-CM | POA: Diagnosis not present

## 2017-06-23 DIAGNOSIS — R61 Generalized hyperhidrosis: Secondary | ICD-10-CM | POA: Diagnosis not present

## 2017-06-23 DIAGNOSIS — N12 Tubulo-interstitial nephritis, not specified as acute or chronic: Secondary | ICD-10-CM | POA: Diagnosis not present

## 2017-06-23 DIAGNOSIS — Z79899 Other long term (current) drug therapy: Secondary | ICD-10-CM | POA: Diagnosis not present

## 2017-06-23 DIAGNOSIS — R0602 Shortness of breath: Secondary | ICD-10-CM | POA: Diagnosis not present

## 2017-06-23 DIAGNOSIS — R000 Tachycardia, unspecified: Secondary | ICD-10-CM | POA: Diagnosis not present

## 2017-06-23 DIAGNOSIS — R110 Nausea: Secondary | ICD-10-CM | POA: Diagnosis not present

## 2017-06-29 DIAGNOSIS — Z6829 Body mass index (BMI) 29.0-29.9, adult: Secondary | ICD-10-CM | POA: Diagnosis not present

## 2017-06-29 DIAGNOSIS — R1011 Right upper quadrant pain: Secondary | ICD-10-CM | POA: Diagnosis not present

## 2017-06-29 DIAGNOSIS — E663 Overweight: Secondary | ICD-10-CM | POA: Diagnosis not present

## 2017-06-29 DIAGNOSIS — F419 Anxiety disorder, unspecified: Secondary | ICD-10-CM | POA: Diagnosis not present

## 2017-06-29 DIAGNOSIS — Z23 Encounter for immunization: Secondary | ICD-10-CM | POA: Diagnosis not present

## 2017-07-20 DIAGNOSIS — Z124 Encounter for screening for malignant neoplasm of cervix: Secondary | ICD-10-CM | POA: Diagnosis not present

## 2017-07-20 DIAGNOSIS — N816 Rectocele: Secondary | ICD-10-CM | POA: Diagnosis not present

## 2017-07-28 DIAGNOSIS — M1712 Unilateral primary osteoarthritis, left knee: Secondary | ICD-10-CM | POA: Diagnosis not present

## 2017-07-28 DIAGNOSIS — M25562 Pain in left knee: Secondary | ICD-10-CM | POA: Diagnosis not present

## 2017-08-09 DIAGNOSIS — M545 Low back pain: Secondary | ICD-10-CM | POA: Diagnosis not present

## 2017-08-09 DIAGNOSIS — Z6828 Body mass index (BMI) 28.0-28.9, adult: Secondary | ICD-10-CM | POA: Diagnosis not present

## 2017-08-19 DIAGNOSIS — Z0000 Encounter for general adult medical examination without abnormal findings: Secondary | ICD-10-CM | POA: Diagnosis not present

## 2017-08-19 DIAGNOSIS — Z23 Encounter for immunization: Secondary | ICD-10-CM | POA: Diagnosis not present

## 2017-08-19 DIAGNOSIS — I10 Essential (primary) hypertension: Secondary | ICD-10-CM | POA: Diagnosis not present

## 2017-08-19 DIAGNOSIS — Z6828 Body mass index (BMI) 28.0-28.9, adult: Secondary | ICD-10-CM | POA: Diagnosis not present

## 2017-08-28 DIAGNOSIS — R1032 Left lower quadrant pain: Secondary | ICD-10-CM | POA: Diagnosis not present

## 2017-08-31 ENCOUNTER — Other Ambulatory Visit: Payer: Self-pay

## 2017-08-31 ENCOUNTER — Encounter (HOSPITAL_COMMUNITY): Payer: Self-pay

## 2017-08-31 ENCOUNTER — Observation Stay (HOSPITAL_COMMUNITY)
Admission: EM | Admit: 2017-08-31 | Discharge: 2017-09-01 | Disposition: A | Discharge status: Home / Self Care | Payer: BLUE CROSS/BLUE SHIELD | Attending: Internal Medicine | Admitting: Internal Medicine

## 2017-08-31 ENCOUNTER — Emergency Department (HOSPITAL_COMMUNITY): Payer: BLUE CROSS/BLUE SHIELD

## 2017-08-31 DIAGNOSIS — K5732 Diverticulitis of large intestine without perforation or abscess without bleeding: Secondary | ICD-10-CM | POA: Diagnosis not present

## 2017-08-31 DIAGNOSIS — R079 Chest pain, unspecified: Secondary | ICD-10-CM | POA: Diagnosis not present

## 2017-08-31 DIAGNOSIS — N189 Chronic kidney disease, unspecified: Secondary | ICD-10-CM | POA: Insufficient documentation

## 2017-08-31 DIAGNOSIS — Z9049 Acquired absence of other specified parts of digestive tract: Secondary | ICD-10-CM | POA: Diagnosis not present

## 2017-08-31 DIAGNOSIS — E785 Hyperlipidemia, unspecified: Secondary | ICD-10-CM | POA: Insufficient documentation

## 2017-08-31 DIAGNOSIS — Z79899 Other long term (current) drug therapy: Secondary | ICD-10-CM | POA: Insufficient documentation

## 2017-08-31 DIAGNOSIS — Z885 Allergy status to narcotic agent status: Secondary | ICD-10-CM | POA: Diagnosis not present

## 2017-08-31 DIAGNOSIS — Z87442 Personal history of urinary calculi: Secondary | ICD-10-CM | POA: Diagnosis not present

## 2017-08-31 DIAGNOSIS — I129 Hypertensive chronic kidney disease with stage 1 through stage 4 chronic kidney disease, or unspecified chronic kidney disease: Secondary | ICD-10-CM | POA: Diagnosis not present

## 2017-08-31 DIAGNOSIS — Z880 Allergy status to penicillin: Secondary | ICD-10-CM | POA: Diagnosis not present

## 2017-08-31 DIAGNOSIS — K5792 Diverticulitis of intestine, part unspecified, without perforation or abscess without bleeding: Secondary | ICD-10-CM

## 2017-08-31 DIAGNOSIS — K5730 Diverticulosis of large intestine without perforation or abscess without bleeding: Secondary | ICD-10-CM | POA: Diagnosis not present

## 2017-08-31 LAB — CBC WITH DIFFERENTIAL/PLATELET
BASOS ABS: 0 10*3/uL (ref 0.0–0.1)
Basophils Relative: 0 %
Eosinophils Absolute: 0 10*3/uL (ref 0.0–0.7)
Eosinophils Relative: 0 %
HEMATOCRIT: 40.2 % (ref 36.0–46.0)
Hemoglobin: 13.7 g/dL (ref 12.0–15.0)
LYMPHS ABS: 2 10*3/uL (ref 0.7–4.0)
LYMPHS PCT: 31 %
MCH: 31.1 pg (ref 26.0–34.0)
MCHC: 34.1 g/dL (ref 30.0–36.0)
MCV: 91.2 fL (ref 78.0–100.0)
Monocytes Absolute: 0.5 10*3/uL (ref 0.1–1.0)
Monocytes Relative: 7 %
NEUTROS ABS: 4 10*3/uL (ref 1.7–7.7)
Neutrophils Relative %: 62 %
Platelets: 233 10*3/uL (ref 150–400)
RBC: 4.41 MIL/uL (ref 3.87–5.11)
RDW: 12.8 % (ref 11.5–15.5)
WBC: 6.5 10*3/uL (ref 4.0–10.5)

## 2017-08-31 LAB — COMPREHENSIVE METABOLIC PANEL
ALBUMIN: 4.3 g/dL (ref 3.5–5.0)
ALT: 24 U/L (ref 14–54)
ANION GAP: 9 (ref 5–15)
AST: 27 U/L (ref 15–41)
Alkaline Phosphatase: 46 U/L (ref 38–126)
BILIRUBIN TOTAL: 0.7 mg/dL (ref 0.3–1.2)
BUN: 9 mg/dL (ref 6–20)
CHLORIDE: 104 mmol/L (ref 101–111)
CO2: 24 mmol/L (ref 22–32)
Calcium: 9.3 mg/dL (ref 8.9–10.3)
Creatinine, Ser: 0.95 mg/dL (ref 0.44–1.00)
GFR calc Af Amer: >60 mL/min (ref >60)
GLUCOSE: 118 mg/dL — AB (ref 65–99)
POTASSIUM: 3.5 mmol/L (ref 3.5–5.1)
Sodium: 137 mmol/L (ref 135–145)
TOTAL PROTEIN: 6.9 g/dL (ref 6.5–8.1)

## 2017-08-31 LAB — LIPASE, BLOOD: Lipase: 24 U/L (ref 11–51)

## 2017-08-31 LAB — I-STAT CG4 LACTIC ACID, ED
LACTIC ACID, VENOUS: 1.48 mmol/L (ref 0.5–1.9)
Lactic Acid, Venous: 0.57 mmol/L (ref 0.5–1.9)

## 2017-08-31 LAB — I-STAT TROPONIN, ED: Troponin i, poc: 0 ng/mL (ref 0.00–0.08)

## 2017-08-31 MED ORDER — ONDANSETRON HCL 4 MG/2ML IJ SOLN
4.0000 mg | Freq: Once | INTRAMUSCULAR | Status: AC
  Administered 2017-08-31: 4 mg via INTRAVENOUS
  Filled 2017-08-31: qty 2

## 2017-08-31 MED ORDER — MORPHINE SULFATE (PF) 4 MG/ML IV SOLN
4.0000 mg | Freq: Once | INTRAVENOUS | Status: AC
  Administered 2017-08-31: 4 mg via INTRAVENOUS
  Filled 2017-08-31: qty 1

## 2017-08-31 MED ORDER — NITROGLYCERIN 0.4 MG SL SUBL: 0.4000 mg | SUBLINGUAL_TABLET | SUBLINGUAL | Status: DC | PRN

## 2017-08-31 MED ORDER — KETOROLAC TROMETHAMINE 30 MG/ML IJ SOLN
30.0000 mg | Freq: Once | INTRAMUSCULAR | Status: AC
  Administered 2017-09-01: 30 mg via INTRAVENOUS
  Filled 2017-08-31: qty 1

## 2017-08-31 MED ORDER — MORPHINE SULFATE (PF) 4 MG/ML IV SOLN
4.0000 mg | Freq: Once | INTRAVENOUS | Status: AC
  Administered 2017-09-01: 4 mg via INTRAVENOUS
  Filled 2017-08-31: qty 1

## 2017-08-31 MED ORDER — IOPAMIDOL (ISOVUE-300) INJECTION 61%
INTRAVENOUS | Status: AC
  Administered 2017-08-31: 100 mL
  Filled 2017-08-31: qty 100

## 2017-08-31 MED ORDER — SODIUM CHLORIDE 0.9 % IV BOLUS (SEPSIS)
1000.0000 mL | Freq: Once | INTRAVENOUS | Status: AC
  Administered 2017-08-31: 1000 mL via INTRAVENOUS

## 2017-08-31 NOTE — Discharge Instructions (Signed)
Please read and follow all provided instructions.  Your diagnoses today include:  1. Diverticulitis    Tests performed today include: Vital signs. See below for your results today.   Medications prescribed:  Take as prescribed. Continue your antibiotics as indicated. It's working!  Home care instructions:  Follow any educational materials contained in this packet.  Follow-up instructions: Please follow-up with your Gastroenterologist for further evaluation of symptoms and treatment   Return instructions:  Please return to the Emergency Department if you do not get better, if you get worse, or new symptoms OR  - Fever (temperature greater than 101.27F)  - Bleeding that does not stop with holding pressure to the area    -Severe pain (please note that you may be more sore the day after your accident)  - Chest Pain  - Difficulty breathing  - Severe nausea or vomiting  - Inability to tolerate food and liquids  - Passing out  - Skin becoming red around your wounds  - Change in mental status (confusion or lethargy)  - New numbness or weakness    Please return if you have any other emergent concerns.  Additional Information:  Your vital signs today were: BP (!) 170/84    Pulse 87    Temp 98 F (36.7 C) (Oral)    Resp 17    Ht 5\' 6"  (1.676 m)    Wt 79.8 kg (176 lb)    SpO2 100%    BMI 28.41 kg/m  If your blood pressure (BP) was elevated above 135/85 this visit, please have this repeated by your doctor within one month. ---------------

## 2017-08-31 NOTE — ED Provider Notes (Signed)
[facility] EMERGENCY DEPARTMENT Provider Note   CSN: [id number] Arrival date & time: 08/31/17  1443     History   Chief Complaint Chief Complaint  Patient presents with  . diverticulitis/reaction to med?    HPI Gloria Bell is a 66 y.o. female.  HPI  66 y.o. female with a hx of CKD, Diverticulosis, HTN, HLD, presents to the Emergency Department today due to abdominal pain since last week. Seen at [facility] over weekend (Saturday) and started on Cipro/Flagyl. Pt compliant with medications. Notes worsening symptoms. Known hx Diverticulitis with abscess. Told to come to ED for CT Abdomen. Notes pain 10/10. Localized LLQ. Throbbing sensation. Notes Nausea. No emesis. Intermittent loose stools. No hematochezia. No CP/SOB. No fevers. No dysuria. Hydrocodone with minimal relief. No other symptoms noted   Past Medical History:  Diagnosis Date  . Arthritis   . Chronic kidney disease    Bladder Tumor  . Diverticulitis   . Diverticulosis   . Fall from ladder 09/2003  . History of kidney stones 1987  . Hyperlipidemia   . Hypertension   . Osteoarthritis   . Osteoporosis     Patient Active Problem List   Diagnosis Date Noted  . HLD (hyperlipidemia) 05/04/2017  . Rectal bleed 05/04/2017  . Colovesical fistula 05/04/2017  . GI bleed 05/18/2015  . Bleeding internal hemorrhoids 01/25/2013  . Abdominal pain, acute, left lower quadrant 04/18/2012  . Intra-abdominal abscess ([place]) 01/06/2012  . Diverticulitis 01/05/2012  . HTN (hypertension) 01/05/2012    Past Surgical History:  Procedure Laterality Date  . ABDOMINAL HYSTERECTOMY    . APPENDECTOMY    . CHOLECYSTECTOMY  1992  . CYSTOSCOPY W/ RETROGRADES Bilateral 03/01/2017   Procedure: CYSTOSCOPY WITH RETROGRADE PYELOGRAM;  Surgeon: Hollice Espy, MD;  Location: [facility] ORS;  Service: Urology;  Laterality: Bilateral;  . ELBOW FRACTURE SURGERY Right   . FRACTURE SURGERY    . KNEE SURGERY Bilateral   . OTHER  SURGICAL HISTORY     had to be on percocet for a year and it slowed down her "gut"   . TRANSURETHRAL RESECTION OF BLADDER TUMOR WITH MITOMYCIN-C N/A 03/01/2017   Procedure: TRANSURETHRAL RESECTION OF BLADDER TUMOR WITH MITOMYCIN-C;  Surgeon: Hollice Espy, MD;  Location: [facility] ORS;  Service: Urology;  Laterality: N/A;    OB History    No data available       Home Medications    Prior to Admission medications   Medication Sig Start Date End Date Taking? Authorizing Provider  losartan (COZAAR) 100 MG tablet Take 100 mg by mouth daily.    [provider]  metoprolol succinate (TOPROL-XL) 50 MG 24 hr tablet Take 50 mg by mouth daily. Take with or immediately following a meal.    [provider]  Multiple Vitamin (MULTIVITAMIN WITH MINERALS) TABS tablet Take 1 tablet by mouth daily.    [provider]  polyethylene glycol (MIRALAX / GLYCOLAX) packet Take 17 g by mouth 2 (two) times daily. Takes at 3 pm and 7 pm    [provider]  simvastatin (ZOCOR) 20 MG tablet Take 20 mg by mouth every morning.    [provider]    Family History Family History  Problem Relation Age of Onset  . Heart disease Father        heart attack  . Diabetes Father   . Cancer Paternal Grandfather        prostate    Social History Social History  Tobacco Use  . Smoking status: Never Smoker  . Smokeless tobacco: Never Used  Substance Use Topics  . Alcohol use: No  . Drug use: No     Allergies   Ampicillin; Cephalexin; Ciprofloxacin; Codeine; Flagyl [metronidazole]; Hydrochlorothiazide; Oxycodone-acetaminophen; Sulfa antibiotics; and Vicodin [hydrocodone-acetaminophen]   Review of Systems Review of Systems ROS reviewed and all are negative for acute change except as noted in the HPI.  Physical Exam Updated Vital Signs BP (!) 157/95 (BP Location: Right Arm)   Pulse 79   Temp 98 F (36.7 C) (Oral)   Resp 17   Ht 5\' 6"  (1.676 m)   Wt 79.8 kg (176  lb)   SpO2 100%   BMI 28.41 kg/m   Physical Exam  Constitutional: She is oriented to person, place, and time. Vital signs are normal. She appears well-developed and well-nourished. No distress.  HENT:  Head: Normocephalic and atraumatic.  Right Ear: Hearing, tympanic membrane, external ear and ear canal normal.  Left Ear: Hearing, tympanic membrane, external ear and ear canal normal.  Nose: Nose normal.  Mouth/Throat: Uvula is midline, oropharynx is clear and moist and mucous membranes are normal. No trismus in the jaw. No oropharyngeal exudate, posterior oropharyngeal erythema or tonsillar abscesses.  Eyes: Conjunctivae and EOM are normal. Pupils are equal, round, and reactive to light.  Neck: Normal range of motion. Neck supple. No tracheal deviation present.  Cardiovascular: Normal rate, regular rhythm, S1 normal, S2 normal, normal heart sounds, intact distal pulses and normal pulses.  Pulmonary/Chest: Effort normal and breath sounds normal. No respiratory distress. She has no decreased breath sounds. She has no wheezes. She has no rhonchi. She has no rales.  Abdominal: Normal appearance and bowel sounds are normal. There is tenderness in the left lower quadrant. There is no rigidity, no rebound, no guarding, no CVA tenderness, no tenderness at McBurney's point and negative Murphy's sign.  Musculoskeletal: Normal range of motion.  Neurological: She is alert and oriented to person, place, and time.  Skin: Skin is warm and dry.  Psychiatric: She has a normal mood and affect. Her speech is normal and behavior is normal. Thought content normal.  Nursing note and vitals reviewed.  ED Treatments / Results  Labs (all labs ordered are listed, but only abnormal results are displayed) Labs Reviewed  COMPREHENSIVE METABOLIC PANEL - Abnormal; Notable for the following components:      Result Value   Glucose, Bld 118 (*)    All other components within normal limits  CBC WITH  DIFFERENTIAL/PLATELET  LIPASE, BLOOD  I-STAT CG4 LACTIC ACID, ED  I-STAT CG4 LACTIC ACID, ED  I-STAT TROPONIN, ED    EKG  EKG Interpretation  Date/Time:  Tuesday August 31 2017 22:41:23 EST Ventricular Rate:  76 PR Interval:    QRS Duration: 105 QT Interval:  418 QTC Calculation: 470 R Axis:   61 Text Interpretation:  Sinus rhythm Borderline short PR interval RSR' in V1 or V2, right VCD or RVH Borderline T abnormalities, anterior leads Confirmed by Dene Gentry [phone]) on 08/31/2017 10:47:09 PM       Radiology Ct Abdomen Pelvis W Contrast  Result Date: 08/31/2017 CLINICAL DATA:  Abdominal pain and history of diverticulosis. Nausea. EXAM: CT ABDOMEN AND PELVIS WITH CONTRAST TECHNIQUE: Multidetector CT imaging of the abdomen and pelvis was performed using the standard protocol following bolus administration of intravenous contrast. CONTRAST:  100 mL ISOVUE-300 IOPAMIDOL (ISOVUE-300) INJECTION 61% COMPARISON:  CT AP 06/23/2017 FINDINGS: Lower chest: No pulmonary nodules or  pleural effusion. No visible pericardial effusion. Hepatobiliary: Normal hepatic contours and density. No visible biliary dilatation. Focal fatty infiltration adjacent to the falciform ligament. Status post cholecystectomy. Pancreas: Normal contours without ductal dilatation. No peripancreatic fluid collection. Spleen: Normal. Adrenals/Urinary Tract: --Adrenal glands: Normal. --Right kidney/ureter: No hydronephrosis or perinephric stranding. No nephrolithiasis. No obstructing ureteral stones. --Left kidney/ureter: No hydronephrosis or perinephric stranding. No nephrolithiasis. No obstructing ureteral stones. --Urinary bladder: Unremarkable. Stomach/Bowel: --Stomach/Duodenum: No hiatal hernia or other gastric abnormality. Normal duodenal course and caliber. --Small bowel: No dilatation or inflammation. --Colon: There is rectosigmoid diverticulosis without evidence for acute inflammation. There is fluid throughout the  colon. --Appendix: Surgically absent. Vascular/Lymphatic: Normal course and caliber of the major abdominal vessels. No abdominal or pelvic lymphadenopathy. Reproductive: Status post hysterectomy. No adnexal mass. Musculoskeletal. No bony spinal canal stenosis or focal osseous abnormality. Other: None. IMPRESSION: 1. No acute abnormality of the abdomen or pelvis. 2. Rectosigmoid diverticulosis without acute inflammation. 3. Fluid throughout the colon is in keeping with a diarrheal illness. Electronically Signed   By: Ulyses Jarred M.D.   On: 08/31/2017 20:56    Procedures Procedures (including critical care time)  Medications Ordered in ED Medications  morphine 4 MG/ML injection 4 mg (4 mg Intravenous Given 08/31/17 1954)  sodium chloride 0.9 % bolus 1,000 mL (0 mLs Intravenous Stopped 08/31/17 2256)  ondansetron (ZOFRAN) injection 4 mg (4 mg Intravenous Given 08/31/17 1954)  iopamidol (ISOVUE-300) 61 % injection (100 mLs  Contrast Given 08/31/17 2024)     Initial Impression / Assessment and Plan / ED Course  I have reviewed the triage vital signs and the nursing notes.  Pertinent labs & imaging results that were available during my care of the patient were reviewed by me and considered in my medical decision making (see chart for details).  Final Clinical Impressions(s) / ED Diagnoses  {I have reviewed and evaluated the relevant laboratory values.   {I have reviewed the relevant previous healthcare records.  {I obtained HPI from historian.   ED Course:  Assessment: Pt is a 67 y.o. female with a hx of CKD, Diverticulosis, HTN, HLD, presents to the Emergency Department today due to abdominal pain since last week. Seen at [facility] over weekend (Saturday) and started on Cipro/Flagyl. Pt compliant with medications. Notes worsening symptoms. Known hx Diverticulitis with abscess. Told to come to ED for CT Abdomen. Notes pain 10/10. Localized LLQ. Throbbing sensation. Notes Nausea. No emesis. Intermittent  loose stools. No hematochezia. No CP/SOB. No fevers. No dysuria. Hydrocodone with minimal relief. On exam, pt in NAD. Nontoxic/nonseptic appearing. VSS. Afebrile. Lungs CTA. Heart RRR. Abdomen TTP LLQ. CBC unremarkable. MP unremarkable. CT Abdomen/Pelvis without acute diverticulitis. No abscess. Likely resolving. Given fluids and analgesia in ED. Plan is to DC home with continued therapy as pt still with ABX and close follow up to GI. Of note, pt started to have chest pressure during ED stay. EKG unremarkable. Trop negative. Pain was palpable on exam on left anterior chest. But pt with levine's sign with radiation into left shoulder. Notes nausea. No diaphoresis. No numbness/tingling. No hx ACS. Risk Factors with Obesity, HTN, HLD. No FH. Heart Score 4. Given analgesia with improvement. Denies chest pain currently. Seen by attending physician. Will admit for observation.    Disposition/Plan:  Admit Pt acknowledges and agrees with plan  Supervising Physician Valarie Merino, MD  Final diagnoses:  Diverticulitis  Chest pain, unspecified type    ED Discharge Orders    None  Gloria Decamp, PA-C 08/31/17 2257    Gloria Decamp, PA-C 09/01/17 0038    Valarie Merino, MD 09/27/17 [phone]

## 2017-08-31 NOTE — ED Triage Notes (Signed)
Patient here with complaint of abdominal discomfort, seen at an urgent care on Saturday and taking cipro and flagyl as prescribed with increased pain and vomiting. States that she had diarrhea that is no longer an issue and thinks she has recurrent abdominal abscess. Complains of fatigue with same and reports intermittent fevers

## 2017-09-01 ENCOUNTER — Other Ambulatory Visit: Payer: Self-pay

## 2017-09-01 DIAGNOSIS — R079 Chest pain, unspecified: Secondary | ICD-10-CM | POA: Diagnosis present

## 2017-09-01 DIAGNOSIS — K5792 Diverticulitis of intestine, part unspecified, without perforation or abscess without bleeding: Secondary | ICD-10-CM | POA: Diagnosis not present

## 2017-09-01 LAB — TROPONIN I: Troponin I: <0.03 ng/mL (ref <0.03)

## 2017-09-01 MED ORDER — SIMVASTATIN 20 MG PO TABS
20.0000 mg | ORAL_TABLET | Freq: Every day | ORAL | Status: DC
  Administered 2017-09-01: 20 mg via ORAL
  Filled 2017-09-01: qty 1

## 2017-09-01 MED ORDER — HYDROCODONE-ACETAMINOPHEN 5-325 MG PO TABS: 1.0000 | ORAL_TABLET | Freq: Four times a day (QID) | ORAL | Status: DC | PRN

## 2017-09-01 MED ORDER — CIPROFLOXACIN HCL 500 MG PO TABS: 500.0000 mg | ORAL_TABLET | Freq: Two times a day (BID) | ORAL | Status: DC

## 2017-09-01 MED ORDER — ENOXAPARIN SODIUM 40 MG/0.4ML ~~LOC~~ SOLN
40.0000 mg | Freq: Every day | SUBCUTANEOUS | Status: DC
  Administered 2017-09-01: 40 mg via SUBCUTANEOUS
  Filled 2017-09-01: qty 0.4

## 2017-09-01 MED ORDER — AMOXICILLIN-POT CLAVULANATE 875-125 MG PO TABS
1.0000 | ORAL_TABLET | Freq: Two times a day (BID) | ORAL | Status: DC
  Administered 2017-09-01: 1 via ORAL
  Filled 2017-09-01: qty 1

## 2017-09-01 MED ORDER — GI COCKTAIL ~~LOC~~
30.0000 mL | Freq: Once | ORAL | Status: AC
  Administered 2017-09-01: 30 mL via ORAL
  Filled 2017-09-01: qty 30

## 2017-09-01 MED ORDER — AMOXICILLIN-POT CLAVULANATE 875-125 MG PO TABS
1.0000 | ORAL_TABLET | Freq: Two times a day (BID) | ORAL | 0 refills | Status: DC
Start: 2017-09-01 — End: 2017-12-07

## 2017-09-01 MED ORDER — METRONIDAZOLE 500 MG PO TABS: 500.0000 mg | ORAL_TABLET | Freq: Two times a day (BID) | ORAL | Status: DC

## 2017-09-01 MED ORDER — POLYETHYLENE GLYCOL 3350 17 G PO PACK
17.0000 g | PACK | Freq: Two times a day (BID) | ORAL | Status: DC
  Filled 2017-09-01: qty 1

## 2017-09-01 MED ORDER — ACETAMINOPHEN 325 MG PO TABS: 650.0000 mg | ORAL_TABLET | ORAL | Status: DC | PRN

## 2017-09-01 MED ORDER — ONDANSETRON HCL 4 MG PO TABS: 4.0000 mg | ORAL_TABLET | ORAL | 0 refills | Status: DC | PRN

## 2017-09-01 MED ORDER — ONDANSETRON HCL 4 MG/2ML IJ SOLN
4.0000 mg | Freq: Four times a day (QID) | INTRAMUSCULAR | Status: DC | PRN
  Administered 2017-09-01: 4 mg via INTRAVENOUS
  Filled 2017-09-01: qty 2

## 2017-09-01 MED ORDER — LOSARTAN POTASSIUM 50 MG PO TABS
50.0000 mg | ORAL_TABLET | Freq: Two times a day (BID) | ORAL | Status: DC
  Administered 2017-09-01: 50 mg via ORAL
  Filled 2017-09-01 (×2): qty 1

## 2017-09-01 MED ORDER — ZOLPIDEM TARTRATE 5 MG PO TABS: 5.0000 mg | ORAL_TABLET | Freq: Every evening | ORAL | Status: DC | PRN

## 2017-09-01 MED ORDER — METOPROLOL SUCCINATE ER 50 MG PO TB24
50.0000 mg | ORAL_TABLET | Freq: Every day | ORAL | Status: DC
  Administered 2017-09-01: 50 mg via ORAL
  Filled 2017-09-01: qty 1

## 2017-09-01 NOTE — ED Notes (Signed)
Pt. Took home antibiotics. Verified with PA.

## 2017-09-01 NOTE — Discharge Summary (Signed)
Physician Discharge Summary  [place] LFY:[id number] DOB: 1951-03-05 DOA: 08/31/2017  PCP: Philmore Pali, NP  Admit date: 08/31/2017 Discharge date: 09/01/2017   Recommendations for Outpatient Follow-Up:   1. Change abx to PO augmentin-- will need GI follow up   Discharge Diagnosis:   Active Problems:   Chest pain, rule out acute myocardial infarction   Discharge disposition:  Home  Discharge Condition: Improved.  Diet recommendation: Low residue  Wound care: None.   History of Present Illness:   Gloria Bell is a 66 y.o. female with medical history significant of diverticulosis, HTN, HLD, narcotic bowel syndrome.  Patient presents to the ED with c/o abd pain since last week.  Seen at UC over the weekend.  Started on cipro/flagyl.  Taking these meds.  Symptoms persistent.  Concerned for abscess given she has had this before, presents to ED.    Hospital Course by Problem:   Chest pain  -CE negative -suspect GI related  diverticulitis -change to PO augmentin as patient does not tolerate cipro/flagyl well -CT abd does not show abscess but rather improving infection    Medical Consultants:    None.   Discharge Exam:   Vitals:   09/01/17 0734 09/01/17 0940  BP: 135/68 (!) 145/75  Pulse: 94 72  Resp: 18   Temp: 98.4 F (36.9 C)   SpO2: 97% 98%   Vitals:   09/01/17 0232 09/01/17 0521 09/01/17 0734 09/01/17 0940  BP: (!) 149/92 (!) 113/40 135/68 (!) 145/75  Pulse: 83  94 72  Resp: 18 18 18    Temp: 98.4 F (36.9 C) 98.2 F (36.8 C) 98.4 F (36.9 C)   TempSrc: Oral Oral Oral   SpO2: 98% 96% 97% 98%  Weight: 78 kg (172 lb)     Height: 5\' 6"  (1.676 m)       Gen:  NAD    The results of significant diagnostics from this hospitalization (including imaging, microbiology, ancillary and laboratory) are listed below for reference.     Procedures and Diagnostic Studies:   Ct Abdomen Pelvis W Contrast  Result Date: 08/31/2017 CLINICAL  DATA:  Abdominal pain and history of diverticulosis. Nausea. EXAM: CT ABDOMEN AND PELVIS WITH CONTRAST TECHNIQUE: Multidetector CT imaging of the abdomen and pelvis was performed using the standard protocol following bolus administration of intravenous contrast. CONTRAST:  100 mL ISOVUE-300 IOPAMIDOL (ISOVUE-300) INJECTION 61% COMPARISON:  CT AP 06/23/2017 FINDINGS: Lower chest: No pulmonary nodules or pleural effusion. No visible pericardial effusion. Hepatobiliary: Normal hepatic contours and density. No visible biliary dilatation. Focal fatty infiltration adjacent to the falciform ligament. Status post cholecystectomy. Pancreas: Normal contours without ductal dilatation. No peripancreatic fluid collection. Spleen: Normal. Adrenals/Urinary Tract: --Adrenal glands: Normal. --Right kidney/ureter: No hydronephrosis or perinephric stranding. No nephrolithiasis. No obstructing ureteral stones. --Left kidney/ureter: No hydronephrosis or perinephric stranding. No nephrolithiasis. No obstructing ureteral stones. --Urinary bladder: Unremarkable. Stomach/Bowel: --Stomach/Duodenum: No hiatal hernia or other gastric abnormality. Normal duodenal course and caliber. --Small bowel: No dilatation or inflammation. --Colon: There is rectosigmoid diverticulosis without evidence for acute inflammation. There is fluid throughout the colon. --Appendix: Surgically absent. Vascular/Lymphatic: Normal course and caliber of the major abdominal vessels. No abdominal or pelvic lymphadenopathy. Reproductive: Status post hysterectomy. No adnexal mass. Musculoskeletal. No bony spinal canal stenosis or focal osseous abnormality. Other: None. IMPRESSION: 1. No acute abnormality of the abdomen or pelvis. 2. Rectosigmoid diverticulosis without acute inflammation. 3. Fluid throughout the colon is in keeping with a diarrheal illness.  Electronically Signed   By: Ulyses Jarred M.D.   On: 08/31/2017 20:56     Labs:   Basic Metabolic Panel: Recent  Labs  Lab 08/31/17 1517  NA 137  K 3.5  CL 104  CO2 24  GLUCOSE 118*  BUN 9  CREATININE 0.95  CALCIUM 9.3   GFR Estimated Creatinine Clearance: 61.4 mL/min (by C-G formula based on SCr of 0.95 mg/dL). Liver Function Tests: Recent Labs  Lab 08/31/17 1517  AST 27  ALT 24  ALKPHOS 46  BILITOT 0.7  PROT 6.9  ALBUMIN 4.3   Recent Labs  Lab 08/31/17 1517  LIPASE 24   No results for input(s): AMMONIA in the last 168 hours. Coagulation profile No results for input(s): INR, PROTIME in the last 168 hours.  CBC: Recent Labs  Lab 08/31/17 1517  WBC 6.5  NEUTROABS 4.0  HGB 13.7  HCT 40.2  MCV 91.2  PLT 233   Cardiac Enzymes: Recent Labs  Lab 09/01/17 0230 09/01/17 0635  TROPONINI <0.03 <0.03   BNP: Invalid input(s): POCBNP CBG: No results for input(s): GLUCAP in the last 168 hours. D-Dimer No results for input(s): DDIMER in the last 72 hours. Hgb A1c No results for input(s): HGBA1C in the last 72 hours. Lipid Profile No results for input(s): CHOL, HDL, LDLCALC, TRIG, CHOLHDL, LDLDIRECT in the last 72 hours. Thyroid function studies No results for input(s): TSH, T4TOTAL, T3FREE, THYROIDAB in the last 72 hours.  Invalid input(s): FREET3 Anemia work up No results for input(s): VITAMINB12, FOLATE, FERRITIN, TIBC, IRON, RETICCTPCT in the last 72 hours. Microbiology No results found for this or any previous visit (from the past 240 hour(s)).   Discharge Instructions:   Discharge Instructions    Diet - low sodium heart healthy   Complete by:  As directed    Increase activity slowly   Complete by:  As directed      Allergies as of 09/01/2017      Reactions   Ampicillin Nausea And Vomiting   Has patient had a PCN reaction causing immediate rash, facial/tongue/throat swelling, SOB or lightheadedness with hypotension: No Has patient had a PCN reaction causing severe rash involving mucus membranes or skin necrosis: No Has patient had a PCN reaction that  required hospitalization: No Has patient had a PCN reaction occurring within the last 10 years: No If all of the above answers are "NO", then may proceed with Cephalosporin use..   Cephalexin Nausea And Vomiting   Ciprofloxacin Dermatitis   Pt states felt like "burning under her skin all over"   Codeine Nausea And Vomiting   Flagyl [metronidazole] Dermatitis   States previously had n/v to this med then had a rxt with "burning skin all over"   Hydrochlorothiazide Nausea And Vomiting   Oxycodone-acetaminophen Nausea And Vomiting   Sulfa Antibiotics Nausea And Vomiting   Vicodin [hydrocodone-acetaminophen] Nausea And Vomiting      Medication List    STOP taking these medications   ciprofloxacin 500 MG tablet Commonly known as:  CIPRO   metroNIDAZOLE 500 MG tablet Commonly known as:  FLAGYL     TAKE these medications   amoxicillin-clavulanate 875-125 MG tablet Commonly known as:  AUGMENTIN Take 1 tablet by mouth every 12 (twelve) hours.   HYDROcodone-acetaminophen 5-325 MG tablet Commonly known as:  NORCO/VICODIN Take 1 tablet by mouth as needed.   losartan 50 MG tablet Commonly known as:  COZAAR Take 50 mg by mouth 2 (two) times daily.   metoprolol succinate  50 MG 24 hr tablet Commonly known as:  TOPROL-XL Take 50 mg by mouth daily. Take with or immediately following a meal.   ondansetron 4 MG tablet Commonly known as:  ZOFRAN Take 1 tablet (4 mg total) by mouth as needed.   polyethylene glycol packet Commonly known as:  MIRALAX / GLYCOLAX Take 17 g by mouth 2 (two) times daily. Takes at 3 pm and 7 pm   simvastatin 20 MG tablet Commonly known as:  ZOCOR Take 20 mg by mouth every morning.   TYLENOL 8 HOUR ARTHRITIS PAIN 650 MG CR tablet Generic drug:  acetaminophen Take 1,300 mg by mouth every 8 (eight) hours as needed for pain.   zolpidem 10 MG tablet Commonly known as:  AMBIEN Take 10 mg by mouth daily as needed.      Follow-up Information    Philmore Pali, NP Follow up.   Specialty:  Nurse Practitioner Contact information: [place] [address] [phone]        Laurence Spates, MD. Schedule an appointment as soon as possible for a visit.   Specialty:  Gastroenterology Why:  if not better after antibiotics Contact information: [address] [place] [address] [phone]            Time coordinating discharge: 35 min  Signed:  Geradine Girt   [facility] 09/01/2017, 11:50 AM

## 2017-09-01 NOTE — H&P (Signed)
History and Physical    Gloria Bell IWL:[id number] DOB: 1951-07-29 DOA: 08/31/2017  PCP: Philmore Pali, NP  Patient coming from: Home  I have personally briefly reviewed patient's old medical records in [place]  Chief Complaint: Diverticulitis  HPI: Gloria Bell is a 66 y.o. female with medical history significant of diverticulosis, HTN, HLD, narcotic bowel syndrome.  Patient presents to the ED with c/o abd pain since last week.  Seen at UC over the weekend.  Started on cipro/flagyl.  Taking these meds.  Symptoms persistent.  Concerned for abscess given she has had this before, presents to ED.   ED Course: Pain controled with narcotics.  Had 30 min episode of chest pain while in ED that improved with morphine and GI cocktail.  Trop neg.  EKG shows flattening of T waves laterally.  EDP wants admit for obs.   Review of Systems: As per HPI otherwise 10 point review of systems negative.   Past Medical History:  Diagnosis Date  . Arthritis   . Chronic kidney disease    Bladder Tumor  . Diverticulitis   . Diverticulosis   . Fall from ladder 09/2003  . History of kidney stones 1987  . Hyperlipidemia   . Hypertension   . Osteoarthritis   . Osteoporosis     Past Surgical History:  Procedure Laterality Date  . ABDOMINAL HYSTERECTOMY    . APPENDECTOMY    . CHOLECYSTECTOMY  1992  . CYSTOSCOPY W/ RETROGRADES Bilateral 03/01/2017   Procedure: CYSTOSCOPY WITH RETROGRADE PYELOGRAM;  Surgeon: Hollice Espy, MD;  Location: [facility] ORS;  Service: Urology;  Laterality: Bilateral;  . ELBOW FRACTURE SURGERY Right   . FRACTURE SURGERY    . KNEE SURGERY Bilateral   . OTHER SURGICAL HISTORY     had to be on percocet for a year and it slowed down her "gut"   . TRANSURETHRAL RESECTION OF BLADDER TUMOR WITH MITOMYCIN-C N/A 03/01/2017   Procedure: TRANSURETHRAL RESECTION OF BLADDER TUMOR WITH MITOMYCIN-C;  Surgeon: Hollice Espy, MD;  Location: [facility] ORS;  Service: Urology;   Laterality: N/A;     reports that  has never smoked. she has never used smokeless tobacco. She reports that she does not drink alcohol or use drugs.  Allergies  Allergen Reactions  . Ampicillin Nausea And Vomiting    Has patient had a PCN reaction causing immediate rash, facial/tongue/throat swelling, SOB or lightheadedness with hypotension: No Has patient had a PCN reaction causing severe rash involving mucus membranes or skin necrosis: No Has patient had a PCN reaction that required hospitalization: No Has patient had a PCN reaction occurring within the last 10 years: No If all of the above answers are "NO", then may proceed with Cephalosporin use..   . Cephalexin Nausea And Vomiting  . Ciprofloxacin Dermatitis    Pt states felt like "burning under her skin all over"  . Codeine Nausea And Vomiting  . Flagyl [Metronidazole] Dermatitis    States previously had n/v to this med then had a rxt with "burning skin all over"  . Hydrochlorothiazide Nausea And Vomiting  . Oxycodone-Acetaminophen Nausea And Vomiting  . Sulfa Antibiotics Nausea And Vomiting  . Vicodin [Hydrocodone-Acetaminophen] Nausea And Vomiting    Family History  Problem Relation Age of Onset  . Heart disease Father        heart attack  . Diabetes Father   . Cancer Paternal Grandfather        prostate  Prior to Admission medications   Medication Sig Start Date End Date Taking? Authorizing Provider  acetaminophen (TYLENOL 8 HOUR ARTHRITIS PAIN) 650 MG CR tablet Take 1,300 mg by mouth every 8 (eight) hours as needed for pain.   Yes [provider]  ciprofloxacin (CIPRO) 500 MG tablet Take 1 tablet by mouth every 12 (twelve) hours. 08/28/17  Yes [provider]  HYDROcodone-acetaminophen (NORCO/VICODIN) 5-325 MG tablet Take 1 tablet by mouth as needed. 08/28/17  Yes [provider]  losartan (COZAAR) 50 MG tablet Take 50 mg by mouth 2 (two) times daily.    Yes [provider]    metoprolol succinate (TOPROL-XL) 50 MG 24 hr tablet Take 50 mg by mouth daily. Take with or immediately following a meal.   Yes [provider]  metroNIDAZOLE (FLAGYL) 500 MG tablet Take 500 mg by mouth every 12 (twelve) hours. 08/28/17  Yes [provider]  ondansetron (ZOFRAN) 4 MG tablet Take 4 mg by mouth as needed. 08/28/17  Yes [provider]  polyethylene glycol (MIRALAX / GLYCOLAX) packet Take 17 g by mouth 2 (two) times daily. Takes at 3 pm and 7 pm   Yes [provider]  simvastatin (ZOCOR) 20 MG tablet Take 20 mg by mouth every morning.   Yes [provider]  zolpidem (AMBIEN) 10 MG tablet Take 10 mg by mouth daily as needed. 08/16/17  Yes [provider]    Physical Exam: Vitals:   09/01/17 0115 09/01/17 0130 09/01/17 0145 09/01/17 0200  BP: (!) 151/76 (!) 144/63    Pulse: 76 72 84 73  Resp: 17 12 13 14   Temp:      TempSrc:      SpO2: 97% 97% 98% 99%  Weight:      Height:        Constitutional: NAD, calm, comfortable Eyes: PERRL, lids and conjunctivae normal ENMT: Mucous membranes are moist. Posterior pharynx clear of any exudate or lesions.Normal dentition.  Neck: normal, supple, no masses, no thyromegaly Respiratory: clear to auscultation bilaterally, no wheezing, no crackles. Normal respiratory effort. No accessory muscle use.  Cardiovascular: Regular rate and rhythm, no murmurs / rubs / gallops. No extremity edema. 2+ pedal pulses. No carotid bruits.  Abdomen: no tenderness, no masses palpated. No hepatosplenomegaly. Bowel sounds positive.  Musculoskeletal: no clubbing / cyanosis. No joint deformity upper and lower extremities. Good ROM, no contractures. Normal muscle tone.  Skin: no rashes, lesions, ulcers. No induration Neurologic: CN 2-12 grossly intact. Sensation intact, DTR normal. Strength 5/5 in all 4.  Psychiatric: Normal judgment and insight. Alert and oriented x 3. Normal mood.    Labs on Admission: I  have personally reviewed following labs and imaging studies  CBC: Recent Labs  Lab 08/31/17 1517  WBC 6.5  NEUTROABS 4.0  HGB 13.7  HCT 40.2  MCV 91.2  PLT 546   Basic Metabolic Panel: Recent Labs  Lab 08/31/17 1517  NA 137  K 3.5  CL 104  CO2 24  GLUCOSE 118*  BUN 9  CREATININE 0.95  CALCIUM 9.3   GFR: Estimated Creatinine Clearance: 62.1 mL/min (by C-G formula based on SCr of 0.95 mg/dL). Liver Function Tests: Recent Labs  Lab 08/31/17 1517  AST 27  ALT 24  ALKPHOS 46  BILITOT 0.7  PROT 6.9  ALBUMIN 4.3   Recent Labs  Lab 08/31/17 1517  LIPASE 24   No results for input(s): AMMONIA in the last 168 hours. Coagulation Profile: No results for input(s):  INR, PROTIME in the last 168 hours. Cardiac Enzymes: No results for input(s): CKTOTAL, CKMB, CKMBINDEX, TROPONINI in the last 168 hours. BNP (last 3 results) No results for input(s): PROBNP in the last 8760 hours. HbA1C: No results for input(s): HGBA1C in the last 72 hours. CBG: No results for input(s): GLUCAP in the last 168 hours. Lipid Profile: No results for input(s): CHOL, HDL, LDLCALC, TRIG, CHOLHDL, LDLDIRECT in the last 72 hours. Thyroid Function Tests: No results for input(s): TSH, T4TOTAL, FREET4, T3FREE, THYROIDAB in the last 72 hours. Anemia Panel: No results for input(s): VITAMINB12, FOLATE, FERRITIN, TIBC, IRON, RETICCTPCT in the last 72 hours. Urine analysis:    Component Value Date/Time   COLORURINE YELLOW (A) 05/04/2017 1820   APPEARANCEUR Clear 06/09/2017 1318   LABSPEC 1.015 05/04/2017 1820   PHURINE 5.0 05/04/2017 1820   GLUCOSEU Negative 06/09/2017 1318   HGBUR NEGATIVE 05/04/2017 1820   BILIRUBINUR Negative 06/09/2017 1318   KETONESUR NEGATIVE 05/04/2017 1820   PROTEINUR Negative 06/09/2017 1318   PROTEINUR NEGATIVE 05/04/2017 1820   UROBILINOGEN 0.2 12/16/2014 1152   NITRITE Negative 06/09/2017 1318   NITRITE NEGATIVE 05/04/2017 1820   LEUKOCYTESUR 1+ (A) 06/09/2017 1318      Radiological Exams on Admission: Ct Abdomen Pelvis W Contrast  Result Date: 08/31/2017 CLINICAL DATA:  Abdominal pain and history of diverticulosis. Nausea. EXAM: CT ABDOMEN AND PELVIS WITH CONTRAST TECHNIQUE: Multidetector CT imaging of the abdomen and pelvis was performed using the standard protocol following bolus administration of intravenous contrast. CONTRAST:  100 mL ISOVUE-300 IOPAMIDOL (ISOVUE-300) INJECTION 61% COMPARISON:  CT AP 06/23/2017 FINDINGS: Lower chest: No pulmonary nodules or pleural effusion. No visible pericardial effusion. Hepatobiliary: Normal hepatic contours and density. No visible biliary dilatation. Focal fatty infiltration adjacent to the falciform ligament. Status post cholecystectomy. Pancreas: Normal contours without ductal dilatation. No peripancreatic fluid collection. Spleen: Normal. Adrenals/Urinary Tract: --Adrenal glands: Normal. --Right kidney/ureter: No hydronephrosis or perinephric stranding. No nephrolithiasis. No obstructing ureteral stones. --Left kidney/ureter: No hydronephrosis or perinephric stranding. No nephrolithiasis. No obstructing ureteral stones. --Urinary bladder: Unremarkable. Stomach/Bowel: --Stomach/Duodenum: No hiatal hernia or other gastric abnormality. Normal duodenal course and caliber. --Small bowel: No dilatation or inflammation. --Colon: There is rectosigmoid diverticulosis without evidence for acute inflammation. There is fluid throughout the colon. --Appendix: Surgically absent. Vascular/Lymphatic: Normal course and caliber of the major abdominal vessels. No abdominal or pelvic lymphadenopathy. Reproductive: Status post hysterectomy. No adnexal mass. Musculoskeletal. No bony spinal canal stenosis or focal osseous abnormality. Other: None. IMPRESSION: 1. No acute abnormality of the abdomen or pelvis. 2. Rectosigmoid diverticulosis without acute inflammation. 3. Fluid throughout the colon is in keeping with a diarrheal illness.  Electronically Signed   By: Ulyses Jarred M.D.   On: 08/31/2017 20:56    EKG: Independently reviewed.  Assessment/Plan Active Problems:   Chest pain, rule out acute myocardial infarction    1. CP r/o - 1. CP obs pathway 2. Serial trops 3. Tele monitor 4. Will go ahead and let patient eat 5. Consider cards eval in AM 2. LLQ abd pain, partially treated diverticulitis - 1. Continue home meds and ABx for now 2. No evidence of complication on CT.  DVT prophylaxis: Lovenox Code Status: Full Family Communication: No family in room Disposition Plan: Home after admit Consults called: None Admission status: Place in obs   Dawsyn Ramsaran, [facility] Pager [phone]  If 7AM-7PM, please contact day team taking care of patient [email or web address] Password [email or web address]  09/01/2017, 2:13 AM

## 2017-09-01 NOTE — Progress Notes (Addendum)
Went over discharge instructions- follow up appointments and medications.  Sent home with patient.  Removed IV.  Patient drove herself home.

## 2017-09-03 DIAGNOSIS — R195 Other fecal abnormalities: Secondary | ICD-10-CM | POA: Diagnosis not present

## 2017-09-03 DIAGNOSIS — R1032 Left lower quadrant pain: Secondary | ICD-10-CM | POA: Diagnosis not present

## 2017-09-03 DIAGNOSIS — R110 Nausea: Secondary | ICD-10-CM | POA: Diagnosis not present

## 2017-09-16 DIAGNOSIS — K224 Dyskinesia of esophagus: Secondary | ICD-10-CM | POA: Diagnosis not present

## 2017-09-16 DIAGNOSIS — K2930 Chronic superficial gastritis without bleeding: Secondary | ICD-10-CM | POA: Diagnosis not present

## 2017-09-16 DIAGNOSIS — K921 Melena: Secondary | ICD-10-CM | POA: Diagnosis not present

## 2017-09-16 DIAGNOSIS — K449 Diaphragmatic hernia without obstruction or gangrene: Secondary | ICD-10-CM | POA: Diagnosis not present

## 2017-09-16 DIAGNOSIS — B3781 Candidal esophagitis: Secondary | ICD-10-CM | POA: Diagnosis not present

## 2017-09-23 DIAGNOSIS — K2930 Chronic superficial gastritis without bleeding: Secondary | ICD-10-CM | POA: Diagnosis not present

## 2017-09-23 DIAGNOSIS — B3781 Candidal esophagitis: Secondary | ICD-10-CM | POA: Diagnosis not present

## 2017-09-28 DIAGNOSIS — I10 Essential (primary) hypertension: Secondary | ICD-10-CM | POA: Diagnosis not present

## 2017-09-28 DIAGNOSIS — Z6828 Body mass index (BMI) 28.0-28.9, adult: Secondary | ICD-10-CM | POA: Diagnosis not present

## 2017-10-01 DIAGNOSIS — Z6827 Body mass index (BMI) 27.0-27.9, adult: Secondary | ICD-10-CM | POA: Diagnosis not present

## 2017-10-01 DIAGNOSIS — I10 Essential (primary) hypertension: Secondary | ICD-10-CM | POA: Diagnosis not present

## 2017-10-08 DIAGNOSIS — R0602 Shortness of breath: Secondary | ICD-10-CM | POA: Diagnosis not present

## 2017-10-08 DIAGNOSIS — R05 Cough: Secondary | ICD-10-CM | POA: Diagnosis not present

## 2017-10-08 DIAGNOSIS — R079 Chest pain, unspecified: Secondary | ICD-10-CM | POA: Diagnosis not present

## 2017-10-11 DIAGNOSIS — B3781 Candidal esophagitis: Secondary | ICD-10-CM | POA: Diagnosis not present

## 2017-10-11 DIAGNOSIS — R1032 Left lower quadrant pain: Secondary | ICD-10-CM | POA: Diagnosis not present

## 2017-10-28 DIAGNOSIS — R0602 Shortness of breath: Secondary | ICD-10-CM | POA: Diagnosis not present

## 2017-12-02 DIAGNOSIS — Z6828 Body mass index (BMI) 28.0-28.9, adult: Secondary | ICD-10-CM | POA: Diagnosis not present

## 2017-12-02 DIAGNOSIS — I10 Essential (primary) hypertension: Secondary | ICD-10-CM | POA: Diagnosis not present

## 2017-12-07 ENCOUNTER — Ambulatory Visit: Payer: BLUE CROSS/BLUE SHIELD | Admitting: Urology

## 2017-12-07 ENCOUNTER — Encounter: Payer: Self-pay | Admitting: Urology

## 2017-12-07 VITALS — BP 131/64 | HR 66 | Ht 66.0 in | Wt 167.0 lb

## 2017-12-07 DIAGNOSIS — Z8551 Personal history of malignant neoplasm of bladder: Secondary | ICD-10-CM

## 2017-12-07 LAB — MICROSCOPIC EXAMINATION
Bacteria, UA: NONE SEEN
EPITHELIAL CELLS (NON RENAL): NONE SEEN /HPF (ref 0–10)
RBC MICROSCOPIC, UA: NONE SEEN /HPF (ref 0–?)

## 2017-12-07 LAB — URINALYSIS, COMPLETE
Bilirubin, UA: NEGATIVE
Glucose, UA: NEGATIVE
Ketones, UA: NEGATIVE
Nitrite, UA: NEGATIVE
PH UA: 7 (ref 5.0–7.5)
PROTEIN UA: NEGATIVE
RBC UA: NEGATIVE
Specific Gravity, UA: 1.015 (ref 1.005–1.030)
Urobilinogen, Ur: 0.2 mg/dL (ref 0.2–1.0)

## 2017-12-07 MED ORDER — DOXYCYCLINE HYCLATE 100 MG PO TABS
100.0000 mg | ORAL_TABLET | Freq: Once | ORAL | Status: AC
  Administered 2017-12-07: 100 mg via ORAL

## 2017-12-07 MED ORDER — LIDOCAINE HCL 2 % EX GEL
1.0000 "application " | Freq: Once | CUTANEOUS | Status: AC
  Administered 2017-12-07: 1 via URETHRAL

## 2017-12-07 NOTE — Progress Notes (Signed)
   12/07/17  CC:  Chief Complaint  Patient presents with  . Cysto    HPI: 67 year old female with a history of 1 cm low-grade Ta TCC status post TURBT on 02/2017.  She returns today for surveillance cystoscopy.  No gross hematuria or dysuria.  Blood pressure 123/73, pulse 85, height 5\' 6"  (1.676 m), weight 178 lb (80.7 kg). NED. A&Ox3.   No respiratory distress   Abd soft, NT, ND Normal external genitalia with patent urethral meatus  Cystoscopy Procedure Note  Patient identification was confirmed, informed consent was obtained, and patient was prepped using Betadine solution.  Lidocaine jelly was administered per urethral meatus.    Preoperative abx where received prior to procedure.    Procedure: - Flexible cystoscope introduced, without any difficulty.   - Thorough search of the bladder revealed:    normal urethral meatus    normal urothelium    no stones    no ulcers     no tumors    no urethral polyps    no trabeculation    Stellate scar on right posterior bladder wall  - Ureteral orifices were normal in position and appearance.  Post-Procedure: - Patient tolerated the procedure well  Assessment/ Plan:  1. History of bladder cancer No evidence of disease today on cystoscopy, reassuring  Return in about 6 months (around 06/09/2018) for cysto.  Hollice Espy, MD

## 2017-12-08 DIAGNOSIS — R079 Chest pain, unspecified: Secondary | ICD-10-CM | POA: Diagnosis not present

## 2017-12-08 DIAGNOSIS — R0602 Shortness of breath: Secondary | ICD-10-CM | POA: Diagnosis not present

## 2017-12-08 DIAGNOSIS — R05 Cough: Secondary | ICD-10-CM | POA: Diagnosis not present

## 2017-12-24 DIAGNOSIS — R079 Chest pain, unspecified: Secondary | ICD-10-CM | POA: Diagnosis not present

## 2017-12-24 DIAGNOSIS — R05 Cough: Secondary | ICD-10-CM | POA: Diagnosis not present

## 2017-12-24 DIAGNOSIS — R0602 Shortness of breath: Secondary | ICD-10-CM | POA: Diagnosis not present

## 2017-12-30 DIAGNOSIS — E7800 Pure hypercholesterolemia, unspecified: Secondary | ICD-10-CM | POA: Diagnosis not present

## 2017-12-30 DIAGNOSIS — I10 Essential (primary) hypertension: Secondary | ICD-10-CM | POA: Diagnosis not present

## 2017-12-30 DIAGNOSIS — G4700 Insomnia, unspecified: Secondary | ICD-10-CM | POA: Diagnosis not present

## 2017-12-30 DIAGNOSIS — Z6827 Body mass index (BMI) 27.0-27.9, adult: Secondary | ICD-10-CM | POA: Diagnosis not present

## 2018-01-05 DIAGNOSIS — B3781 Candidal esophagitis: Secondary | ICD-10-CM | POA: Diagnosis not present

## 2018-01-12 DIAGNOSIS — R0602 Shortness of breath: Secondary | ICD-10-CM | POA: Diagnosis not present

## 2018-01-12 DIAGNOSIS — F418 Other specified anxiety disorders: Secondary | ICD-10-CM | POA: Diagnosis not present

## 2018-01-12 DIAGNOSIS — R079 Chest pain, unspecified: Secondary | ICD-10-CM | POA: Diagnosis not present

## 2018-01-12 DIAGNOSIS — I10 Essential (primary) hypertension: Secondary | ICD-10-CM | POA: Diagnosis not present

## 2018-01-13 ENCOUNTER — Telehealth: Payer: Self-pay | Admitting: *Deleted

## 2018-01-13 DIAGNOSIS — I10 Essential (primary) hypertension: Secondary | ICD-10-CM

## 2018-01-13 DIAGNOSIS — R072 Precordial pain: Secondary | ICD-10-CM

## 2018-01-13 DIAGNOSIS — E785 Hyperlipidemia, unspecified: Secondary | ICD-10-CM

## 2018-01-13 NOTE — Telephone Encounter (Signed)
Pts PCP Dr. Ubaldo Glassing, has requested for this pt to have a coronary CT done for chest pain.  Order has been placed and ordered under our Imaging Cardiologist, Dr Johnsie Cancel.  Dr Johnsie Cancel to read this study.  [facility] to schedule this pts coronary CT.

## 2018-01-21 DIAGNOSIS — H02815 Retained foreign body in left lower eyelid: Secondary | ICD-10-CM | POA: Diagnosis not present

## 2018-01-23 ENCOUNTER — Emergency Department: Payer: BLUE CROSS/BLUE SHIELD

## 2018-01-23 ENCOUNTER — Encounter: Payer: Self-pay | Admitting: Emergency Medicine

## 2018-01-23 ENCOUNTER — Emergency Department
Admission: EM | Admit: 2018-01-23 | Discharge: 2018-01-23 | Disposition: A | Discharge status: Home / Self Care | Payer: BLUE CROSS/BLUE SHIELD | Attending: Emergency Medicine | Admitting: Emergency Medicine

## 2018-01-23 ENCOUNTER — Other Ambulatory Visit: Payer: Self-pay

## 2018-01-23 DIAGNOSIS — N189 Chronic kidney disease, unspecified: Secondary | ICD-10-CM | POA: Insufficient documentation

## 2018-01-23 DIAGNOSIS — R110 Nausea: Secondary | ICD-10-CM | POA: Insufficient documentation

## 2018-01-23 DIAGNOSIS — Z79899 Other long term (current) drug therapy: Secondary | ICD-10-CM | POA: Diagnosis not present

## 2018-01-23 DIAGNOSIS — I129 Hypertensive chronic kidney disease with stage 1 through stage 4 chronic kidney disease, or unspecified chronic kidney disease: Secondary | ICD-10-CM | POA: Insufficient documentation

## 2018-01-23 DIAGNOSIS — K5900 Constipation, unspecified: Secondary | ICD-10-CM | POA: Insufficient documentation

## 2018-01-23 DIAGNOSIS — R638 Other symptoms and signs concerning food and fluid intake: Secondary | ICD-10-CM | POA: Insufficient documentation

## 2018-01-23 DIAGNOSIS — R109 Unspecified abdominal pain: Secondary | ICD-10-CM | POA: Diagnosis not present

## 2018-01-23 DIAGNOSIS — R001 Bradycardia, unspecified: Secondary | ICD-10-CM | POA: Diagnosis not present

## 2018-01-23 LAB — LIPASE, BLOOD: LIPASE: 22 U/L (ref 11–51)

## 2018-01-23 LAB — URINALYSIS, COMPLETE (UACMP) WITH MICROSCOPIC
Bacteria, UA: NONE SEEN
Bilirubin Urine: NEGATIVE
Glucose, UA: NEGATIVE mg/dL
Hgb urine dipstick: NEGATIVE
KETONES UR: 20 mg/dL — AB
Leukocytes, UA: NEGATIVE
Nitrite: NEGATIVE
PROTEIN: NEGATIVE mg/dL
Specific Gravity, Urine: 1.011 (ref 1.005–1.030)
pH: 5 (ref 5.0–8.0)

## 2018-01-23 LAB — COMPREHENSIVE METABOLIC PANEL
ALBUMIN: 4.7 g/dL (ref 3.5–5.0)
ALT: 14 U/L (ref 14–54)
AST: 18 U/L (ref 15–41)
Alkaline Phosphatase: 43 U/L (ref 38–126)
Anion gap: 9 (ref 5–15)
BUN: 10 mg/dL (ref 6–20)
CHLORIDE: 99 mmol/L — AB (ref 101–111)
CO2: 27 mmol/L (ref 22–32)
CREATININE: 0.75 mg/dL (ref 0.44–1.00)
Calcium: 9.1 mg/dL (ref 8.9–10.3)
GFR calc non Af Amer: >60 mL/min (ref >60)
GLUCOSE: 104 mg/dL — AB (ref 65–99)
Potassium: 3.3 mmol/L — ABNORMAL LOW (ref 3.5–5.1)
SODIUM: 135 mmol/L (ref 135–145)
Total Bilirubin: 2 mg/dL — ABNORMAL HIGH (ref 0.3–1.2)
Total Protein: 7.3 g/dL (ref 6.5–8.1)

## 2018-01-23 LAB — CBC
HCT: 41.4 % (ref 35.0–47.0)
Hemoglobin: 14.6 g/dL (ref 12.0–16.0)
MCH: 32.5 pg (ref 26.0–34.0)
MCHC: 35.4 g/dL (ref 32.0–36.0)
MCV: 91.9 fL (ref 80.0–100.0)
PLATELETS: 206 10*3/uL (ref 150–440)
RBC: 4.5 MIL/uL (ref 3.80–5.20)
RDW: 12.7 % (ref 11.5–14.5)
WBC: 6 10*3/uL (ref 3.6–11.0)

## 2018-01-23 MED ORDER — IOPAMIDOL (ISOVUE-370) INJECTION 76%
75.0000 mL | Freq: Once | INTRAVENOUS | Status: AC | PRN
  Administered 2018-01-23: 75 mL via INTRAVENOUS
  Filled 2018-01-23: qty 75

## 2018-01-23 MED ORDER — SODIUM CHLORIDE 0.9 % IV BOLUS
1000.0000 mL | INTRAVENOUS | Status: AC
Start: 2018-01-23 — End: 2018-01-23
  Administered 2018-01-23: 1000 mL via INTRAVENOUS

## 2018-01-23 MED ORDER — IOPAMIDOL (ISOVUE-300) INJECTION 61%
30.0000 mL | Freq: Once | INTRAVENOUS | Status: AC | PRN
  Administered 2018-01-23: 30 mL via ORAL
  Filled 2018-01-23: qty 30

## 2018-01-23 NOTE — ED Triage Notes (Signed)
Pt arrived via POV with reports of abdominal pain and constipation. Pt has hx of constipation.  Pt states on Friday she started having RUQ  Radiating to the back with nausea and vomiting.    Pt states she has not had BM since Friday despite taking large amounts of Miralax and increased water intake.   Pt does not have gallbladder.  Pt sees GI already.

## 2018-01-23 NOTE — Discharge Instructions (Signed)
You were seen in the emergency department today for constipation.  Fortunately your medical work-up was reassuring and your CT scan with oral and IV contrast did not demonstrate any acute or emergent conditions, just the diverticulosis that you already knew you had.  We recommend that you use one or more of the following over-the-counter medications in the order described in addition to your current MiraLAX:   1)  Colace (or Dulcolax) 100 mg:  This is a stool softener, and you may take it once or twice a day as needed. 2)  Senna tablets:  This is a bowel stimulant that will help "push" out your stool. It is the next step to add after you have tried a stool softener. 3)  Look for magnesium citrate at the pharmacy (it is usually a small glass bottle).  Drink the bottle according to the label instructions.  Remember that narcotic pain medications are constipating, so avoid them or minimize their use.  Drink plenty of fluids.  Please return to the Emergency Department immediately if you develop new or worsening symptoms that concern you, such as (but not limited to) fever > 101 degrees, severe abdominal pain, or persistent vomiting.

## 2018-01-23 NOTE — ED Provider Notes (Signed)
[facility] Emergency Department Provider Note  ____________________________________________   First MD Initiated Contact with Patient 01/23/18 1844     (approximate)  I have reviewed the triage vital signs and the nursing notes.   HISTORY  Chief Complaint Abdominal Pain    HPI Gloria Bell is a 67 y.o. 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  She presents today with gradually worsening abdominal pain and no bowel movements for 2 days.  She has been taking MiraLAX in the morning as usual and typically that causes her to have a liquid bowel movement, but it is not worked up for the last 2 days.  She has had nausea but no vomiting.  She has had decreased appetite.  No abdominal distention, some abdominal cramping.  She is passed a small amount of gas from below but not a large volume.  Nothing particular makes her better or worse.  She denies chest pain, shortness of breath, fever/chills.  She has never had surgery on her abdomen but she has had extensive orthopedic surgery.  Past Medical History:  Diagnosis Date  . Arthritis   . Chronic kidney disease    Bladder Tumor  . Diverticulitis   . Diverticulosis   . Fall from ladder 09/2003  . History of kidney stones 1987  . Hyperlipidemia   . Hypertension   . Osteoarthritis   . Osteoporosis     Patient Active Problem List   Diagnosis Date Noted  . Chest pain, rule out acute myocardial infarction 09/01/2017  . HLD (hyperlipidemia) 05/04/2017  . Rectal bleed 05/04/2017  . Colovesical fistula 05/04/2017  . GI bleed 05/18/2015  . Bleeding internal hemorrhoids 01/25/2013  . Abdominal pain, acute, left lower quadrant 04/18/2012  . Intra-abdominal abscess ([place]) 01/06/2012  . Diverticulitis 01/05/2012  . HTN (hypertension) 01/05/2012    Past Surgical History:    Procedure Laterality Date  . ABDOMINAL HYSTERECTOMY    . APPENDECTOMY    . CHOLECYSTECTOMY  1992  . CYSTOSCOPY W/ RETROGRADES Bilateral 03/01/2017   Procedure: CYSTOSCOPY WITH RETROGRADE PYELOGRAM;  Surgeon: Hollice Espy, MD;  Location: [facility] ORS;  Service: Urology;  Laterality: Bilateral;  . ELBOW FRACTURE SURGERY Right   . FRACTURE SURGERY    . KNEE SURGERY Bilateral   . OTHER SURGICAL HISTORY     had to be on percocet for a year and it slowed down her "gut"   . TRANSURETHRAL RESECTION OF BLADDER TUMOR WITH MITOMYCIN-C N/A 03/01/2017   Procedure: TRANSURETHRAL RESECTION OF BLADDER TUMOR WITH MITOMYCIN-C;  Surgeon: Hollice Espy, MD;  Location: [facility] ORS;  Service: Urology;  Laterality: N/A;    Prior to Admission medications   Medication Sig Start Date End Date Taking? Authorizing Provider  acetaminophen (TYLENOL 8 HOUR ARTHRITIS PAIN) 650 MG CR tablet Take 1,300 mg by mouth every 8 (eight) hours as needed for pain.    [provider]  HYDROcodone-acetaminophen (NORCO/VICODIN) 5-325 MG tablet Take 1 tablet by mouth as needed. 08/28/17   [provider]  losartan (COZAAR) 50 MG tablet Take 50 mg by mouth 2 (two) times daily.     [provider]  metoprolol succinate (TOPROL-XL) 50 MG 24 hr tablet Take 50 mg by mouth daily. Take with or immediately following a meal.    [provider]  ondansetron (ZOFRAN) 4 MG tablet Take  1 tablet (4 mg total) by mouth as needed. 09/01/17   Geradine Girt, DO  polyethylene glycol (MIRALAX / GLYCOLAX) packet Take 17 g by mouth 2 (two) times daily. Takes at 3 pm and 7 pm    [provider]  simvastatin (ZOCOR) 20 MG tablet Take 20 mg by mouth every morning.    [provider]  zolpidem (AMBIEN) 10 MG tablet Take 10 mg by mouth daily as needed. 08/16/17   [provider]    Allergies Amoxicillin; Cephalexin; Hydrochlorothiazide; Lisinopril; Oxycodone-acetaminophen; Sulfa antibiotics;  Ampicillin; Ciprofloxacin; Clarithromycin; Codeine; Hydrocodone-acetaminophen; and Metronidazole  Family History  Problem Relation Age of Onset  . Heart disease Father        heart attack  . Diabetes Father   . Cancer Paternal Grandfather        prostate    Social History Social History   Tobacco Use  . Smoking status: Never Smoker  . Smokeless tobacco: Never Used  Substance Use Topics  . Alcohol use: No  . Drug use: No    Review of Systems Constitutional: No fever/chills Eyes: No visual changes. ENT: No sore throat. Cardiovascular: Denies chest pain. Respiratory: Denies shortness of breath. Gastrointestinal: Abdominal pain and constipation x2 days as described above Genitourinary: Negative for dysuria. Musculoskeletal: Negative for neck pain.  Negative for back pain. Integumentary: Negative for rash. Neurological: Negative for headaches, focal weakness or numbness.   ____________________________________________   PHYSICAL EXAM:  VITAL SIGNS: ED Triage Vitals  Enc Vitals Group     BP 01/23/18 1554 (!) 165/75     Pulse Rate 01/23/18 1554 60     Resp 01/23/18 1554 18     Temp 01/23/18 1554 98.4 F (36.9 C)     Temp Source 01/23/18 1554 Oral     SpO2 01/23/18 1554 98 %     Weight 01/23/18 1554 73 kg (161 lb)     Height 01/23/18 1554 1.676 m (5\' 6" )     Head Circumference --      Peak Flow --      Pain Score 01/23/18 1613 10     Pain Loc --      Pain Edu? --      Excl. in [place]? --     Constitutional: Alert and oriented. Well appearing and in no acute distress. Eyes: Conjunctivae are normal.  Head: Atraumatic. ose: No congestion/rhinnorhea. Mouth/Throat: Mucous membranes are moist. Neck: No stridor.  No meningeal signs.   Cardiovascular: Normal rate, regular rhythm. Good peripheral circulation. Grossly normal heart sounds. Respiratory: Normal respiratory effort.  No retractions. Lungs CTAB. Gastrointestinal: Soft and nontender. No distention.    Musculoskeletal: No lower extremity tenderness nor edema. No gross deformities of extremities. Neurologic:  Normal speech and language. No gross focal neurologic deficits are appreciated.  Skin:  Skin is warm, dry and intact. No rash noted. Psychiatric: Mood and affect are normal. Speech and behavior are normal.  ____________________________________________   LABS (all labs ordered are listed, but only abnormal results are displayed)  Labs Reviewed  COMPREHENSIVE METABOLIC PANEL - Abnormal; Notable for the following components:      Result Value   Potassium 3.3 (*)    Chloride 99 (*)    Glucose, Bld 104 (*)    Total Bilirubin 2.0 (*)    All other components within normal limits  URINALYSIS, COMPLETE (UACMP) WITH MICROSCOPIC - Abnormal; Notable for the following components:   Color, Urine YELLOW (*)    APPearance CLEAR (*)  Ketones, ur 20 (*)    All other components within normal limits  LIPASE, BLOOD  CBC   ____________________________________________  EKG  ED ECG REPORT I, Hinda Kehr, the attending physician, personally viewed and interpreted this ECG.  Date: 01/23/2018 EKG Time: 16: 28 Rate: 57 Rhythm: Mild sinus bradycardia QRS Axis: normal Intervals: normal ST/T Wave abnormalities: Non-specific ST segment / T-wave changes, but no evidence of acute ischemia. Narrative Interpretation: no evidence of acute ischemia  ____________________________________________  RADIOLOGY  ED MD interpretation: Chronic diverticulosis with no evidence of acute or emergent condition  Official radiology report(s): Ct Abdomen Pelvis W Contrast  Result Date: 01/23/2018 CLINICAL DATA:  Acute generalized abdominal pain. EXAM: CT ABDOMEN AND PELVIS WITH CONTRAST TECHNIQUE: Multidetector CT imaging of the abdomen and pelvis was performed using the standard protocol following bolus administration of intravenous contrast. CONTRAST:  22mL ISOVUE-370 IOPAMIDOL (ISOVUE-370) INJECTION 76%  COMPARISON:  CT scan of August 31, 2017. FINDINGS: Lower chest: No acute abnormality. Hepatobiliary: No focal liver abnormality is seen. Status post cholecystectomy. No biliary dilatation. Pancreas: Unremarkable. No pancreatic ductal dilatation or surrounding inflammatory changes. Spleen: Normal in size without focal abnormality. Adrenals/Urinary Tract: Adrenal glands are unremarkable. Kidneys are normal, without renal calculi, focal lesion, or hydronephrosis. Bladder is unremarkable. Stomach/Bowel: The stomach appears normal. There is no evidence of bowel obstruction or inflammation. Status post appendectomy. Sigmoid diverticulosis is noted without inflammation. Vascular/Lymphatic: No significant vascular findings are present. No enlarged abdominal or pelvic lymph nodes. Reproductive: Status post hysterectomy. No adnexal masses. Other: No abdominal wall hernia or abnormality. No abdominopelvic ascites. Musculoskeletal: No acute or significant osseous findings. IMPRESSION: Sigmoid diverticulosis without inflammation. No acute abnormality seen in the abdomen or pelvis. Electronically Signed   By: Marijo Conception, M.D.   On: 01/23/2018 21:26    ____________________________________________   PROCEDURES  Critical Care performed: No   Procedure(s) performed:   Procedures   ____________________________________________   INITIAL IMPRESSION / ASSESSMENT AND PLAN / ED COURSE  As part of my medical decision making, I reviewed the following data within the [place] notes reviewed and incorporated, Labs reviewed , EKG interpreted , Old chart reviewed and Notes from prior ED visits    Differential diagnosis includes, but is not limited to, SBO/ileus, gastroparesis, neoplasm, appendicitis, biliary colic.  The patient is well-appearing and in no acute distress with normal vital signs except for hypertension which she says is normal.  Given her history of chronic constipation I  suspect this may reflect an ileus.  She has mild hypokalemia which could also contribute to constipation.  She has 20 ketones in her urine I am giving 1 L normal saline IV bolus.  Lipase is normal and metabolic panel is otherwise unremarkable except for a slightly elevated total bilirubin of unknown significance.  I will obtain a CT scan of the abdomen and pelvis with oral and IV contrast for the best possible imaging.  She understands and agrees with the plan.  Clinical Course as of Jan 24 2219  Nancy Fetter Jan 23, 2018  2217 The patient has been resting comfortably.  She continues to have no tenderness but still reports not having had a bowel movement.  I provided her with the good news that her CT scan was reassuring, demonstrating her known diverticulosis but no evidence of any acute or emergent condition.  I told her I would give her information about additional laxatives and stimulants she can take but I encouraged her to call her gastroenterologist  first thing in the morning to schedule follow-up appointment.  She states that she saw him just 2 weeks ago and will definitely call tomorrow.  I gave my usual and customary return precautions.    [CF]    Clinical Course User Index [CF] Hinda Kehr, MD    ____________________________________________  FINAL CLINICAL IMPRESSION(S) / ED DIAGNOSES  Final diagnoses:  Constipation, unspecified constipation type     MEDICATIONS GIVEN DURING THIS VISIT:  Medications  sodium chloride 0.9 % bolus 1,000 mL (0 mLs Intravenous Stopped 01/23/18 2148)  iopamidol (ISOVUE-300) 61 % injection 30 mL (30 mLs Oral Contrast Given 01/23/18 1912)  iopamidol (ISOVUE-370) 76 % injection 75 mL (75 mLs Intravenous Contrast Given 01/23/18 2032)     ED Discharge Orders    None       Note:  This document was prepared using Dragon voice recognition software and may include unintentional dictation errors.    Hinda Kehr, MD 01/23/18 2220

## 2018-01-30 ENCOUNTER — Encounter: Payer: Self-pay | Admitting: Emergency Medicine

## 2018-01-30 ENCOUNTER — Emergency Department
Admission: EM | Admit: 2018-01-30 | Discharge: 2018-01-30 | Disposition: A | Discharge status: Home / Self Care | Payer: BLUE CROSS/BLUE SHIELD | Attending: Emergency Medicine | Admitting: Emergency Medicine

## 2018-01-30 ENCOUNTER — Emergency Department: Payer: BLUE CROSS/BLUE SHIELD

## 2018-01-30 ENCOUNTER — Other Ambulatory Visit: Payer: Self-pay

## 2018-01-30 DIAGNOSIS — N189 Chronic kidney disease, unspecified: Secondary | ICD-10-CM | POA: Insufficient documentation

## 2018-01-30 DIAGNOSIS — R0789 Other chest pain: Secondary | ICD-10-CM | POA: Diagnosis not present

## 2018-01-30 DIAGNOSIS — F419 Anxiety disorder, unspecified: Secondary | ICD-10-CM | POA: Insufficient documentation

## 2018-01-30 DIAGNOSIS — R079 Chest pain, unspecified: Secondary | ICD-10-CM

## 2018-01-30 DIAGNOSIS — Z79899 Other long term (current) drug therapy: Secondary | ICD-10-CM | POA: Diagnosis not present

## 2018-01-30 DIAGNOSIS — Z9049 Acquired absence of other specified parts of digestive tract: Secondary | ICD-10-CM | POA: Insufficient documentation

## 2018-01-30 DIAGNOSIS — I10 Essential (primary) hypertension: Secondary | ICD-10-CM

## 2018-01-30 DIAGNOSIS — I129 Hypertensive chronic kidney disease with stage 1 through stage 4 chronic kidney disease, or unspecified chronic kidney disease: Secondary | ICD-10-CM | POA: Insufficient documentation

## 2018-01-30 LAB — CBC
HEMATOCRIT: 40.8 % (ref 35.0–47.0)
Hemoglobin: 14.5 g/dL (ref 12.0–16.0)
MCH: 32.6 pg (ref 26.0–34.0)
MCHC: 35.4 g/dL (ref 32.0–36.0)
MCV: 91.9 fL (ref 80.0–100.0)
Platelets: 207 10*3/uL (ref 150–440)
RBC: 4.44 MIL/uL (ref 3.80–5.20)
RDW: 13 % (ref 11.5–14.5)
WBC: 5.5 10*3/uL (ref 3.6–11.0)

## 2018-01-30 LAB — BASIC METABOLIC PANEL
Anion gap: 9 (ref 5–15)
BUN: 13 mg/dL (ref 6–20)
CHLORIDE: 103 mmol/L (ref 101–111)
CO2: 25 mmol/L (ref 22–32)
Calcium: 9.4 mg/dL (ref 8.9–10.3)
Creatinine, Ser: 0.67 mg/dL (ref 0.44–1.00)
GFR calc non Af Amer: >60 mL/min (ref >60)
Glucose, Bld: 106 mg/dL — ABNORMAL HIGH (ref 65–99)
POTASSIUM: 3.2 mmol/L — AB (ref 3.5–5.1)
SODIUM: 137 mmol/L (ref 135–145)

## 2018-01-30 LAB — TROPONIN I: Troponin I: <0.03 ng/mL (ref <0.03)

## 2018-01-30 MED ORDER — POTASSIUM CHLORIDE CRYS ER 20 MEQ PO TBCR
40.0000 meq | EXTENDED_RELEASE_TABLET | Freq: Once | ORAL | Status: AC
  Administered 2018-01-30: 40 meq via ORAL
  Filled 2018-01-30: qty 2

## 2018-01-30 MED ORDER — ACETAMINOPHEN 325 MG PO TABS
650.0000 mg | ORAL_TABLET | Freq: Once | ORAL | Status: AC
  Administered 2018-01-30: 650 mg via ORAL
  Filled 2018-01-30: qty 2

## 2018-01-30 NOTE — ED Triage Notes (Signed)
Patient presents to the ED for hypertension and chest pain.  Patient states she sees Dr. Ubaldo Glassing and he told her to take a log of her blood pressures.  Patient brought a piece of paper with the last several day's blood pressure and they have been somewhat erratic.  Patient states she began to have left sided stabbing chest pain radiating into her shoulder and breast.

## 2018-01-30 NOTE — ED Provider Notes (Signed)
[facility] Emergency Department Provider Note  _____________________________________   I have reviewed the triage vital signs and the nursing notes.   HISTORY  Chief Complaint High blood pressure   History limited by: Not Limited   HPI Gloria Bell Wilfred Curtis is a 67 y.o. female who presents to the emergency department today because of primary concern for high blood pressure. Patient states that it has been going on since January. Is currently working with Dr. Ubaldo Glassing with cardiology to get the blood pressure under control. She has been taking her blood pressure multiple times a day. Today also had some left sided chest pain. She describes it as sharp. Is worse with movement. Started at 6 am and has been intermittent. Does not recall having chest pain like this before.   Per medical record review patient has a history of visits to her cardiologist this year for high blood pressure and chest pain.   Past Medical History:  Diagnosis Date  . Arthritis   . Chronic kidney disease    Bladder Tumor  . Diverticulitis   . Diverticulosis   . Fall from ladder 09/2003  . History of kidney stones 1987  . Hyperlipidemia   . Hypertension   . Osteoarthritis   . Osteoporosis     Patient Active Problem List   Diagnosis Date Noted  . Chest pain, rule out acute myocardial infarction 09/01/2017  . HLD (hyperlipidemia) 05/04/2017  . Rectal bleed 05/04/2017  . Colovesical fistula 05/04/2017  . GI bleed 05/18/2015  . Bleeding internal hemorrhoids 01/25/2013  . Abdominal pain, acute, left lower quadrant 04/18/2012  . Intra-abdominal abscess ([place]) 01/06/2012  . Diverticulitis 01/05/2012  . HTN (hypertension) 01/05/2012    Past Surgical History:  Procedure Laterality Date  . ABDOMINAL HYSTERECTOMY    . APPENDECTOMY    . CHOLECYSTECTOMY  1992  . CYSTOSCOPY W/ RETROGRADES Bilateral 03/01/2017   Procedure: CYSTOSCOPY WITH RETROGRADE PYELOGRAM;  Surgeon: Hollice Espy, MD;   Location: [facility] ORS;  Service: Urology;  Laterality: Bilateral;  . ELBOW FRACTURE SURGERY Right   . FRACTURE SURGERY    . KNEE SURGERY Bilateral   . OTHER SURGICAL HISTORY     had to be on percocet for a year and it slowed down her "gut"   . TRANSURETHRAL RESECTION OF BLADDER TUMOR WITH MITOMYCIN-C N/A 03/01/2017   Procedure: TRANSURETHRAL RESECTION OF BLADDER TUMOR WITH MITOMYCIN-C;  Surgeon: Hollice Espy, MD;  Location: [facility] ORS;  Service: Urology;  Laterality: N/A;    Prior to Admission medications   Medication Sig Start Date End Date Taking? Authorizing Provider  acetaminophen (TYLENOL 8 HOUR ARTHRITIS PAIN) 650 MG CR tablet Take 1,300 mg by mouth every 8 (eight) hours as needed for pain.    [provider]  HYDROcodone-acetaminophen (NORCO/VICODIN) 5-325 MG tablet Take 1 tablet by mouth as needed. 08/28/17   [provider]  losartan (COZAAR) 50 MG tablet Take 50 mg by mouth 2 (two) times daily.     [provider]  metoprolol succinate (TOPROL-XL) 50 MG 24 hr tablet Take 50 mg by mouth daily. Take with or immediately following a meal.    [provider]  ondansetron (ZOFRAN) 4 MG tablet Take 1 tablet (4 mg total) by mouth as needed. 09/01/17   Geradine Girt, DO  polyethylene glycol (MIRALAX / GLYCOLAX) packet Take 17 g by mouth 2 (two) times daily. Takes at 3 pm and 7 pm    [provider]  simvastatin (ZOCOR) 20 MG  tablet Take 20 mg by mouth every morning.    [provider]  zolpidem (AMBIEN) 10 MG tablet Take 10 mg by mouth daily as needed. 08/16/17   [provider]    Allergies Amoxicillin; Cephalexin; Hydrochlorothiazide; Lisinopril; Oxycodone-acetaminophen; Sulfa antibiotics; Ampicillin; Ciprofloxacin; Clarithromycin; Codeine; Hydrocodone-acetaminophen; and Metronidazole  Family History  Problem Relation Age of Onset  . Heart disease Father        heart attack  . Diabetes Father   . Cancer Paternal Grandfather         prostate    Social History Social History   Tobacco Use  . Smoking status: Never Smoker  . Smokeless tobacco: Never Used  Substance Use Topics  . Alcohol use: No  . Drug use: No    Review of Systems Constitutional: No fever/chills Eyes: No visual changes. ENT: No sore throat. Cardiovascular: Positive for sharp chest pain. Respiratory: Denies shortness of breath. Gastrointestinal: No abdominal pain.  No nausea, no vomiting.  No diarrhea.   Genitourinary: Negative for dysuria. Musculoskeletal: Negative for back pain. Skin: Negative for rash. Neurological: Negative for headaches, focal weakness or numbness.  ____________________________________________   PHYSICAL EXAM:  VITAL SIGNS: ED Triage Vitals  Enc Vitals Group     BP 01/30/18 1620 (!) 146/81     Pulse Rate 01/30/18 1620 60     Resp 01/30/18 1620 19     Temp 01/30/18 1620 98.6 F (37 C)     Temp Source 01/30/18 1620 Oral     SpO2 01/30/18 1620 96 %     Weight 01/30/18 1621 160 lb (72.6 kg)     Height 01/30/18 1621 5\' 6"  (1.676 m)     Head Circumference --      Peak Flow --      Pain Score 01/30/18 1627 8   Constitutional: Alert and oriented. Anxious. Eyes: Conjunctivae are normal.  ENT   Head: Normocephalic and atraumatic.   Nose: No congestion/rhinnorhea.   Mouth/Throat: Mucous membranes are moist.   Neck: No stridor. Hematological/Lymphatic/Immunilogical: No cervical lymphadenopathy. Cardiovascular: Normal rate, regular rhythm.  No murmurs, rubs, or gallops.  Respiratory: Normal respiratory effort without tachypnea nor retractions. Breath sounds are clear and equal bilaterally. No wheezes/rales/rhonchi. Gastrointestinal: Soft and non tender. No rebound. No guarding.  Genitourinary: Deferred Musculoskeletal: Normal range of motion in all extremities. No lower extremity edema. Neurologic:  Normal speech and language. No gross focal neurologic deficits are appreciated.  Skin:  Skin is  warm, dry and intact. No rash noted. Psychiatric: Anxious.  ____________________________________________    LABS (pertinent positives/negatives)  Trop <0.03 CBC wnl BMP na 137, k 3.2, glu 106  ____________________________________________   EKG  I, Nance Pear, attending physician, personally viewed and interpreted this EKG  EKG Time: 1617 Rate: 62 Rhythm: normal sinus rhythm Axis: normal Intervals: qtc 446 QRS: narrow ST changes: no st elevation Impression: normal ekg   ____________________________________________    RADIOLOGY  CXR No acute abnormality  ____________________________________________   PROCEDURES  Procedures  ____________________________________________   INITIAL IMPRESSION / ASSESSMENT AND PLAN / ED COURSE  Pertinent labs & imaging results that were available during my care of the patient were reviewed by me and considered in my medical decision making (see chart for details).  Patient presented to the emergency department today because of concerns for high blood pressure and chest pain.  Patient does appear quite anxious on my exam.  I tried to ask to her about the blood pressure and see exactly what her  concern is.  She is currently working with her cardiologist to gain better control.  She could not really verbalize exactly what her concern is with her high blood pressure.  Did try to reassure her that she can continue to work with her cardiologist.  In terms of the chest pain troponin was negative.  Given the fact that this started almost 12 hours ago I would expect some elevation if it is truly ACS.  Given the sharp somewhat pleuritic nature think is more likely inflammation.  I doubt PE given lack of secondary findings.  Will plan on discharging. Discussed findings with patient.   ____________________________________________   FINAL CLINICAL IMPRESSION(S) / ED DIAGNOSES  Final diagnoses:  Hypertension, unspecified type  Nonspecific  chest pain  Anxiety     Note: This dictation was prepared with Dragon dictation. Any transcriptional errors that result from this process are unintentional     Nance Pear, MD 01/30/18 [phone]

## 2018-01-30 NOTE — Discharge Instructions (Addendum)
Please seek medical attention for any high fevers, chest pain, shortness of breath, change in behavior, persistent vomiting, bloody stool or any other new or concerning symptoms.  

## 2018-01-30 NOTE — ED Notes (Signed)
Pt reports having a nagging chest pain that starts on the left part of her chest and radiates into her left upper arm and up her neck and face. Pt is alert and oriented x 4. Dr. Archie Balboa at the bedside.

## 2018-01-31 DIAGNOSIS — N6321 Unspecified lump in the left breast, upper outer quadrant: Secondary | ICD-10-CM | POA: Diagnosis not present

## 2018-01-31 DIAGNOSIS — Z6827 Body mass index (BMI) 27.0-27.9, adult: Secondary | ICD-10-CM | POA: Diagnosis not present

## 2018-01-31 DIAGNOSIS — N644 Mastodynia: Secondary | ICD-10-CM | POA: Diagnosis not present

## 2018-02-07 ENCOUNTER — Ambulatory Visit (HOSPITAL_COMMUNITY): Payer: BLUE CROSS/BLUE SHIELD

## 2018-02-07 ENCOUNTER — Ambulatory Visit (HOSPITAL_COMMUNITY)
Admission: RE | Admit: 2018-02-07 | Discharge: 2018-02-07 | Disposition: A | Discharge status: Home / Self Care | Payer: BLUE CROSS/BLUE SHIELD | Source: Ambulatory Visit | Attending: Cardiovascular Disease | Admitting: Cardiovascular Disease

## 2018-02-07 DIAGNOSIS — R079 Chest pain, unspecified: Secondary | ICD-10-CM | POA: Diagnosis not present

## 2018-02-07 DIAGNOSIS — I10 Essential (primary) hypertension: Secondary | ICD-10-CM | POA: Insufficient documentation

## 2018-02-07 DIAGNOSIS — R072 Precordial pain: Secondary | ICD-10-CM | POA: Insufficient documentation

## 2018-02-07 DIAGNOSIS — E785 Hyperlipidemia, unspecified: Secondary | ICD-10-CM | POA: Diagnosis not present

## 2018-02-07 DIAGNOSIS — I2510 Atherosclerotic heart disease of native coronary artery without angina pectoris: Secondary | ICD-10-CM | POA: Diagnosis not present

## 2018-02-07 MED ORDER — NITROGLYCERIN 0.4 MG SL SUBL
SUBLINGUAL_TABLET | SUBLINGUAL | Status: AC
Start: 2018-02-07 — End: 2018-02-07
  Administered 2018-02-07: 0.8 mg
  Filled 2018-02-07: qty 2

## 2018-02-07 MED ORDER — IOPAMIDOL (ISOVUE-370) INJECTION 76%
80.0000 mL | Freq: Once | INTRAVENOUS | Status: AC | PRN
  Administered 2018-02-07: 80 mL via INTRAVENOUS

## 2018-02-07 MED ORDER — IOPAMIDOL (ISOVUE-370) INJECTION 76%
INTRAVENOUS | Status: AC
  Filled 2018-02-07: qty 100

## 2018-02-15 DIAGNOSIS — N6321 Unspecified lump in the left breast, upper outer quadrant: Secondary | ICD-10-CM | POA: Diagnosis not present

## 2018-02-15 DIAGNOSIS — R928 Other abnormal and inconclusive findings on diagnostic imaging of breast: Secondary | ICD-10-CM | POA: Diagnosis not present

## 2018-02-22 DIAGNOSIS — R110 Nausea: Secondary | ICD-10-CM | POA: Diagnosis not present

## 2018-02-22 DIAGNOSIS — Z880 Allergy status to penicillin: Secondary | ICD-10-CM | POA: Diagnosis not present

## 2018-02-22 DIAGNOSIS — K5790 Diverticulosis of intestine, part unspecified, without perforation or abscess without bleeding: Secondary | ICD-10-CM | POA: Diagnosis not present

## 2018-02-22 DIAGNOSIS — C679 Malignant neoplasm of bladder, unspecified: Secondary | ICD-10-CM | POA: Diagnosis not present

## 2018-02-22 DIAGNOSIS — Z885 Allergy status to narcotic agent status: Secondary | ICD-10-CM | POA: Diagnosis not present

## 2018-02-22 DIAGNOSIS — Z6826 Body mass index (BMI) 26.0-26.9, adult: Secondary | ICD-10-CM | POA: Diagnosis not present

## 2018-02-22 DIAGNOSIS — K5730 Diverticulosis of large intestine without perforation or abscess without bleeding: Secondary | ICD-10-CM | POA: Diagnosis not present

## 2018-02-22 DIAGNOSIS — R1032 Left lower quadrant pain: Secondary | ICD-10-CM | POA: Diagnosis not present

## 2018-02-24 DIAGNOSIS — H02815 Retained foreign body in left lower eyelid: Secondary | ICD-10-CM | POA: Diagnosis not present

## 2018-03-07 DIAGNOSIS — H1132 Conjunctival hemorrhage, left eye: Secondary | ICD-10-CM | POA: Diagnosis not present

## 2018-03-08 DIAGNOSIS — M9902 Segmental and somatic dysfunction of thoracic region: Secondary | ICD-10-CM | POA: Diagnosis not present

## 2018-03-08 DIAGNOSIS — M546 Pain in thoracic spine: Secondary | ICD-10-CM | POA: Diagnosis not present

## 2018-03-08 DIAGNOSIS — M531 Cervicobrachial syndrome: Secondary | ICD-10-CM | POA: Diagnosis not present

## 2018-03-08 DIAGNOSIS — M5415 Radiculopathy, thoracolumbar region: Secondary | ICD-10-CM | POA: Diagnosis not present

## 2018-03-08 DIAGNOSIS — M542 Cervicalgia: Secondary | ICD-10-CM | POA: Diagnosis not present

## 2018-03-08 DIAGNOSIS — M9901 Segmental and somatic dysfunction of cervical region: Secondary | ICD-10-CM | POA: Diagnosis not present

## 2018-03-28 DIAGNOSIS — F418 Other specified anxiety disorders: Secondary | ICD-10-CM | POA: Diagnosis not present

## 2018-03-28 DIAGNOSIS — I10 Essential (primary) hypertension: Secondary | ICD-10-CM | POA: Diagnosis not present

## 2018-03-31 ENCOUNTER — Other Ambulatory Visit: Payer: Self-pay | Admitting: Gastroenterology

## 2018-03-31 DIAGNOSIS — K5900 Constipation, unspecified: Secondary | ICD-10-CM | POA: Diagnosis not present

## 2018-03-31 DIAGNOSIS — E049 Nontoxic goiter, unspecified: Secondary | ICD-10-CM | POA: Diagnosis not present

## 2018-03-31 DIAGNOSIS — K219 Gastro-esophageal reflux disease without esophagitis: Secondary | ICD-10-CM | POA: Diagnosis not present

## 2018-03-31 DIAGNOSIS — B3781 Candidal esophagitis: Secondary | ICD-10-CM | POA: Diagnosis not present

## 2018-04-08 ENCOUNTER — Other Ambulatory Visit: Payer: BLUE CROSS/BLUE SHIELD

## 2018-04-14 ENCOUNTER — Ambulatory Visit
Admission: RE | Admit: 2018-04-14 | Discharge: 2018-04-14 | Disposition: A | Discharge status: Home / Self Care | Payer: BLUE CROSS/BLUE SHIELD | Source: Ambulatory Visit | Attending: Gastroenterology | Admitting: Gastroenterology

## 2018-04-14 DIAGNOSIS — E049 Nontoxic goiter, unspecified: Secondary | ICD-10-CM

## 2018-04-14 DIAGNOSIS — E010 Iodine-deficiency related diffuse (endemic) goiter: Secondary | ICD-10-CM | POA: Diagnosis not present

## 2018-04-15 DIAGNOSIS — K649 Unspecified hemorrhoids: Secondary | ICD-10-CM | POA: Diagnosis not present

## 2018-04-21 DIAGNOSIS — Z6827 Body mass index (BMI) 27.0-27.9, adult: Secondary | ICD-10-CM | POA: Diagnosis not present

## 2018-04-21 DIAGNOSIS — E010 Iodine-deficiency related diffuse (endemic) goiter: Secondary | ICD-10-CM | POA: Diagnosis not present

## 2018-05-30 DIAGNOSIS — E039 Hypothyroidism, unspecified: Secondary | ICD-10-CM | POA: Diagnosis not present

## 2018-05-30 DIAGNOSIS — E049 Nontoxic goiter, unspecified: Secondary | ICD-10-CM | POA: Diagnosis not present

## 2018-05-30 DIAGNOSIS — E890 Postprocedural hypothyroidism: Secondary | ICD-10-CM | POA: Diagnosis not present

## 2018-06-07 ENCOUNTER — Ambulatory Visit: Payer: BLUE CROSS/BLUE SHIELD | Admitting: Urology

## 2018-06-07 ENCOUNTER — Encounter: Payer: Self-pay | Admitting: Urology

## 2018-06-07 VITALS — BP 121/77 | HR 67 | Ht 66.0 in | Wt 164.0 lb

## 2018-06-07 DIAGNOSIS — Z8551 Personal history of malignant neoplasm of bladder: Secondary | ICD-10-CM | POA: Diagnosis not present

## 2018-06-07 LAB — URINALYSIS, COMPLETE
Bilirubin, UA: NEGATIVE
Glucose, UA: NEGATIVE
Ketones, UA: NEGATIVE
Nitrite, UA: NEGATIVE
PH UA: 7 (ref 5.0–7.5)
PROTEIN UA: NEGATIVE
RBC UA: NEGATIVE
Specific Gravity, UA: 1.015 (ref 1.005–1.030)
UUROB: 0.2 mg/dL (ref 0.2–1.0)

## 2018-06-07 LAB — MICROSCOPIC EXAMINATION
Epithelial Cells (non renal): NONE SEEN /hpf (ref 0–10)
RBC, UA: NONE SEEN /hpf (ref 0–2)

## 2018-06-07 NOTE — Progress Notes (Signed)
   06/07/18  CC:  Chief Complaint  Patient presents with  . Cysto    HPI: 67 year old female with a history of 1 cm low-grade Ta TCC status post TURBT on 02/2017.  She returns today for surveillance cystoscopy.  No gross hematuria or dysuria. NED. A&Ox3.   No respiratory distress   Abd soft, NT, ND Normal external genitalia with patent urethral meatus  Cystoscopy Procedure Note  Patient identification was confirmed, informed consent was obtained, and patient was prepped using Betadine solution.  Lidocaine jelly was administered per urethral meatus.    Preoperative abx where received prior to procedure.    Procedure: - Flexible cystoscope introduced, without any difficulty.   - Thorough search of the bladder revealed:    normal urethral meatus    normal urothelium    no stones    no ulcers     no tumors    no urethral polyps    no trabeculation  - Ureteral orifices were normal in position and appearance.  Post-Procedure: - Patient tolerated the procedure well  Assessment/ Plan:  1. History of bladder cancer NED today Per AUA guidelines, will transition to annaul cystoscopies given low risk stratification - Urinalysis, Complete   Return in about 1 year (around 06/08/2019) for cysto.   Hollice Espy, MD

## 2018-06-24 DIAGNOSIS — M1712 Unilateral primary osteoarthritis, left knee: Secondary | ICD-10-CM | POA: Diagnosis not present

## 2018-06-28 DIAGNOSIS — I10 Essential (primary) hypertension: Secondary | ICD-10-CM | POA: Diagnosis not present

## 2018-06-28 DIAGNOSIS — R079 Chest pain, unspecified: Secondary | ICD-10-CM | POA: Diagnosis not present

## 2018-06-28 DIAGNOSIS — R0602 Shortness of breath: Secondary | ICD-10-CM | POA: Diagnosis not present

## 2018-06-28 DIAGNOSIS — F418 Other specified anxiety disorders: Secondary | ICD-10-CM | POA: Diagnosis not present

## 2018-07-05 DIAGNOSIS — Z23 Encounter for immunization: Secondary | ICD-10-CM | POA: Diagnosis not present

## 2018-07-27 DIAGNOSIS — M1712 Unilateral primary osteoarthritis, left knee: Secondary | ICD-10-CM | POA: Diagnosis not present

## 2018-08-01 DIAGNOSIS — B3781 Candidal esophagitis: Secondary | ICD-10-CM | POA: Diagnosis not present

## 2018-08-01 DIAGNOSIS — E010 Iodine-deficiency related diffuse (endemic) goiter: Secondary | ICD-10-CM | POA: Diagnosis not present

## 2018-08-01 DIAGNOSIS — K219 Gastro-esophageal reflux disease without esophagitis: Secondary | ICD-10-CM | POA: Diagnosis not present

## 2018-08-04 DIAGNOSIS — E039 Hypothyroidism, unspecified: Secondary | ICD-10-CM | POA: Diagnosis not present

## 2018-08-25 DIAGNOSIS — E049 Nontoxic goiter, unspecified: Secondary | ICD-10-CM | POA: Diagnosis not present

## 2018-08-25 DIAGNOSIS — E039 Hypothyroidism, unspecified: Secondary | ICD-10-CM | POA: Diagnosis not present

## 2018-09-01 DIAGNOSIS — M1712 Unilateral primary osteoarthritis, left knee: Secondary | ICD-10-CM | POA: Diagnosis not present

## 2018-09-06 DIAGNOSIS — E7800 Pure hypercholesterolemia, unspecified: Secondary | ICD-10-CM | POA: Diagnosis not present

## 2018-09-06 DIAGNOSIS — M8580 Other specified disorders of bone density and structure, unspecified site: Secondary | ICD-10-CM | POA: Diagnosis not present

## 2018-09-06 DIAGNOSIS — Z23 Encounter for immunization: Secondary | ICD-10-CM | POA: Diagnosis not present

## 2018-09-06 DIAGNOSIS — Z0000 Encounter for general adult medical examination without abnormal findings: Secondary | ICD-10-CM | POA: Diagnosis not present

## 2018-09-06 DIAGNOSIS — G4700 Insomnia, unspecified: Secondary | ICD-10-CM | POA: Diagnosis not present

## 2018-09-08 DIAGNOSIS — M1712 Unilateral primary osteoarthritis, left knee: Secondary | ICD-10-CM | POA: Diagnosis not present

## 2018-09-18 DIAGNOSIS — Z880 Allergy status to penicillin: Secondary | ICD-10-CM | POA: Diagnosis not present

## 2018-09-18 DIAGNOSIS — E785 Hyperlipidemia, unspecified: Secondary | ICD-10-CM | POA: Diagnosis not present

## 2018-09-18 DIAGNOSIS — I129 Hypertensive chronic kidney disease with stage 1 through stage 4 chronic kidney disease, or unspecified chronic kidney disease: Secondary | ICD-10-CM | POA: Diagnosis not present

## 2018-09-18 DIAGNOSIS — K5730 Diverticulosis of large intestine without perforation or abscess without bleeding: Secondary | ICD-10-CM | POA: Diagnosis not present

## 2018-09-18 DIAGNOSIS — R110 Nausea: Secondary | ICD-10-CM | POA: Diagnosis not present

## 2018-09-18 DIAGNOSIS — Z885 Allergy status to narcotic agent status: Secondary | ICD-10-CM | POA: Diagnosis not present

## 2018-09-18 DIAGNOSIS — N189 Chronic kidney disease, unspecified: Secondary | ICD-10-CM | POA: Diagnosis not present

## 2018-09-18 DIAGNOSIS — Z79899 Other long term (current) drug therapy: Secondary | ICD-10-CM | POA: Diagnosis not present

## 2018-09-18 DIAGNOSIS — R509 Fever, unspecified: Secondary | ICD-10-CM | POA: Diagnosis not present

## 2018-09-18 DIAGNOSIS — R1032 Left lower quadrant pain: Secondary | ICD-10-CM | POA: Diagnosis not present

## 2018-10-07 DIAGNOSIS — W010XXA Fall on same level from slipping, tripping and stumbling without subsequent striking against object, initial encounter: Secondary | ICD-10-CM | POA: Diagnosis not present

## 2018-10-07 DIAGNOSIS — M1712 Unilateral primary osteoarthritis, left knee: Secondary | ICD-10-CM | POA: Diagnosis not present

## 2018-10-07 DIAGNOSIS — S8012XA Contusion of left lower leg, initial encounter: Secondary | ICD-10-CM | POA: Diagnosis not present

## 2018-10-07 DIAGNOSIS — S8002XA Contusion of left knee, initial encounter: Secondary | ICD-10-CM | POA: Diagnosis not present

## 2018-10-10 ENCOUNTER — Other Ambulatory Visit: Payer: Self-pay | Admitting: Orthopedic Surgery

## 2018-10-10 DIAGNOSIS — S8012XA Contusion of left lower leg, initial encounter: Secondary | ICD-10-CM

## 2018-10-10 DIAGNOSIS — M1712 Unilateral primary osteoarthritis, left knee: Secondary | ICD-10-CM

## 2018-10-10 DIAGNOSIS — S8002XA Contusion of left knee, initial encounter: Secondary | ICD-10-CM

## 2018-10-10 DIAGNOSIS — W19XXXA Unspecified fall, initial encounter: Secondary | ICD-10-CM

## 2018-10-12 DIAGNOSIS — R079 Chest pain, unspecified: Secondary | ICD-10-CM | POA: Diagnosis not present

## 2018-10-12 DIAGNOSIS — E039 Hypothyroidism, unspecified: Secondary | ICD-10-CM | POA: Diagnosis not present

## 2018-10-12 DIAGNOSIS — I10 Essential (primary) hypertension: Secondary | ICD-10-CM | POA: Diagnosis not present

## 2018-10-12 DIAGNOSIS — R0602 Shortness of breath: Secondary | ICD-10-CM | POA: Diagnosis not present

## 2018-10-28 ENCOUNTER — Ambulatory Visit
Admission: RE | Admit: 2018-10-28 | Discharge: 2018-10-28 | Disposition: A | Discharge status: Home / Self Care | Payer: BLUE CROSS/BLUE SHIELD | Source: Ambulatory Visit | Attending: Orthopedic Surgery | Admitting: Orthopedic Surgery

## 2018-10-28 DIAGNOSIS — S8002XA Contusion of left knee, initial encounter: Secondary | ICD-10-CM | POA: Diagnosis not present

## 2018-10-28 DIAGNOSIS — W19XXXA Unspecified fall, initial encounter: Secondary | ICD-10-CM | POA: Insufficient documentation

## 2018-10-28 DIAGNOSIS — S8012XA Contusion of left lower leg, initial encounter: Secondary | ICD-10-CM | POA: Diagnosis not present

## 2018-10-28 DIAGNOSIS — M1712 Unilateral primary osteoarthritis, left knee: Secondary | ICD-10-CM | POA: Insufficient documentation

## 2018-11-14 DIAGNOSIS — R1032 Left lower quadrant pain: Secondary | ICD-10-CM | POA: Diagnosis not present

## 2018-11-14 DIAGNOSIS — R1013 Epigastric pain: Secondary | ICD-10-CM | POA: Diagnosis not present

## 2018-12-06 DIAGNOSIS — R0781 Pleurodynia: Secondary | ICD-10-CM | POA: Diagnosis not present

## 2018-12-06 DIAGNOSIS — R1032 Left lower quadrant pain: Secondary | ICD-10-CM | POA: Diagnosis not present

## 2018-12-06 DIAGNOSIS — R1013 Epigastric pain: Secondary | ICD-10-CM | POA: Diagnosis not present

## 2018-12-06 DIAGNOSIS — K5731 Diverticulosis of large intestine without perforation or abscess with bleeding: Secondary | ICD-10-CM | POA: Diagnosis not present

## 2018-12-12 DIAGNOSIS — R1013 Epigastric pain: Secondary | ICD-10-CM | POA: Diagnosis not present

## 2018-12-12 DIAGNOSIS — K449 Diaphragmatic hernia without obstruction or gangrene: Secondary | ICD-10-CM | POA: Diagnosis not present

## 2019-01-11 DIAGNOSIS — E049 Nontoxic goiter, unspecified: Secondary | ICD-10-CM | POA: Diagnosis not present

## 2019-01-11 DIAGNOSIS — E039 Hypothyroidism, unspecified: Secondary | ICD-10-CM | POA: Diagnosis not present

## 2019-01-12 DIAGNOSIS — I10 Essential (primary) hypertension: Secondary | ICD-10-CM | POA: Diagnosis not present

## 2019-01-12 DIAGNOSIS — F418 Other specified anxiety disorders: Secondary | ICD-10-CM | POA: Diagnosis not present

## 2019-01-12 DIAGNOSIS — R079 Chest pain, unspecified: Secondary | ICD-10-CM | POA: Diagnosis not present

## 2019-01-12 DIAGNOSIS — R0602 Shortness of breath: Secondary | ICD-10-CM | POA: Diagnosis not present

## 2019-01-31 DIAGNOSIS — H1131 Conjunctival hemorrhage, right eye: Secondary | ICD-10-CM | POA: Diagnosis not present

## 2019-02-01 ENCOUNTER — Other Ambulatory Visit: Payer: Self-pay | Admitting: Orthopedic Surgery

## 2019-02-01 ENCOUNTER — Other Ambulatory Visit: Payer: Self-pay

## 2019-02-01 ENCOUNTER — Ambulatory Visit
Admission: RE | Admit: 2019-02-01 | Discharge: 2019-02-01 | Disposition: A | Discharge status: Home / Self Care | Payer: BLUE CROSS/BLUE SHIELD | Source: Ambulatory Visit | Attending: Orthopedic Surgery | Admitting: Orthopedic Surgery

## 2019-02-01 DIAGNOSIS — I83812 Varicose veins of left lower extremities with pain: Secondary | ICD-10-CM | POA: Insufficient documentation

## 2019-02-01 DIAGNOSIS — R600 Localized edema: Secondary | ICD-10-CM | POA: Diagnosis not present

## 2019-02-15 DIAGNOSIS — K5731 Diverticulosis of large intestine without perforation or abscess with bleeding: Secondary | ICD-10-CM | POA: Diagnosis not present

## 2019-02-15 DIAGNOSIS — R1032 Left lower quadrant pain: Secondary | ICD-10-CM | POA: Diagnosis not present

## 2019-02-15 DIAGNOSIS — C679 Malignant neoplasm of bladder, unspecified: Secondary | ICD-10-CM | POA: Diagnosis not present

## 2019-02-21 ENCOUNTER — Encounter (INDEPENDENT_AMBULATORY_CARE_PROVIDER_SITE_OTHER): Payer: Self-pay | Admitting: Nurse Practitioner

## 2019-02-21 ENCOUNTER — Other Ambulatory Visit: Payer: Self-pay

## 2019-02-21 ENCOUNTER — Ambulatory Visit (INDEPENDENT_AMBULATORY_CARE_PROVIDER_SITE_OTHER): Payer: BC Managed Care – PPO | Admitting: Nurse Practitioner

## 2019-02-21 VITALS — BP 165/96 | HR 65 | Resp 16 | Ht 66.0 in | Wt 167.8 lb

## 2019-02-21 DIAGNOSIS — I10 Essential (primary) hypertension: Secondary | ICD-10-CM

## 2019-02-21 DIAGNOSIS — E785 Hyperlipidemia, unspecified: Secondary | ICD-10-CM

## 2019-02-21 DIAGNOSIS — I83813 Varicose veins of bilateral lower extremities with pain: Secondary | ICD-10-CM

## 2019-02-21 DIAGNOSIS — Z79899 Other long term (current) drug therapy: Secondary | ICD-10-CM | POA: Diagnosis not present

## 2019-02-26 ENCOUNTER — Encounter (INDEPENDENT_AMBULATORY_CARE_PROVIDER_SITE_OTHER): Payer: Self-pay | Admitting: Nurse Practitioner

## 2019-02-26 DIAGNOSIS — I83813 Varicose veins of bilateral lower extremities with pain: Secondary | ICD-10-CM | POA: Insufficient documentation

## 2019-02-26 NOTE — Progress Notes (Signed)
SUBJECTIVE:  Patient ID: Gloria Bell, female    DOB: 1951-04-16, 68 y.o.   MRN: [id number] Chief Complaint  Patient presents with  . New Patient (Initial Visit)    ref Arvella Nigh for varicose veins    HPI  Gloria Bell is a 68 y.o. female The patient is seen for evaluation of symptomatic varicose veins. The patient relates burning and stinging which worsened steadily throughout the course of the day, particularly with standing. The patient also notes an aching and throbbing pain over the varicosities, particularly with prolonged dependent positions. The symptoms are significantly improved with elevation.  The patient also notes that during hot weather the symptoms are greatly intensified. The patient states the pain from the varicose veins interferes with work, daily exercise, shopping and household maintenance. At this point, the symptoms are persistent and severe enough that they're having a negative impact on lifestyle and are interfering with daily activities.  There is no history of DVT, PE or superficial thrombophlebitis. There is no history of ulceration or hemorrhage. The patient denies a significant family history of varicose veins.   The patient has  worn graduated compression in the past, but states that she is unable to wear them at this time.  At the present time the patient has not been using over-the-counter analgesics. There is no history of prior surgical intervention or sclerotherapy.    Past Medical History:  Diagnosis Date  . Arthritis   . Chronic kidney disease    Bladder Tumor  . Diverticulitis   . Diverticulosis   . Fall from ladder 09/2003  . History of kidney stones 1987  . Hyperlipidemia   . Hypertension   . Osteoarthritis   . Osteoporosis     Past Surgical History:  Procedure Laterality Date  . ABDOMINAL HYSTERECTOMY    . APPENDECTOMY    . CHOLECYSTECTOMY  1992  . CYSTOSCOPY W/ RETROGRADES Bilateral 03/01/2017   Procedure: CYSTOSCOPY WITH  RETROGRADE PYELOGRAM;  Surgeon: Hollice Espy, MD;  Location: [facility];  Service: Urology;  Laterality: Bilateral;  . ELBOW FRACTURE SURGERY Right   . FRACTURE SURGERY    . KNEE SURGERY Bilateral   . OTHER SURGICAL HISTORY     had to be on percocet for a year and it slowed down her "gut"   . TRANSURETHRAL RESECTION OF BLADDER TUMOR WITH MITOMYCIN-C N/A 03/01/2017   Procedure: TRANSURETHRAL RESECTION OF BLADDER TUMOR WITH MITOMYCIN-C;  Surgeon: Hollice Espy, MD;  Location: [facility];  Service: Urology;  Laterality: N/A;    Social History   Socioeconomic History  . Marital status: Married    Spouse name: Not on file  . Number of children: Not on file  . Years of education: Not on file  . Highest education level: Not on file  Occupational History  . Not on file  Social Needs  . Financial resource strain: Not on file  . Food insecurity:    Worry: Not on file    Inability: Not on file  . Transportation needs:    Medical: Not on file    Non-medical: Not on file  Tobacco Use  . Smoking status: Never Smoker  . Smokeless tobacco: Never Used  Substance and Sexual Activity  . Alcohol use: No  . Drug use: No  . Sexual activity: Not on file  Lifestyle  . Physical activity:    Days per week: Not on file    Minutes per session: Not on file  . Stress: Not  on file  Relationships  . Social connections:    Talks on phone: Not on file    Gets together: Not on file    Attends religious service: Not on file    Active member of club or organization: Not on file    Attends meetings of clubs or organizations: Not on file    Relationship status: Not on file  . Intimate partner violence:    Fear of current or ex partner: Not on file    Emotionally abused: Not on file    Physically abused: Not on file    Forced sexual activity: Not on file  Other Topics Concern  . Not on file  Social History Narrative  . Not on file    Family History  Problem Relation Age of Onset  . Heart disease  Father        heart attack  . Diabetes Father   . Cancer Paternal Grandfather        prostate    Allergies  Allergen Reactions  . Amoxicillin Other (See Comments)    Not sure can take Augmentin per patient.  . Cephalexin Nausea And Vomiting  . Hydrochlorothiazide Nausea And Vomiting  . Lisinopril Other (See Comments)  . Oxycodone-Acetaminophen Nausea And Vomiting  . Sulfa Antibiotics Nausea And Vomiting    Other reaction(s): Unknown  . Ampicillin Nausea And Vomiting    Has patient had a PCN reaction causing immediate rash, facial/tongue/throat swelling, SOB or lightheadedness with hypotension: No Has patient had a PCN reaction causing severe rash involving mucus membranes or skin necrosis: No Has patient had a PCN reaction that required hospitalization: No Has patient had a PCN reaction occurring within the last 10 years: No If all of the above answers are "NO", then may proceed with Cephalosporin use..   . Ciprofloxacin Dermatitis    Pt states felt like "burning under her skin all over" Pt states felt like "burning under her skin all over"  . Clarithromycin Nausea And Vomiting  . Codeine Nausea And Vomiting and Other (See Comments)  . Hydrocodone-Acetaminophen Nausea And Vomiting  . Metronidazole Dermatitis and Nausea And Vomiting    States previously had n/v to this med then had a rxt with "burning skin all over" States previously had n/v to this med then had a rxt with "burning skin all over"      Review of Systems   Review of Systems: Negative Unless Checked Constitutional: [] Weight loss  [] Fever  [] Chills Cardiac: [] Chest pain   []  Atrial Fibrillation  [] Palpitations   [] Shortness of breath when laying flat   [] Shortness of breath with exertion. [] Shortness of breath at rest Vascular:  [] Pain in legs with walking   [] Pain in legs with standing [] Pain in legs when laying flat   [] Claudication    [] Pain in feet when laying flat    [] History of DVT   [] Phlebitis    [] Swelling in legs   [x] Varicose veins   [] Non-healing ulcers Pulmonary:   [] Uses home oxygen   [] Productive cough   [] Hemoptysis   [] Wheeze  [] COPD   [] Asthma Neurologic:  [] Dizziness   [] Seizures  [] Blackouts [] History of stroke   [] History of TIA  [] Aphasia   [] Temporary Blindness   [] Weakness or numbness in arm   [] Weakness or numbness in leg Musculoskeletal:   [] Joint swelling   [] Joint pain   [] Low back pain  []  History of Knee Replacement [] Arthritis [] back Surgeries  []  Spinal Stenosis    Hematologic:  [] Easy  bruising  [] Easy bleeding   [] Hypercoagulable state   [] Anemic Gastrointestinal:  [] Diarrhea   [] Vomiting  [] Gastroesophageal reflux/heartburn   [] Difficulty swallowing. [] Abdominal pain Genitourinary:  [] Chronic kidney disease   [] Difficult urination  [] Anuric   [] Blood in urine [] Frequent urination  [] Burning with urination   [] Hematuria Skin:  [] Rashes   [] Ulcers [] Wounds Psychological:  [] History of anxiety   []  History of major depression  []  Memory Difficulties      OBJECTIVE:   Physical Exam  BP (!) 165/96 (BP Location: Left Arm)   Pulse 65   Resp 16   Ht 5\' 6"  (1.676 m)   Wt 167 lb 12.8 oz (76.1 kg)   BMI 27.08 kg/m   Gen: WD/WN, NAD Head: [place]/AT, No temporalis wasting.  Ear/Nose/Throat: Hearing grossly intact, nares w/o erythema or drainage Eyes: PER, EOMI, sclera nonicteric.  Neck: Supple, no masses.  No JVD.  Pulmonary:  Good air movement, no use of accessory muscles.  Cardiac: RRR Vascular:  Vessel Right Left  Radial Palpable Palpable  Dorsalis Pedis Palpable Palpable  Posterior Tibial Palpable Palpable   Gastrointestinal: soft, non-distended. No guarding/no peritoneal signs.  Musculoskeletal: M/S 5/5 throughout.  No deformity or atrophy.  Neurologic: Pain and light touch intact in extremities.  Symmetrical.  Speech is fluent. Motor exam as listed above. Psychiatric: Judgment intact, Mood & affect appropriate for pt's clinical situation. Dermatologic:  No Venous rashes. No Ulcers Noted.  No changes consistent with cellulitis. Lymph : No Cervical lymphadenopathy, no lichenification or skin changes of chronic lymphedema.       ASSESSMENT AND PLAN:  1. Varicose veins of both lower extremities with pain  Recommend:  The patient has large symptomatic varicose veins that are painful and associated with swelling.  I have had a long discussion with the patient regarding  varicose veins and why they cause symptoms.  Patient will begin wearing graduated compression stockings class 1 on a daily basis, beginning first thing in the morning and removing them in the evening. The patient is instructed specifically not to sleep in the stockings.    The patient  will also begin using over-the-counter analgesics such as Motrin 600 mg po TID to help control the symptoms.    In addition, behavioral modification including elevation during the day will be initiated.    Pending the results of these changes the  patient will be reevaluated in three months.   An  ultrasound of the venous system will be obtained.   Further plans will be based on the ultrasound results and whether conservative therapies are successful at eliminating the pain and swelling.  - VAS [place] LOWER EXTREMITY VENOUS REFLUX; Future  2. Hypertension, unspecified type Continue antihypertensive medications as already ordered, these medications have been reviewed and there are no changes at this time.   3. Hyperlipidemia, unspecified hyperlipidemia type Continue statin as ordered and reviewed, no changes at this time    Current Outpatient Medications on File Prior to Visit  Medication Sig Dispense Refill  . acetaminophen (TYLENOL 8 HOUR ARTHRITIS PAIN) 650 MG CR tablet Take 1,300 mg by mouth every 8 (eight) hours as needed for pain.    . candesartan (ATACAND) 16 MG tablet Take 16 mg by mouth 2 (two) times daily.    Marland Kitchen levothyroxine (SYNTHROID) 50 MCG tablet PLEASE SEE ATTACHED FOR DETAILED  DIRECTIONS    . metoprolol succinate (TOPROL-XL) 50 MG 24 hr tablet Take 50 mg by mouth daily. Take with or immediately following a meal.    .  Multiple Vitamin (MULTIVITAMIN) tablet Take 1 tablet by mouth daily.    . polyethylene glycol (MIRALAX / GLYCOLAX) packet Take 17 g by mouth 2 (two) times daily. Takes at 3 pm and 7 pm    . simvastatin (ZOCOR) 20 MG tablet Take 20 mg by mouth every morning.    . zolpidem (AMBIEN) 10 MG tablet Take 10 mg by mouth daily as needed.  5  . HYDROcodone-acetaminophen (NORCO/VICODIN) 5-325 MG tablet Take 1 tablet by mouth as needed.  0  . losartan (COZAAR) 50 MG tablet Take 50 mg by mouth 2 (two) times daily.     . ondansetron (ZOFRAN) 4 MG tablet Take 1 tablet (4 mg total) by mouth as needed. (Patient not taking: Reported on 02/21/2019) 20 tablet 0   No current facility-administered medications on file prior to visit.     There are no Patient Instructions on file for this visit. No follow-ups on file.   Kris Hartmann, NP  This note was completed with Sales executive.  Any errors are purely unintentional.

## 2019-03-07 DIAGNOSIS — I10 Essential (primary) hypertension: Secondary | ICD-10-CM | POA: Diagnosis not present

## 2019-03-07 DIAGNOSIS — E7800 Pure hypercholesterolemia, unspecified: Secondary | ICD-10-CM | POA: Diagnosis not present

## 2019-03-09 DIAGNOSIS — K5909 Other constipation: Secondary | ICD-10-CM | POA: Diagnosis not present

## 2019-03-09 DIAGNOSIS — I10 Essential (primary) hypertension: Secondary | ICD-10-CM | POA: Diagnosis not present

## 2019-03-09 DIAGNOSIS — G4700 Insomnia, unspecified: Secondary | ICD-10-CM | POA: Diagnosis not present

## 2019-03-09 DIAGNOSIS — E7800 Pure hypercholesterolemia, unspecified: Secondary | ICD-10-CM | POA: Diagnosis not present

## 2019-03-09 DIAGNOSIS — Z1331 Encounter for screening for depression: Secondary | ICD-10-CM | POA: Diagnosis not present

## 2019-03-21 ENCOUNTER — Ambulatory Visit (INDEPENDENT_AMBULATORY_CARE_PROVIDER_SITE_OTHER): Payer: BC Managed Care – PPO

## 2019-03-21 ENCOUNTER — Other Ambulatory Visit: Payer: Self-pay

## 2019-03-21 ENCOUNTER — Encounter (INDEPENDENT_AMBULATORY_CARE_PROVIDER_SITE_OTHER): Payer: Self-pay | Admitting: Vascular Surgery

## 2019-03-21 ENCOUNTER — Ambulatory Visit (INDEPENDENT_AMBULATORY_CARE_PROVIDER_SITE_OTHER): Payer: BC Managed Care – PPO | Admitting: Vascular Surgery

## 2019-03-21 VITALS — BP 170/100 | HR 64 | Resp 18 | Ht 66.0 in | Wt 167.0 lb

## 2019-03-21 DIAGNOSIS — I10 Essential (primary) hypertension: Secondary | ICD-10-CM

## 2019-03-21 DIAGNOSIS — Z79899 Other long term (current) drug therapy: Secondary | ICD-10-CM | POA: Diagnosis not present

## 2019-03-21 DIAGNOSIS — E785 Hyperlipidemia, unspecified: Secondary | ICD-10-CM

## 2019-03-21 DIAGNOSIS — I83813 Varicose veins of bilateral lower extremities with pain: Secondary | ICD-10-CM | POA: Diagnosis not present

## 2019-03-21 NOTE — Assessment & Plan Note (Signed)
blood pressure control important in reducing the progression of atherosclerotic disease. On appropriate oral medications.  

## 2019-03-21 NOTE — Patient Instructions (Signed)
Nonsurgical Procedures for Varicose Veins Various nonsurgical procedures can be used to treat varicose veins. Varicose veins are swollen, twisted veins that are visible under the skin. They occur most often in the legs. These veins may appear blue and bulging. Varicose veins are caused by damage to the valves in veins. All veins have a valve that makes blood flow in only one direction. If a valve gets weak or damaged, blood can pool and cause varicose veins. You may need a procedure to treat your varicose veins if they are causing symptoms or complications, or if lifestyle changes have not helped. These procedures can reduce pain, aching, and the risk of bleeding and blood clots. They can also improve the way the affected area looks (cosmetic appearance). The three common nonsurgical procedures are:  Sclerotherapy. A chemical is injected to close off a vein.  Laser treatment. Light energy is applied to close off the vein.  Radiofrequency vein ablation. Electrical energy is used to produce heat that closes off the vein. Your health care provider will discuss the method that is best for you based on your condition. Tell a health care provider about:  Any allergies you have.  All medicines you are taking, including vitamins, herbs, eye drops, creams, and over-the-counter medicines.  Any problems you or family members have had with anesthetic medicines.  Any blood disorders you have.  Any surgeries you have had.  Any medical conditions you have.  Whether you are pregnant or may be pregnant. What are the risks? Generally, this is a safe procedure. However, problems may occur, including:  Damage to nearby nerves, tissues, or veins.  Skin irritation, sores, or dark spots.  Numbness.  Clotting.  Infection.  Allergic reactions to medicines.  Scarring.  Leg swelling.  Need for additional treatments.  Bruising. What happens before the procedure?  Ask your health care provider  about: ? Changing or stopping your regular medicines. This is especially important if you are taking diabetes medicines or blood thinners. ? Taking over-the-counter medicines, vitamins, herbs, and supplements. ? Taking medicines such as aspirin and ibuprofen. These medicines can thin your blood. Do not take these medicines unless your health care provider tells you to take them.  You may have an exam or testing. This can include a tests to: ? Check for clots and check blood flow using sound waves (Doppler ultrasound). ? Observe how blood flows through your veins by injecting a dye that outlines your veins on X-rays (angiogram). This test is used in rare cases. What happens during the procedure? One of the following procedures will be performed: Sclerotherapy This procedure is often used for small to medium veins.  A chemical (sclerosant) that irritates the lining of the vein will be injected into the vein. This will cause the varicose vein to be closed off. Sclerosants in different amounts and strengths can be used, depending on the size and location of the vein.  All of the varicose vein sites will be injected. You may need more than one treatment because new varicose veins may develop, or more than one injection may be needed for each varicose vein.  Laser treatment There are two ways that lasers are used to treat varicose veins:  Light energy from a laser may be directed onto the vein through the skin.  A needle may be used to pass a thin laser catheter into the vein to cause it to close. You may need more than one treatment if the vein re-opens. In some cases,   laser treatment may be combined with sclerotherapy. Radiofrequency vein ablation   You will be given a medicine that numbs the area (local anesthetic).  A small incision will be made near the varicose vein.  A thin tube (catheter) will be threaded into your vein.  The tip of the catheter will deploy electrodes.  The  electrodes will deliver electrical energy to produce heat that closes off the vein. What happens after the procedure?  A bandage (dressing) may be used to cover the injection site or incisions.  You may have to wear compression stockings. These stockings help to prevent blood clots and reduce swelling in your legs.  Return to your normal activities as told by your health care provider. Summary  Varicose veins are swollen, twisted veins that are visible under the skin. They occur most often in the legs.  Various procedures can be used to treat varicose veins. You may need a procedure to treat your varicose veins if they are causing symptoms or complications, or if lifestyle changes have not helped.  Your health care provider will discuss the method that is best for you based on your condition. This information is not intended to replace advice given to you by your health care provider. Make sure you discuss any questions you have with your health care provider. Document Released: 12/18/2016 Document Revised: 12/30/2018 Document Reviewed: 12/18/2016 [employer] Patient Education  2020 [employer]  

## 2019-03-21 NOTE — Progress Notes (Signed)
MRN : [id number]  SHARNA GABRYS Gloria Bell is a 68 y.o. (16-Jun-1951) female who presents with chief complaint of  Chief Complaint  Patient presents with  . Follow-up    bilateral LE venous reflux study  .  History of Present Illness: Patient returns today in follow up of her leg pain and swelling.  She has been appropriately wearing compression stockings, elevating her legs, and using anti-inflammatories for several months now.  This started after she saw an orthopedic surgeon.  She saw [place] about a month ago and returns today with her duplex study.  She really has not had any improvement in her symptoms. Her reflux study today shows reflux in both great saphenous veins without evidence of deep venous thrombosis or superficial thrombophlebitis.  Current Outpatient Medications  Medication Sig Dispense Refill  . acetaminophen (TYLENOL 8 HOUR ARTHRITIS PAIN) 650 MG CR tablet Take 1,300 mg by mouth every 8 (eight) hours as needed for pain.    . candesartan (ATACAND) 16 MG tablet Take 16 mg by mouth 2 (two) times daily.    Marland Kitchen levothyroxine (SYNTHROID) 50 MCG tablet PLEASE SEE ATTACHED FOR DETAILED DIRECTIONS    . metoprolol succinate (TOPROL-XL) 50 MG 24 hr tablet Take 50 mg by mouth daily. Take with or immediately following a meal.    . Multiple Vitamin (MULTIVITAMIN) tablet Take 1 tablet by mouth daily.    . ondansetron (ZOFRAN) 4 MG tablet Take 1 tablet (4 mg total) by mouth as needed. 20 tablet 0  . polyethylene glycol (MIRALAX / GLYCOLAX) packet Take 17 g by mouth 2 (two) times daily. Takes at 3 pm and 7 pm    . simvastatin (ZOCOR) 20 MG tablet Take 20 mg by mouth every morning.    . zolpidem (AMBIEN) 10 MG tablet Take 10 mg by mouth daily as needed.  5  . HYDROcodone-acetaminophen (NORCO/VICODIN) 5-325 MG tablet Take 1 tablet by mouth as needed.  0  . losartan (COZAAR) 50 MG tablet Take 50 mg by mouth 2 (two) times daily.     . nitroGLYCERIN (NITROSTAT) 0.4 MG SL tablet     . PROCTOSOL HC 2.5 %  rectal cream APPLY TO AFFECTED AREA TWICE A DAY     No current facility-administered medications for this visit.     Past Medical History:  Diagnosis Date  . Arthritis   . Chronic kidney disease    Bladder Tumor  . Diverticulitis   . Diverticulosis   . Fall from ladder 09/2003  . History of kidney stones 1987  . Hyperlipidemia   . Hypertension   . Osteoarthritis   . Osteoporosis     Past Surgical History:  Procedure Laterality Date  . ABDOMINAL HYSTERECTOMY    . APPENDECTOMY    . CHOLECYSTECTOMY  1992  . CYSTOSCOPY W/ RETROGRADES Bilateral 03/01/2017   Procedure: CYSTOSCOPY WITH RETROGRADE PYELOGRAM;  Surgeon: Hollice Espy, MD;  Location: [facility];  Service: Urology;  Laterality: Bilateral;  . ELBOW FRACTURE SURGERY Right   . FRACTURE SURGERY    . KNEE SURGERY Bilateral   . OTHER SURGICAL HISTORY     had to be on percocet for a year and it slowed down her "gut"   . TRANSURETHRAL RESECTION OF BLADDER TUMOR WITH MITOMYCIN-C N/A 03/01/2017   Procedure: TRANSURETHRAL RESECTION OF BLADDER TUMOR WITH MITOMYCIN-C;  Surgeon: Hollice Espy, MD;  Location: [facility];  Service: Urology;  Laterality: N/A;    Social History Social History   Tobacco Use  .  Smoking status: Never Smoker  . Smokeless tobacco: Never Used  Substance Use Topics  . Alcohol use: No  . Drug use: No    Family History Family History  Problem Relation Age of Onset  . Heart disease Father        heart attack  . Diabetes Father   . Cancer Paternal Grandfather        prostate  No bleeding or clotting disorders  Allergies  Allergen Reactions  . Cephalexin Nausea And Vomiting  . Hydrochlorothiazide Nausea And Vomiting  . Lisinopril Other (See Comments)  . Oxycodone-Acetaminophen Nausea And Vomiting  . Sulfa Antibiotics Nausea And Vomiting    Other reaction(s): Unknown  . Ampicillin Nausea And Vomiting    Has patient had a PCN reaction causing immediate rash, facial/tongue/throat swelling, SOB or  lightheadedness with hypotension: No Has patient had a PCN reaction causing severe rash involving mucus membranes or skin necrosis: No Has patient had a PCN reaction that required hospitalization: No Has patient had a PCN reaction occurring within the last 10 years: No If all of the above answers are "NO", then may proceed with Cephalosporin use..   . Ciprofloxacin Dermatitis    Pt states felt like "burning under her skin all over" Pt states felt like "burning under her skin all over"  . Clarithromycin Nausea And Vomiting  . Codeine Nausea And Vomiting and Other (See Comments)  . Hydrocodone-Acetaminophen Nausea And Vomiting  . Metronidazole Dermatitis and Nausea And Vomiting    States previously had n/v to this med then had a rxt with "burning skin all over" States previously had n/v to this med then had a rxt with "burning skin all over"      REVIEW OF SYSTEMS (Negative unless checked)  Constitutional: [] Weight loss  [] Fever  [] Chills Cardiac: [] Chest pain   [] Chest pressure   [] Palpitations   [] Shortness of breath when laying flat   [] Shortness of breath at rest   [] Shortness of breath with exertion. Vascular:  [] Pain in legs with walking   [] Pain in legs at rest   [] Pain in legs when laying flat   [] Claudication   [] Pain in feet when walking  [] Pain in feet at rest  [] Pain in feet when laying flat   [] History of DVT   [] Phlebitis   [x] Swelling in legs   [x] Varicose veins   [] Non-healing ulcers Pulmonary:   [] Uses home oxygen   [] Productive cough   [] Hemoptysis   [] Wheeze  [] COPD   [] Asthma Neurologic:  [] Dizziness  [] Blackouts   [] Seizures   [] History of stroke   [] History of TIA  [] Aphasia   [] Temporary blindness   [] Dysphagia   [] Weakness or numbness in arms   [] Weakness or numbness in legs Musculoskeletal:  [x] Arthritis   [] Joint swelling   [] Joint pain   [] Low back pain Hematologic:  [] Easy bruising  [] Easy bleeding   [] Hypercoagulable state   [] Anemic   Gastrointestinal:  [] Blood  in stool   [] Vomiting blood  [] Gastroesophageal reflux/heartburn   [] Abdominal pain Genitourinary:  [] Chronic kidney disease   [] Difficult urination  [] Frequent urination  [] Burning with urination   [] Hematuria Skin:  [] Rashes   [] Ulcers   [] Wounds Psychological:  [] History of anxiety   []  History of major depression.  Physical Examination  BP (!) 170/100 (BP Location: Right Arm)   Pulse 64   Resp 18   Ht 5\' 6"  (1.676 m)   Wt 167 lb (75.8 kg)   BMI 26.95 kg/m  Gen:  WD/WN,  NAD Head: /AT, No temporalis wasting. Ear/Nose/Throat: Hearing grossly intact, nares w/o erythema or drainage Eyes: Conjunctiva clear. Sclera non-icteric Neck: Supple.  Trachea midline Pulmonary:  Good air movement, no use of accessory muscles.  Cardiac: RRR, no JVD Vascular: Scattered superficial varicosities on the right leg, slightly more prominent varicosities are present on the left leg Vessel Right Left  Radial Palpable Palpable                          PT Palpable Palpable  DP Palpable Palpable   Gastrointestinal: soft, non-tender/non-distended. No guarding/reflex.  Musculoskeletal: M/S 5/5 throughout.  No deformity or atrophy. Trace LE edema. Neurologic: Sensation grossly intact in extremities.  Symmetrical.  Speech is fluent.  Psychiatric: Judgment intact, Mood & affect appropriate for pt's clinical situation. Dermatologic: No rashes or ulcers noted.  No cellulitis or open wounds.       Labs No results found for this or any previous visit (from the past 2160 hour(s)).  Radiology No results found.  Assessment/Plan  HLD (hyperlipidemia) lipid control important in reducing the progression of atherosclerotic disease. Continue statin therapy   HTN (hypertension) blood pressure control important in reducing the progression of atherosclerotic disease. On appropriate oral medications.   Varicose veins of both lower extremities with pain The patient has worn compression stockings now  for about 3 months after originally seeing an orthopedic surgeon.  She has been elevating her legs and taking anti-inflammatories as needed for discomfort.  At this point, her symptoms persist in both lower extremities.  Her reflux study today shows reflux in both great saphenous veins without evidence of deep venous thrombosis or superficial thrombophlebitis.  At this point, laser ablation of both great saphenous veins likely with subsequent sclerotherapy for residual varicosities if they remain symptomatic after laser ablation would be our best option.  I have discussed the procedure in detail with the patient.  I have discussed the risks and benefits of the procedure.  She voices her understanding and is agreeable to proceed    Leotis Pain, MD  03/21/2019 4:19 PM    This note was created with Dragon medical transcription system.  Any errors from dictation are purely unintentional

## 2019-03-21 NOTE — Assessment & Plan Note (Signed)
lipid control important in reducing the progression of atherosclerotic disease. Continue statin therapy  

## 2019-03-21 NOTE — Assessment & Plan Note (Signed)
The patient has worn compression stockings now for about 3 months after originally seeing an orthopedic surgeon.  She has been elevating her legs and taking anti-inflammatories as needed for discomfort.  At this point, her symptoms persist in both lower extremities.  Her reflux study today shows reflux in both great saphenous veins without evidence of deep venous thrombosis or superficial thrombophlebitis.  At this point, laser ablation of both great saphenous veins likely with subsequent sclerotherapy for residual varicosities if they remain symptomatic after laser ablation would be our best option.  I have discussed the procedure in detail with the patient.  I have discussed the risks and benefits of the procedure.  She voices her understanding and is agreeable to proceed

## 2019-04-25 ENCOUNTER — Other Ambulatory Visit (INDEPENDENT_AMBULATORY_CARE_PROVIDER_SITE_OTHER): Payer: Medicare Other | Admitting: Vascular Surgery

## 2019-04-28 ENCOUNTER — Encounter (INDEPENDENT_AMBULATORY_CARE_PROVIDER_SITE_OTHER): Payer: Medicare Other

## 2019-05-02 DIAGNOSIS — M1712 Unilateral primary osteoarthritis, left knee: Secondary | ICD-10-CM | POA: Diagnosis not present

## 2019-05-09 ENCOUNTER — Other Ambulatory Visit (INDEPENDENT_AMBULATORY_CARE_PROVIDER_SITE_OTHER): Payer: Medicare Other | Admitting: Vascular Surgery

## 2019-05-12 ENCOUNTER — Encounter (INDEPENDENT_AMBULATORY_CARE_PROVIDER_SITE_OTHER): Payer: Medicare Other

## 2019-05-15 DIAGNOSIS — R2241 Localized swelling, mass and lump, right lower limb: Secondary | ICD-10-CM | POA: Diagnosis not present

## 2019-05-15 DIAGNOSIS — I872 Venous insufficiency (chronic) (peripheral): Secondary | ICD-10-CM | POA: Diagnosis not present

## 2019-05-15 DIAGNOSIS — D2121 Benign neoplasm of connective and other soft tissue of right lower limb, including hip: Secondary | ICD-10-CM | POA: Diagnosis not present

## 2019-05-15 DIAGNOSIS — M25571 Pain in right ankle and joints of right foot: Secondary | ICD-10-CM | POA: Diagnosis not present

## 2019-05-15 DIAGNOSIS — M79671 Pain in right foot: Secondary | ICD-10-CM | POA: Diagnosis not present

## 2019-05-17 ENCOUNTER — Other Ambulatory Visit: Payer: Self-pay | Admitting: Podiatry

## 2019-05-17 DIAGNOSIS — R2241 Localized swelling, mass and lump, right lower limb: Secondary | ICD-10-CM

## 2019-05-26 ENCOUNTER — Ambulatory Visit: Payer: Medicare Other

## 2019-05-30 ENCOUNTER — Ambulatory Visit: Admission: RE | Admit: 2019-05-30 | Payer: Medicare Other | Source: Ambulatory Visit

## 2019-06-06 ENCOUNTER — Ambulatory Visit: Payer: BC Managed Care – PPO | Admitting: Urology

## 2019-06-06 ENCOUNTER — Encounter: Payer: Self-pay | Admitting: Urology

## 2019-06-06 ENCOUNTER — Other Ambulatory Visit: Payer: Self-pay

## 2019-06-06 VITALS — BP 162/85 | HR 65 | Ht 66.0 in | Wt 170.0 lb

## 2019-06-06 DIAGNOSIS — Z8551 Personal history of malignant neoplasm of bladder: Secondary | ICD-10-CM

## 2019-06-06 LAB — URINALYSIS, COMPLETE
Bilirubin, UA: NEGATIVE
Glucose, UA: NEGATIVE
Ketones, UA: NEGATIVE
Leukocytes,UA: NEGATIVE
Nitrite, UA: NEGATIVE
Protein,UA: NEGATIVE
Specific Gravity, UA: >1.03 — ABNORMAL HIGH (ref 1.005–1.030)
Urobilinogen, Ur: 0.2 mg/dL (ref 0.2–1.0)
pH, UA: 5 (ref 5.0–7.5)

## 2019-06-06 LAB — MICROSCOPIC EXAMINATION: Bacteria, UA: NONE SEEN

## 2019-06-06 NOTE — Progress Notes (Signed)
   06/06/19  CC:  Chief Complaint  Patient presents with  . Cysto    HPI: 68 year old female with a history of 1 cm low-grade Ta TCC status post TURBT on 02/2017.  She returns today for surveillance cystoscopy.  No gross hematuria or dysuria. NED. A&Ox3.   No respiratory distress   Abd soft, NT, ND Normal external genitalia with patent urethral meatus  Cystoscopy Procedure Note  Patient identification was confirmed, informed consent was obtained, and patient was prepped using Betadine solution.  Lidocaine jelly was administered per urethral meatus.    Preoperative abx where received prior to procedure.    Procedure: - Flexible cystoscope introduced, without any difficulty.   - Thorough search of the bladder revealed:    normal urethral meatus    normal urothelium    no stones    no ulcers     no tumors    no urethral polyps    no trabeculation  - Ureteral orifices were normal in position and appearance.  Post-Procedure: - Patient tolerated the procedure well  Assessment/ Plan:  1. History of bladder cancer NED today Per AUA guidelines, will transition to annaul cystoscopies given low risk stratification - Urinalysis, Complete   Return in about 1 year (around 06/05/2020) for cysto.   Hollice Espy, MD

## 2019-06-06 NOTE — Addendum Note (Signed)
Addended by: Hollice Espy on: 06/06/2019 03:28 PM   Modules accepted: Level of Service

## 2019-06-15 DIAGNOSIS — Z23 Encounter for immunization: Secondary | ICD-10-CM | POA: Diagnosis not present

## 2019-06-19 DIAGNOSIS — E039 Hypothyroidism, unspecified: Secondary | ICD-10-CM | POA: Diagnosis not present

## 2019-06-19 DIAGNOSIS — E049 Nontoxic goiter, unspecified: Secondary | ICD-10-CM | POA: Diagnosis not present

## 2019-07-20 DIAGNOSIS — I10 Essential (primary) hypertension: Secondary | ICD-10-CM | POA: Diagnosis not present

## 2019-07-20 DIAGNOSIS — I83813 Varicose veins of bilateral lower extremities with pain: Secondary | ICD-10-CM | POA: Diagnosis not present

## 2019-07-20 DIAGNOSIS — R0602 Shortness of breath: Secondary | ICD-10-CM | POA: Diagnosis not present

## 2019-07-20 DIAGNOSIS — F418 Other specified anxiety disorders: Secondary | ICD-10-CM | POA: Diagnosis not present

## 2019-07-27 DIAGNOSIS — E040 Nontoxic diffuse goiter: Secondary | ICD-10-CM | POA: Diagnosis not present

## 2019-07-27 DIAGNOSIS — E039 Hypothyroidism, unspecified: Secondary | ICD-10-CM | POA: Diagnosis not present

## 2019-08-08 DIAGNOSIS — C679 Malignant neoplasm of bladder, unspecified: Secondary | ICD-10-CM | POA: Diagnosis not present

## 2019-08-08 DIAGNOSIS — R1032 Left lower quadrant pain: Secondary | ICD-10-CM | POA: Diagnosis not present

## 2019-08-08 DIAGNOSIS — K5790 Diverticulosis of intestine, part unspecified, without perforation or abscess without bleeding: Secondary | ICD-10-CM | POA: Diagnosis not present

## 2019-09-04 DIAGNOSIS — E039 Hypothyroidism, unspecified: Secondary | ICD-10-CM | POA: Diagnosis not present

## 2019-09-04 DIAGNOSIS — H1132 Conjunctival hemorrhage, left eye: Secondary | ICD-10-CM | POA: Diagnosis not present

## 2019-09-08 DIAGNOSIS — G4700 Insomnia, unspecified: Secondary | ICD-10-CM | POA: Diagnosis not present

## 2019-09-08 DIAGNOSIS — Z9181 History of falling: Secondary | ICD-10-CM | POA: Diagnosis not present

## 2019-09-08 DIAGNOSIS — I10 Essential (primary) hypertension: Secondary | ICD-10-CM | POA: Diagnosis not present

## 2019-09-08 DIAGNOSIS — Z139 Encounter for screening, unspecified: Secondary | ICD-10-CM | POA: Diagnosis not present

## 2019-09-08 DIAGNOSIS — Z0000 Encounter for general adult medical examination without abnormal findings: Secondary | ICD-10-CM | POA: Diagnosis not present

## 2019-09-08 DIAGNOSIS — E7800 Pure hypercholesterolemia, unspecified: Secondary | ICD-10-CM | POA: Diagnosis not present

## 2019-10-12 DIAGNOSIS — R070 Pain in throat: Secondary | ICD-10-CM | POA: Diagnosis not present

## 2019-10-12 DIAGNOSIS — E041 Nontoxic single thyroid nodule: Secondary | ICD-10-CM | POA: Diagnosis not present

## 2019-10-12 DIAGNOSIS — J0100 Acute maxillary sinusitis, unspecified: Secondary | ICD-10-CM | POA: Diagnosis not present

## 2019-10-12 DIAGNOSIS — J301 Allergic rhinitis due to pollen: Secondary | ICD-10-CM | POA: Diagnosis not present

## 2019-11-27 IMAGING — CT CT ABD-PELV W/ CM
2 of 5 series · 16 of 46 positions shown, 18 images · IV contrast (APPLIED)
Comparison: CT scan of August 31, 2017.

CLINICAL DATA: Acute generalized abdominal pain.

EXAM:
CT ABDOMEN AND PELVIS WITH CONTRAST
TECHNIQUE: Multidetector CT imaging of the abdomen and pelvis was performed
using the standard protocol following bolus administration of
intravenous contrast.
CONTRAST:  75mL 3EAVUA-2EB IOPAMIDOL (3EAVUA-2EB) INJECTION 76%

[Series 2: routine abd/pel with · axial · 0.75mm/px · z∈[-953,-518]mm · 13 of 99 slices shown, 15 images]
[im 6/99  soft-tissue]
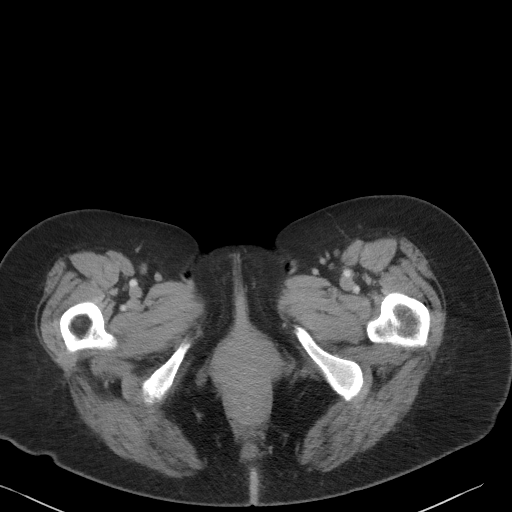
[im 6/99  bone]
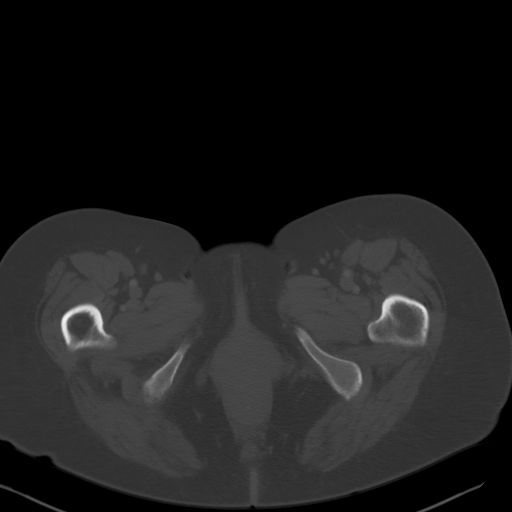
[im 16/99  soft-tissue]
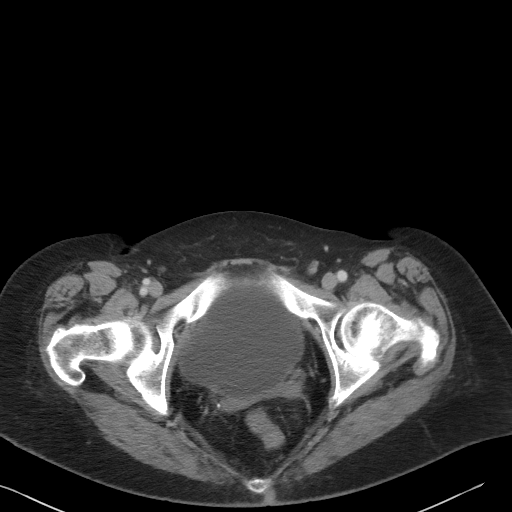
[im 21/99  soft-tissue]
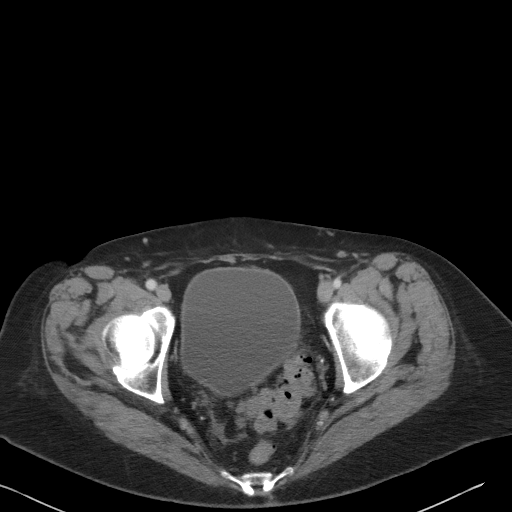
[im 26/99  soft-tissue]
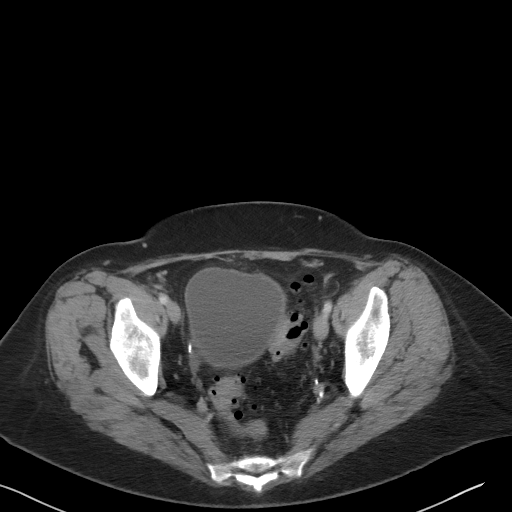
[im 37/99  soft-tissue]
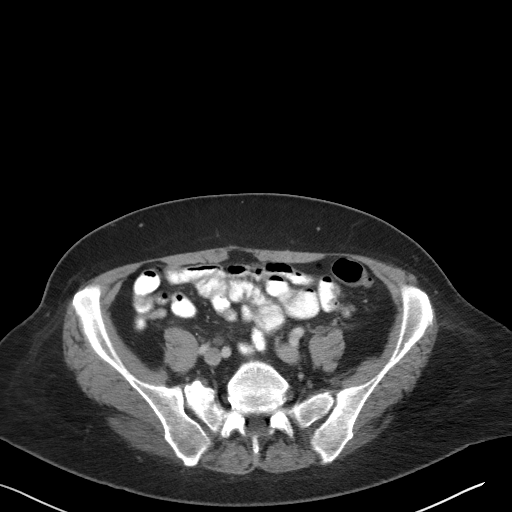
[im 42/99  soft-tissue]
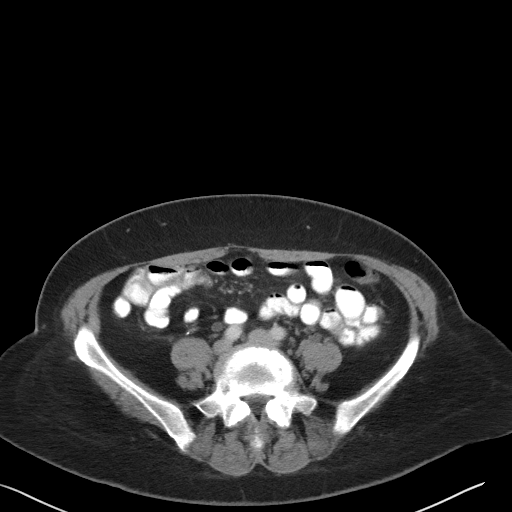
[im 52/99  soft-tissue]
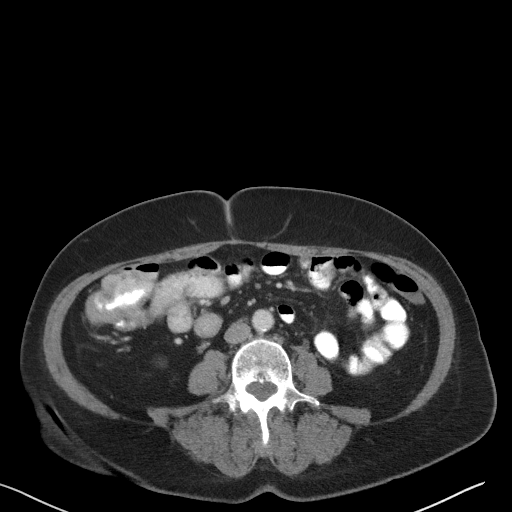
[im 57/99  soft-tissue]
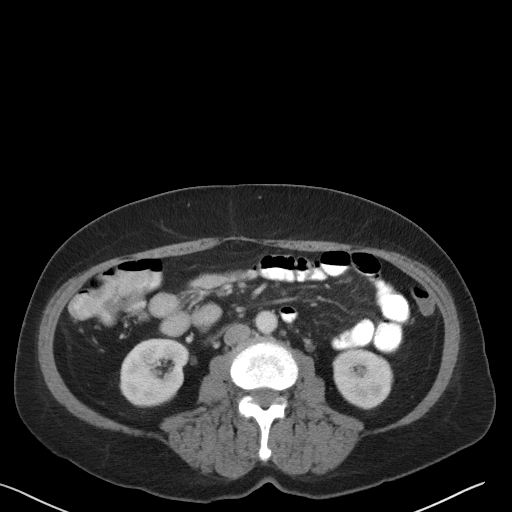
[im 62/99  soft-tissue]
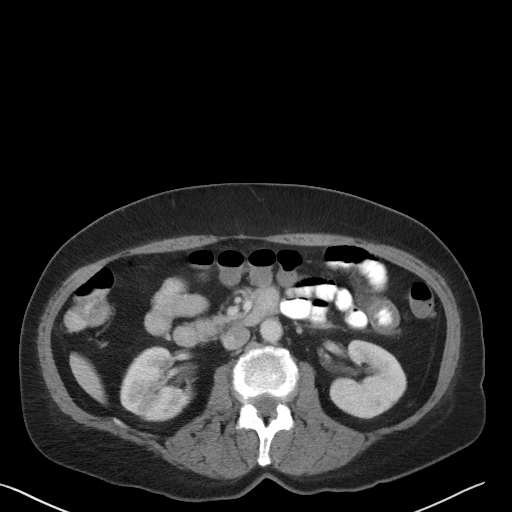
[im 62/99  bone]
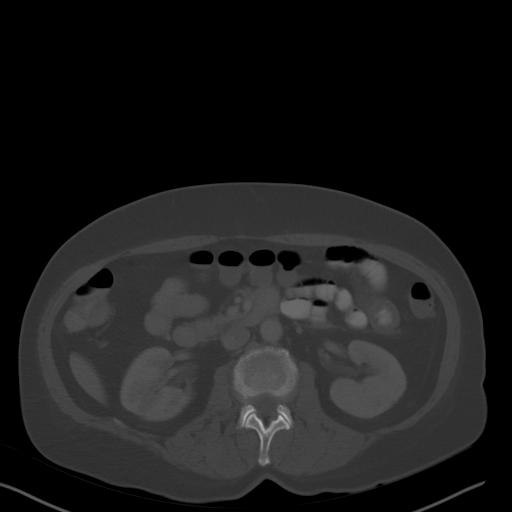
[im 73/99  soft-tissue]
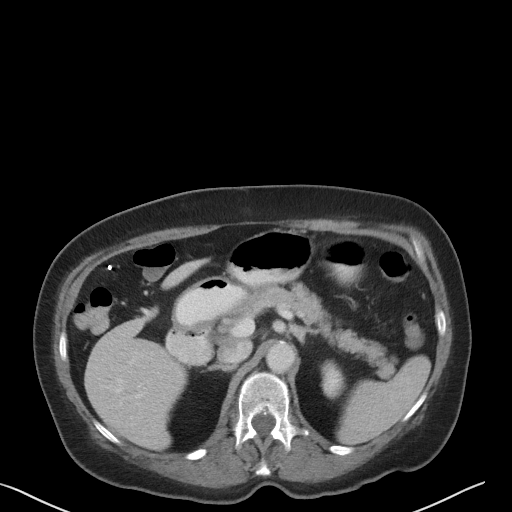
[im 78/99  soft-tissue]
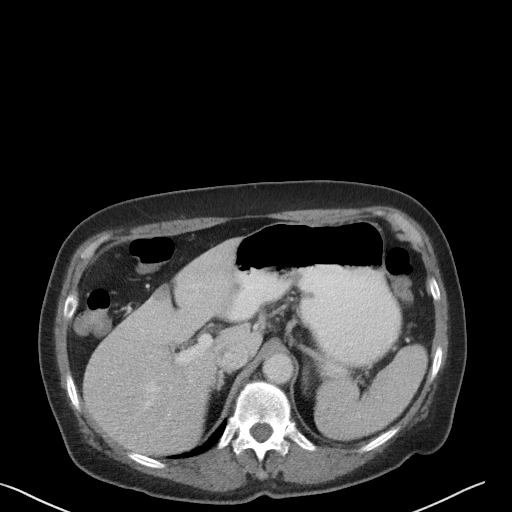
[im 83/99  soft-tissue]
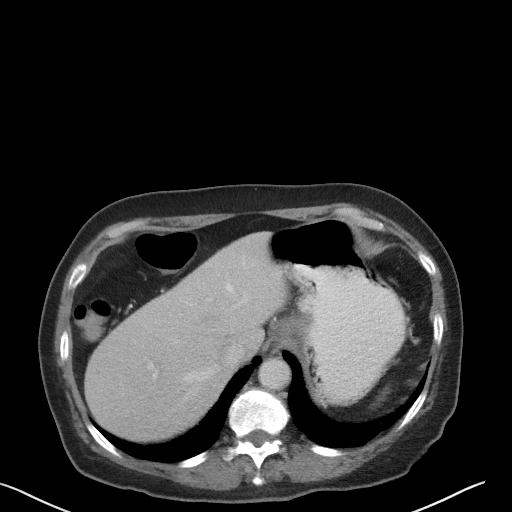
[im 93/99  soft-tissue]
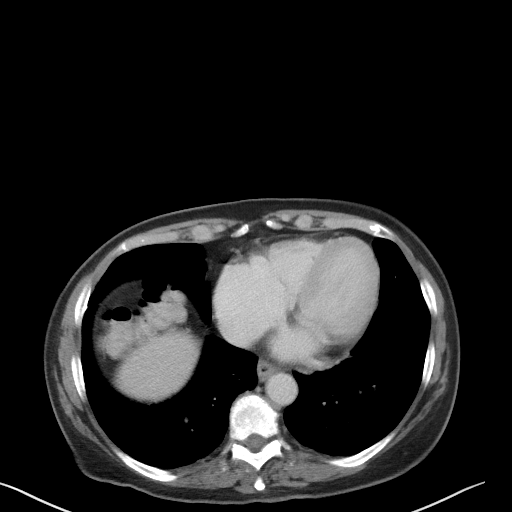

[Series 5: coronal st · coronal · 0.76mm/px · 3 of 78 slices shown]
[im 26/78  soft-tissue]
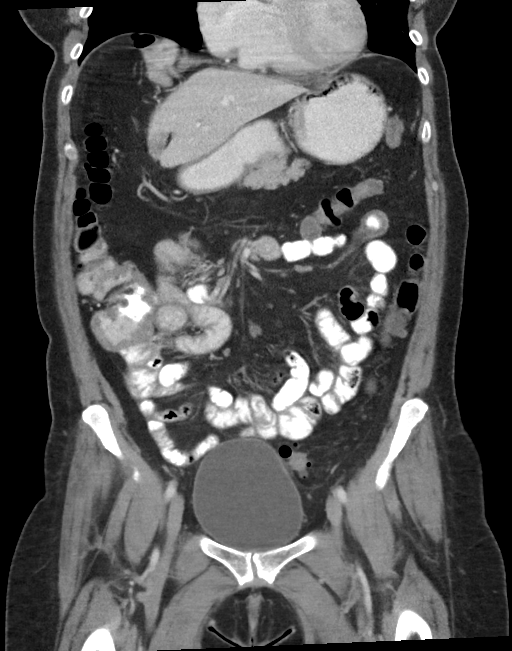
[im 35/78  soft-tissue]
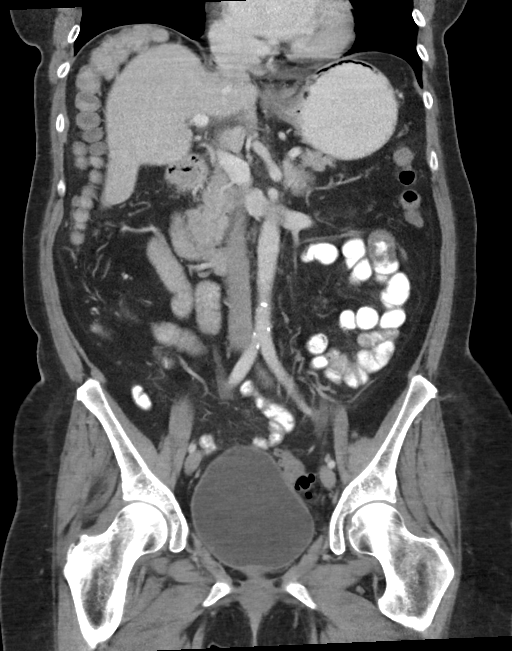
[im 43/78  soft-tissue]
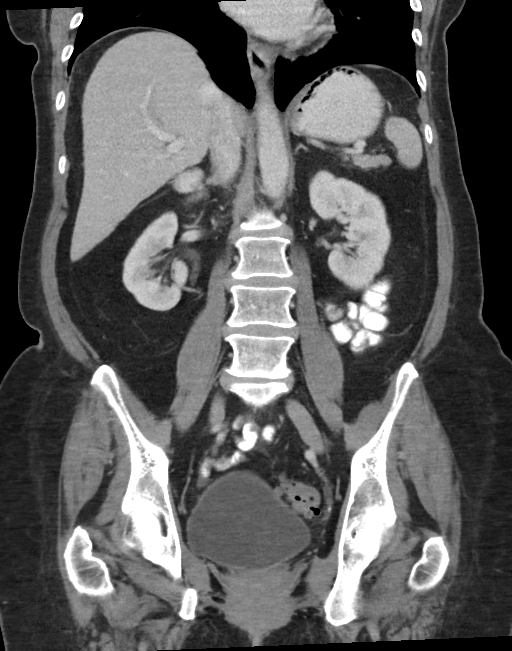

[16 of 46 positions shown; findings below may reference images not displayed]

FINDINGS: Lower chest: No acute abnormality.

Hepatobiliary: No focal liver abnormality is seen. Status post
cholecystectomy. No biliary dilatation.

Pancreas: Unremarkable. No pancreatic ductal dilatation or
surrounding inflammatory changes.

Spleen: Normal in size without focal abnormality.

Adrenals/Urinary Tract: Adrenal glands are unremarkable. Kidneys are
normal, without renal calculi, focal lesion, or hydronephrosis.
Bladder is unremarkable.

Stomach/Bowel: The stomach appears normal. There is no evidence of
bowel obstruction or inflammation. Status post appendectomy. Sigmoid
diverticulosis is noted without inflammation.

Vascular/Lymphatic: No significant vascular findings are present. No
enlarged abdominal or pelvic lymph nodes.

Reproductive: Status post hysterectomy. No adnexal masses.

Other: No abdominal wall hernia or abnormality. No abdominopelvic
ascites.

Musculoskeletal: No acute or significant osseous findings.
IMPRESSION: Sigmoid diverticulosis without inflammation. No acute abnormality
seen in the abdomen or pelvis.

## 2019-12-15 DIAGNOSIS — R2241 Localized swelling, mass and lump, right lower limb: Secondary | ICD-10-CM | POA: Diagnosis not present

## 2019-12-15 DIAGNOSIS — D2121 Benign neoplasm of connective and other soft tissue of right lower limb, including hip: Secondary | ICD-10-CM | POA: Diagnosis not present

## 2019-12-15 DIAGNOSIS — G629 Polyneuropathy, unspecified: Secondary | ICD-10-CM | POA: Diagnosis not present

## 2019-12-15 DIAGNOSIS — I872 Venous insufficiency (chronic) (peripheral): Secondary | ICD-10-CM | POA: Diagnosis not present

## 2019-12-15 DIAGNOSIS — M25571 Pain in right ankle and joints of right foot: Secondary | ICD-10-CM | POA: Diagnosis not present

## 2019-12-20 ENCOUNTER — Other Ambulatory Visit: Payer: Self-pay | Admitting: Podiatry

## 2019-12-20 DIAGNOSIS — R2241 Localized swelling, mass and lump, right lower limb: Secondary | ICD-10-CM

## 2019-12-31 ENCOUNTER — Other Ambulatory Visit: Payer: Self-pay

## 2019-12-31 ENCOUNTER — Ambulatory Visit
Admission: RE | Admit: 2019-12-31 | Discharge: 2019-12-31 | Disposition: A | Discharge status: Home / Self Care | Payer: BC Managed Care – PPO | Source: Ambulatory Visit | Attending: Podiatry | Admitting: Podiatry

## 2019-12-31 DIAGNOSIS — R2241 Localized swelling, mass and lump, right lower limb: Secondary | ICD-10-CM | POA: Insufficient documentation

## 2019-12-31 MED ORDER — GADOBUTROL 1 MMOL/ML IV SOLN
7.5000 mL | Freq: Once | INTRAVENOUS | Status: AC | PRN
  Administered 2019-12-31: 15:00:00 7.5 mL via INTRAVENOUS

## 2020-01-15 DIAGNOSIS — F418 Other specified anxiety disorders: Secondary | ICD-10-CM | POA: Diagnosis not present

## 2020-01-15 DIAGNOSIS — R079 Chest pain, unspecified: Secondary | ICD-10-CM | POA: Diagnosis not present

## 2020-01-15 DIAGNOSIS — I10 Essential (primary) hypertension: Secondary | ICD-10-CM | POA: Diagnosis not present

## 2020-01-15 DIAGNOSIS — E785 Hyperlipidemia, unspecified: Secondary | ICD-10-CM | POA: Diagnosis not present

## 2020-01-29 DIAGNOSIS — E039 Hypothyroidism, unspecified: Secondary | ICD-10-CM | POA: Diagnosis not present

## 2020-02-05 DIAGNOSIS — E039 Hypothyroidism, unspecified: Secondary | ICD-10-CM | POA: Diagnosis not present

## 2020-02-05 DIAGNOSIS — E040 Nontoxic diffuse goiter: Secondary | ICD-10-CM | POA: Diagnosis not present

## 2020-02-07 DIAGNOSIS — D2121 Benign neoplasm of connective and other soft tissue of right lower limb, including hip: Secondary | ICD-10-CM | POA: Diagnosis not present

## 2020-02-07 DIAGNOSIS — R2241 Localized swelling, mass and lump, right lower limb: Secondary | ICD-10-CM

## 2020-02-07 DIAGNOSIS — G629 Polyneuropathy, unspecified: Secondary | ICD-10-CM | POA: Diagnosis not present

## 2020-02-07 DIAGNOSIS — G5781 Other specified mononeuropathies of right lower limb: Secondary | ICD-10-CM | POA: Diagnosis not present

## 2020-02-07 HISTORY — DX: Localized swelling, mass and lump, right lower limb: R22.41

## 2020-02-08 ENCOUNTER — Other Ambulatory Visit: Payer: Self-pay | Admitting: Podiatry

## 2020-02-12 DIAGNOSIS — E039 Hypothyroidism, unspecified: Secondary | ICD-10-CM | POA: Diagnosis not present

## 2020-02-12 DIAGNOSIS — R2241 Localized swelling, mass and lump, right lower limb: Secondary | ICD-10-CM | POA: Diagnosis not present

## 2020-02-12 DIAGNOSIS — Z01818 Encounter for other preprocedural examination: Secondary | ICD-10-CM | POA: Diagnosis not present

## 2020-02-12 DIAGNOSIS — I10 Essential (primary) hypertension: Secondary | ICD-10-CM | POA: Diagnosis not present

## 2020-02-16 ENCOUNTER — Other Ambulatory Visit: Payer: Self-pay

## 2020-02-16 ENCOUNTER — Encounter
Admission: RE | Admit: 2020-02-16 | Discharge: 2020-02-16 | Disposition: A | Discharge status: Home / Self Care | Payer: BC Managed Care – PPO | Source: Ambulatory Visit | Attending: Podiatry | Admitting: Podiatry

## 2020-02-16 DIAGNOSIS — Z01818 Encounter for other preprocedural examination: Secondary | ICD-10-CM | POA: Insufficient documentation

## 2020-02-16 HISTORY — DX: Other specified disorders of bone density and structure, unspecified site: M85.80

## 2020-02-16 HISTORY — DX: Malignant (primary) neoplasm, unspecified: C80.1

## 2020-02-16 HISTORY — DX: Insomnia, unspecified: G47.00

## 2020-02-16 HISTORY — DX: Other secondary scoliosis, thoracic region: M41.54

## 2020-02-16 HISTORY — DX: Hypothyroidism, unspecified: E03.9

## 2020-02-16 HISTORY — DX: Constipation, unspecified: K59.00

## 2020-02-16 HISTORY — DX: Anxiety disorder, unspecified: F41.9

## 2020-02-16 HISTORY — DX: Pure hypercholesterolemia, unspecified: E78.00

## 2020-02-16 HISTORY — DX: Other forms of scoliosis, thoracic region: M41.84

## 2020-02-16 NOTE — Patient Instructions (Signed)
INSTRUCTIONS FOR SURGERY     Your surgery is scheduled for:  Tuesday, June 8TH     To find out your arrival time for the day of surgery,          please call [phone] between 1 pm and 3 pm on :  Monday, June 7TH     When you arrive for surgery, report to the [facility].       Do NOT stop on the first floor to register.    REMEMBER: Instructions that are not followed completely may result in serious medical risk,  up to and including death, or upon the discretion of your surgeon and anesthesiologist,            your surgery may need to be rescheduled.  __X__ 1. Do not eat food after midnight the night before your procedure.                    No gum, candy, lozenger, tic tacs, tums or hard candies.                  ABSOLUTELY NOTHING SOLID IN YOUR MOUTH AFTER MIDNIGHT                    You may drink unlimited clear liquids up to 2 hours before you are scheduled to arrive for surgery.                   Do not drink anything within those 2 hours unless you need to take medicine, then take the                   smallest amount you need.  Clear liquids include:  water, apple juice without pulp,                   any flavor Gatorade, Black coffee, black tea.  Sugar may be added but no dairy/ honey /lemon.                        Broth and jello is not considered a clear liquid.  __x__  2. On the morning of surgery, please brush your teeth with toothpaste and water. You may rinse with                  mouthwash if you wish but DO NOT SWALLOW TOOTHPASTE OR MOUTHWASH  __X___3. NO alcohol for 24 hours before or after surgery.  __x___ 4.  Do NOT smoke or use e-cigarettes for 24 HOURS PRIOR TO SURGERY.                      DO NOT Use any chewable tobacco products for at least 6 hours prior to surgery.  __x___ 5. If you start any new medication after this appointment and prior to surgery, please  Bring it with you on the day of surgery.  ___x__ 6. Notify your doctor if there is any change in your medical condition, such as fever,  infection, vomitting, diarrhea or any open sores.  __x___ 7.  USE the CHG SOAP as instructed, the night before surgery and the day of surgery.                   Once you have washed with this soap, do NOT use any of the following: Powders, perfumes                    or lotions. Please do not wear make up, hairpins, clips or nail polish. You MAY wear deodorant.                   Men may shave their face and neck.  Women need to shave 48 hours prior to surgery.                   DO NOT wear ANY jewelry on the day of surgery. If there are rings that are too tight to                    remove easily, please address this prior to the surgery day. Piercings need to be removed.                                                                     NO METAL ON YOUR BODY.                    Do NOT bring any valuables.  If you came to Pre-Admit testing then you will not need license,                     insurance card or credit card.  If you will be staying overnight, please either leave your things in                     the car or have your family be responsible for these items.                     [facility] IS NOT RESPONSIBLE FOR BELONGINGS OR VALUABLES.  ___X__ 8. DO NOT wear contact lenses on surgery day.  You may not have dentures,                     Hearing aides, contacts or glasses in the operating room. These items can be                    Placed in the Recovery Room to receive immediately after surgery.  __x___ 9. IF YOU ARE SCHEDULED TO GO HOME ON THE SAME DAY, YOU MUST                   Have someone to drive you home and to stay with you  for the first 24 hours.                    Have an arrangement prior to arriving on surgery day.  ___x__ 10. Take the following medications on the morning of surgery with a sip of water:  1. METOPROLOL                     2. SYNTHROID                     3. ATACAND                     4. VALACYCLOVIR, take if you have an outbreak                                  _X___ 54.  Follow any instructions provided to you by your surgeon.                        Such as enema, clear liquid bowel prep                     ##PLEASE DRINK AS MUCH AS YOU CAN OF THE PRESURGICAL                           CARBOHYDRATE DRINK. STOP DRINKING 2 HOURS PRIOR                             TO ARRIVAL TO [facility].##  __X__  12. STOP ALL ASPIRIN PRODUCTS AS OF:ONE WEEK PRIOR TO SURGERY.                        THIS INCLUDES BC POWDERS / GOODIES POWDER  __x___ 13. STOP Anti-inflammatories as of: ONE WEEK PRIOR TO SURGERY                      This includes IBUPROFEN / MOTRIN / ADVIL / ALEVE/ NAPROXYN //                             MELOXICAM                    YOU MAY TAKE TYLENOL ANY TIME PRIOR TO SURGERY.  ___X__ 14.  Stop supplements until after surgery. STOP ONE WEEK PRIOR TO SURGERY                     This includes: MULTIVITAMINS                  __X____17.  Continue to take the following medications but do not take on the morning of surgery:                           LASIX  ___X___18.  Wear clean and comfortable clothing to the hospital.                        LOOSE SHORTS WOULD BE BEST  PLEASE BRING PHONE NUMBERS FOR CONTACT PEOPLE IF YOU NEED TO HAVE YOUR CRUTCHES ADJUSTED TO A PROPER HEIGHT, THEN PLEASE     BRING THEM WITH YOU.

## 2020-02-23 ENCOUNTER — Other Ambulatory Visit
Admission: RE | Admit: 2020-02-23 | Discharge: 2020-02-23 | Disposition: A | Discharge status: Home / Self Care | Payer: BC Managed Care – PPO | Source: Ambulatory Visit | Attending: Podiatry | Admitting: Podiatry

## 2020-02-23 ENCOUNTER — Other Ambulatory Visit: Payer: Self-pay

## 2020-02-23 DIAGNOSIS — Z01812 Encounter for preprocedural laboratory examination: Secondary | ICD-10-CM | POA: Insufficient documentation

## 2020-02-23 DIAGNOSIS — Z20822 Contact with and (suspected) exposure to covid-19: Secondary | ICD-10-CM | POA: Insufficient documentation

## 2020-02-24 LAB — SARS CORONAVIRUS 2 (TAT 6-24 HRS): SARS Coronavirus 2: NEGATIVE

## 2020-02-27 ENCOUNTER — Ambulatory Visit: Payer: BC Managed Care – PPO | Admitting: Registered Nurse

## 2020-02-27 ENCOUNTER — Ambulatory Visit
Admission: RE | Admit: 2020-02-27 | Discharge: 2020-02-27 | Disposition: A | Discharge status: Home / Self Care | Payer: BC Managed Care – PPO | Attending: Podiatry | Admitting: Podiatry

## 2020-02-27 ENCOUNTER — Ambulatory Visit: Payer: BC Managed Care – PPO

## 2020-02-27 ENCOUNTER — Other Ambulatory Visit: Payer: Self-pay

## 2020-02-27 ENCOUNTER — Encounter
Admission: RE | Disposition: A | Discharge status: Home / Self Care | Payer: Self-pay | Source: Home / Self Care | Attending: Podiatry

## 2020-02-27 DIAGNOSIS — K5909 Other constipation: Secondary | ICD-10-CM | POA: Insufficient documentation

## 2020-02-27 DIAGNOSIS — Z880 Allergy status to penicillin: Secondary | ICD-10-CM | POA: Diagnosis not present

## 2020-02-27 DIAGNOSIS — N189 Chronic kidney disease, unspecified: Secondary | ICD-10-CM | POA: Diagnosis not present

## 2020-02-27 DIAGNOSIS — Z79899 Other long term (current) drug therapy: Secondary | ICD-10-CM | POA: Insufficient documentation

## 2020-02-27 DIAGNOSIS — G5781 Other specified mononeuropathies of right lower limb: Secondary | ICD-10-CM | POA: Diagnosis not present

## 2020-02-27 DIAGNOSIS — Z888 Allergy status to other drugs, medicaments and biological substances status: Secondary | ICD-10-CM | POA: Insufficient documentation

## 2020-02-27 DIAGNOSIS — Z882 Allergy status to sulfonamides status: Secondary | ICD-10-CM | POA: Diagnosis not present

## 2020-02-27 DIAGNOSIS — Z8551 Personal history of malignant neoplasm of bladder: Secondary | ICD-10-CM | POA: Diagnosis not present

## 2020-02-27 DIAGNOSIS — M25571 Pain in right ankle and joints of right foot: Secondary | ICD-10-CM | POA: Diagnosis not present

## 2020-02-27 DIAGNOSIS — E785 Hyperlipidemia, unspecified: Secondary | ICD-10-CM | POA: Diagnosis not present

## 2020-02-27 DIAGNOSIS — D2121 Benign neoplasm of connective and other soft tissue of right lower limb, including hip: Secondary | ICD-10-CM | POA: Diagnosis not present

## 2020-02-27 DIAGNOSIS — M1990 Unspecified osteoarthritis, unspecified site: Secondary | ICD-10-CM | POA: Insufficient documentation

## 2020-02-27 DIAGNOSIS — G629 Polyneuropathy, unspecified: Secondary | ICD-10-CM | POA: Diagnosis not present

## 2020-02-27 DIAGNOSIS — Z79890 Hormone replacement therapy (postmenopausal): Secondary | ICD-10-CM | POA: Insufficient documentation

## 2020-02-27 DIAGNOSIS — Z881 Allergy status to other antibiotic agents status: Secondary | ICD-10-CM | POA: Diagnosis not present

## 2020-02-27 DIAGNOSIS — I10 Essential (primary) hypertension: Secondary | ICD-10-CM | POA: Diagnosis not present

## 2020-02-27 DIAGNOSIS — I129 Hypertensive chronic kidney disease with stage 1 through stage 4 chronic kidney disease, or unspecified chronic kidney disease: Secondary | ICD-10-CM | POA: Diagnosis not present

## 2020-02-27 DIAGNOSIS — Z419 Encounter for procedure for purposes other than remedying health state, unspecified: Secondary | ICD-10-CM

## 2020-02-27 DIAGNOSIS — G8918 Other acute postprocedural pain: Secondary | ICD-10-CM | POA: Diagnosis not present

## 2020-02-27 DIAGNOSIS — R2241 Localized swelling, mass and lump, right lower limb: Secondary | ICD-10-CM | POA: Diagnosis not present

## 2020-02-27 HISTORY — PX: MASS EXCISION: SHX2000

## 2020-02-27 SURGERY — EXCISION MASS
Anesthesia: General | Site: Foot | Laterality: Right

## 2020-02-27 MED ORDER — ONDANSETRON HCL 4 MG PO TABS: 4.0000 mg | ORAL_TABLET | Freq: Three times a day (TID) | ORAL | 0 refills | Status: AC | PRN

## 2020-02-27 MED ORDER — PROPOFOL 10 MG/ML IV BOLUS
INTRAVENOUS | Status: AC
  Filled 2020-02-27: qty 20

## 2020-02-27 MED ORDER — PROMETHAZINE HCL 25 MG/ML IJ SOLN: 6.2500 mg | INTRAMUSCULAR | Status: DC | PRN

## 2020-02-27 MED ORDER — FENTANYL CITRATE (PF) 100 MCG/2ML IJ SOLN
INTRAMUSCULAR | Status: AC
  Administered 2020-02-27: 50 ug via INTRAVENOUS
  Filled 2020-02-27: qty 2

## 2020-02-27 MED ORDER — CLINDAMYCIN PHOSPHATE 900 MG/50ML IV SOLN
900.0000 mg | INTRAVENOUS | Status: AC
  Administered 2020-02-27: 900 mg via INTRAVENOUS

## 2020-02-27 MED ORDER — POVIDONE-IODINE 7.5 % EX SOLN
Freq: Once | CUTANEOUS | Status: DC
  Filled 2020-02-27: qty 118

## 2020-02-27 MED ORDER — CHLORHEXIDINE GLUCONATE 0.12 % MT SOLN
OROMUCOSAL | Status: AC
  Filled 2020-02-27: qty 15

## 2020-02-27 MED ORDER — DEXAMETHASONE SODIUM PHOSPHATE 10 MG/ML IJ SOLN
INTRAMUSCULAR | Status: AC
  Filled 2020-02-27: qty 1

## 2020-02-27 MED ORDER — LACTATED RINGERS IV SOLN: INTRAVENOUS | Status: DC

## 2020-02-27 MED ORDER — ACETAMINOPHEN 10 MG/ML IV SOLN
INTRAVENOUS | Status: DC | PRN
  Administered 2020-02-27: 1000 mg via INTRAVENOUS

## 2020-02-27 MED ORDER — ONDANSETRON HCL 4 MG/2ML IJ SOLN
INTRAMUSCULAR | Status: DC | PRN
  Administered 2020-02-27: 4 mg via INTRAVENOUS

## 2020-02-27 MED ORDER — ORAL CARE MOUTH RINSE: 15.0000 mL | Freq: Once | OROMUCOSAL | Status: AC

## 2020-02-27 MED ORDER — ACETAMINOPHEN 10 MG/ML IV SOLN
INTRAVENOUS | Status: AC
  Filled 2020-02-27: qty 100

## 2020-02-27 MED ORDER — FENTANYL CITRATE (PF) 100 MCG/2ML IJ SOLN: 50.0000 ug | Freq: Once | INTRAMUSCULAR | Status: AC

## 2020-02-27 MED ORDER — KETAMINE HCL 10 MG/ML IJ SOLN
INTRAMUSCULAR | Status: DC | PRN
Start: 2020-02-27 — End: 2020-02-27
  Administered 2020-02-27: 20 mg via INTRAVENOUS

## 2020-02-27 MED ORDER — FENTANYL CITRATE (PF) 100 MCG/2ML IJ SOLN
INTRAMUSCULAR | Status: DC | PRN
  Administered 2020-02-27: 50 ug via INTRAVENOUS

## 2020-02-27 MED ORDER — ONDANSETRON HCL 4 MG/2ML IJ SOLN
INTRAMUSCULAR | Status: AC
  Filled 2020-02-27: qty 2

## 2020-02-27 MED ORDER — GLYCOPYRROLATE 0.2 MG/ML IJ SOLN
INTRAMUSCULAR | Status: AC
  Filled 2020-02-27: qty 1

## 2020-02-27 MED ORDER — FAMOTIDINE 20 MG PO TABS
ORAL_TABLET | ORAL | Status: AC
  Filled 2020-02-27: qty 1

## 2020-02-27 MED ORDER — DEXMEDETOMIDINE HCL 200 MCG/2ML IV SOLN
INTRAVENOUS | Status: DC | PRN
  Administered 2020-02-27: 8 ug via INTRAVENOUS

## 2020-02-27 MED ORDER — GLYCOPYRROLATE 0.2 MG/ML IJ SOLN
INTRAMUSCULAR | Status: DC | PRN
  Administered 2020-02-27: .2 mg via INTRAVENOUS

## 2020-02-27 MED ORDER — MIDAZOLAM HCL 2 MG/2ML IJ SOLN: 1.0000 mg | Freq: Once | INTRAMUSCULAR | Status: AC

## 2020-02-27 MED ORDER — LIDOCAINE HCL (PF) 1 % IJ SOLN
INTRAMUSCULAR | Status: DC | PRN
  Administered 2020-02-27: 3 mL

## 2020-02-27 MED ORDER — SUGAMMADEX SODIUM 200 MG/2ML IV SOLN
INTRAVENOUS | Status: DC | PRN
  Administered 2020-02-27: 200 mg via INTRAVENOUS

## 2020-02-27 MED ORDER — ROCURONIUM BROMIDE 100 MG/10ML IV SOLN
INTRAVENOUS | Status: DC | PRN
  Administered 2020-02-27: 50 mg via INTRAVENOUS

## 2020-02-27 MED ORDER — ROCURONIUM BROMIDE 10 MG/ML (PF) SYRINGE
PREFILLED_SYRINGE | INTRAVENOUS | Status: AC
  Filled 2020-02-27: qty 10

## 2020-02-27 MED ORDER — FENTANYL CITRATE (PF) 100 MCG/2ML IJ SOLN
INTRAMUSCULAR | Status: AC
  Filled 2020-02-27: qty 2

## 2020-02-27 MED ORDER — MIDAZOLAM HCL 2 MG/2ML IJ SOLN
INTRAMUSCULAR | Status: AC
  Administered 2020-02-27: 1 mg via INTRAVENOUS
  Filled 2020-02-27: qty 2

## 2020-02-27 MED ORDER — DEXAMETHASONE SODIUM PHOSPHATE 10 MG/ML IJ SOLN
INTRAMUSCULAR | Status: DC | PRN
  Administered 2020-02-27: 8 mg via INTRAVENOUS

## 2020-02-27 MED ORDER — ROPIVACAINE HCL 5 MG/ML IJ SOLN
INTRAMUSCULAR | Status: DC | PRN
Start: 2020-02-27 — End: 2020-02-27
  Administered 2020-02-27: 30 mL via PERINEURAL

## 2020-02-27 MED ORDER — CLINDAMYCIN PHOSPHATE 900 MG/50ML IV SOLN
INTRAVENOUS | Status: AC
  Filled 2020-02-27: qty 50

## 2020-02-27 MED ORDER — 0.9 % SODIUM CHLORIDE (POUR BTL) OPTIME
TOPICAL | Status: DC | PRN
  Administered 2020-02-27: 500 mL

## 2020-02-27 MED ORDER — FAMOTIDINE 20 MG PO TABS
20.0000 mg | ORAL_TABLET | Freq: Once | ORAL | Status: AC
  Administered 2020-02-27: 20 mg via ORAL

## 2020-02-27 MED ORDER — EPHEDRINE SULFATE 50 MG/ML IJ SOLN
INTRAMUSCULAR | Status: DC | PRN
  Administered 2020-02-27: 10 mg via INTRAVENOUS

## 2020-02-27 MED ORDER — LIDOCAINE HCL (CARDIAC) PF 100 MG/5ML IV SOSY
PREFILLED_SYRINGE | INTRAVENOUS | Status: DC | PRN
  Administered 2020-02-27: 80 mg via INTRAVENOUS

## 2020-02-27 MED ORDER — EPHEDRINE 5 MG/ML INJ
INTRAVENOUS | Status: AC
  Filled 2020-02-27: qty 10

## 2020-02-27 MED ORDER — CHLORHEXIDINE GLUCONATE 0.12 % MT SOLN
15.0000 mL | Freq: Once | OROMUCOSAL | Status: AC
  Administered 2020-02-27: 15 mL via OROMUCOSAL

## 2020-02-27 MED ORDER — LIDOCAINE HCL (PF) 1 % IJ SOLN
INTRAMUSCULAR | Status: AC
  Filled 2020-02-27: qty 5

## 2020-02-27 MED ORDER — ROPIVACAINE HCL 5 MG/ML IJ SOLN
INTRAMUSCULAR | Status: AC
  Filled 2020-02-27: qty 30

## 2020-02-27 MED ORDER — HYDROCODONE-ACETAMINOPHEN 5-325 MG PO TABS: 1.0000 | ORAL_TABLET | Freq: Four times a day (QID) | ORAL | 0 refills | Status: AC | PRN

## 2020-02-27 MED ORDER — MEPERIDINE HCL 50 MG/ML IJ SOLN: 6.2500 mg | INTRAMUSCULAR | Status: DC | PRN

## 2020-02-27 MED ORDER — PROPOFOL 10 MG/ML IV BOLUS
INTRAVENOUS | Status: DC | PRN
  Administered 2020-02-27: 130 mg via INTRAVENOUS

## 2020-02-27 MED ORDER — FENTANYL CITRATE (PF) 100 MCG/2ML IJ SOLN: 25.0000 ug | INTRAMUSCULAR | Status: DC | PRN

## 2020-02-27 SURGICAL SUPPLY — 42 items
BENZOIN TINCTURE PRP APPL 2/3 (GAUZE/BANDAGES/DRESSINGS) ×3 IMPLANT
BNDG COHESIVE 4X5 TAN STRL (GAUZE/BANDAGES/DRESSINGS) ×3 IMPLANT
BNDG ELASTIC 4X5.8 VLCR STR LF (GAUZE/BANDAGES/DRESSINGS) ×3 IMPLANT
BNDG ESMARK 4X12 TAN STRL LF (GAUZE/BANDAGES/DRESSINGS) ×3 IMPLANT
BNDG GAUZE 4.5X4.1 6PLY STRL (MISCELLANEOUS) ×3 IMPLANT
BNDG STRETCH 4X75 STRL LF (GAUZE/BANDAGES/DRESSINGS) ×3 IMPLANT
CANISTER SUCT 1200ML W/VALVE (MISCELLANEOUS) ×3 IMPLANT
CUFF TOURN SGL QUICK 18X4 (TOURNIQUET CUFF) IMPLANT
DURAPREP 26ML APPLICATOR (WOUND CARE) ×3 IMPLANT
ELECT REM PT RETURN 9FT ADLT (ELECTROSURGICAL) ×3
ELECTRODE REM PT RTRN 9FT ADLT (ELECTROSURGICAL) ×2 IMPLANT
GAUZE SPONGE 4X4 12PLY STRL (GAUZE/BANDAGES/DRESSINGS) ×3 IMPLANT
GAUZE XEROFORM 1X8 LF (GAUZE/BANDAGES/DRESSINGS) ×3 IMPLANT
GLOVE BIO SURGEON STRL SZ7 (GLOVE) ×3 IMPLANT
GLOVE BIOGEL PI IND STRL 7.0 (GLOVE) ×2 IMPLANT
GLOVE BIOGEL PI INDICATOR 7.0 (GLOVE) ×1
GOWN STRL REUS W/ TWL LRG LVL3 (GOWN DISPOSABLE) ×4 IMPLANT
GOWN STRL REUS W/TWL LRG LVL3 (GOWN DISPOSABLE) ×6
KIT TURNOVER KIT A (KITS) ×3 IMPLANT
NS IRRIG 500ML POUR BTL (IV SOLUTION) ×3 IMPLANT
PACK EXTREMITY (MISCELLANEOUS) ×3 IMPLANT
PADDING CAST 4IN STRL (MISCELLANEOUS) ×1
PADDING CAST BLEND 4X4 STRL (MISCELLANEOUS) ×2 IMPLANT
PENCIL SMOKE EVACUATOR (MISCELLANEOUS) ×3 IMPLANT
STOCKINETTE IMPERVIOUS 9X36 MD (GAUZE/BANDAGES/DRESSINGS) ×3 IMPLANT
STRIP CLOSURE SKIN 1/4X4 (GAUZE/BANDAGES/DRESSINGS) ×3 IMPLANT
SUT ETHILON 4-0 (SUTURE) ×3
SUT ETHILON 4-0 FS2 18XMFL BLK (SUTURE) ×2
SUT ETHILON 5-0 FS-2 18 BLK (SUTURE) IMPLANT
SUT MNCRL 4-0 (SUTURE) ×3
SUT MNCRL 4-0 27XMFL (SUTURE) ×2
SUT MNCRL 5-0+ PC-1 (SUTURE) IMPLANT
SUT MONOCRYL 5-0 (SUTURE)
SUT VIC AB 0 CT1 36 (SUTURE) IMPLANT
SUT VIC AB 2-0 SH 27 (SUTURE)
SUT VIC AB 2-0 SH 27XBRD (SUTURE) IMPLANT
SUT VIC AB 3-0 SH 27 (SUTURE) ×3
SUT VIC AB 3-0 SH 27X BRD (SUTURE) ×2 IMPLANT
SUT VIC AB 4-0 FS2 27 (SUTURE) IMPLANT
SUT VICRYL AB 3-0 FS1 BRD 27IN (SUTURE) IMPLANT
SUTURE ETHLN 4-0 FS2 18XMF BLK (SUTURE) ×2 IMPLANT
SUTURE MNCRL 4-0 27XMF (SUTURE) ×2 IMPLANT

## 2020-02-27 NOTE — Discharge Instructions (Addendum)
[place]    YOU ARE PARTIAL WEIGHT BEARING (50% WIEGHT ONLY) TO YOUR OPERATIVE EXTREMITY [facility]  POST OPERATIVE INSTRUCTIONS FOR DR. Kay   1. Take your medication as prescribed.  Pain medication should be taken only as needed.  Take antinausea medication as prescribed.  2. Keep the dressing clean, dry and intact.  Remain in CAM boot at all times to the right foot and ankle.  Do not remove.  Ok to put some weight on the foot for transfers only but need to stay off of the foot as much as possible until incision is healed.  3. Keep your foot elevated above the heart level for the first 48 hours.  4. Do not take a shower. Baths are permissible as long as the foot is kept out of the water.   5. Every hour you are awake:  - Bend your knee 15 times. - Massage calf 15 times  6. Call [facility] [phone]) if any of the following problems occur: - You develop a temperature or fever. - The bandage becomes saturated with blood. - Medication does not stop your pain. - Injury of the foot occurs. - Any symptoms of infection including redness, odor, or red streaks running from wound.  AMBULATORY SURGERY  DISCHARGE INSTRUCTIONS   1) The drugs that you were given will stay in your system until tomorrow so for the next 24 hours you should not:  A) Drive an automobile B) Make any legal decisions C) Drink any alcoholic beverage   2) You may resume regular meals tomorrow.  Today it is better to start with liquids and gradually work up to solid foods.  You may eat anything you prefer, but it is better to start with liquids, then soup and crackers, and gradually work up to solid foods.   3) Please notify your doctor immediately if you have any unusual bleeding, trouble breathing, redness and pain at the surgery site, drainage, fever, or pain not relieved by medication.    4) Additional  Instructions:        Please contact your physician with any problems or Same Day Surgery at [phone], Monday through Friday 6 am to 4 pm, or [place] at [facility] number at [phone].

## 2020-02-27 NOTE — Anesthesia Postprocedure Evaluation (Signed)
Anesthesia Post Note  Patient: Gloria Bell  Procedure(s) Performed: EXCISION MASS RIGHT HEEL (Right Foot)  Patient location during evaluation: PACU Anesthesia Type: General Level of consciousness: awake and alert and oriented Pain management: pain level controlled Vital Signs Assessment: post-procedure vital signs reviewed and stable Respiratory status: spontaneous breathing, nonlabored ventilation and respiratory function stable Cardiovascular status: blood pressure returned to baseline and stable Postop Assessment: no signs of nausea or vomiting Anesthetic complications: no     Last Vitals:  Vitals:   02/27/20 1453 02/27/20 1513  BP: (!) 142/82 (!) 148/72  Pulse: 64 (!) 57  Resp: (!) 26 17  Temp:  36.4 C  SpO2: 99% 100%    Last Pain:  Vitals:   02/27/20 1513  TempSrc: Tympanic  PainSc: 0-No pain                 Dequann Vandervelden

## 2020-02-27 NOTE — Anesthesia Procedure Notes (Signed)
Procedure Name: Intubation Date/Time: 02/27/2020 12:43 PM Performed by: Lia Foyer, CRNA Pre-anesthesia Checklist: Patient identified, Emergency Drugs available, Suction available and Patient being monitored Patient Re-evaluated:Patient Re-evaluated prior to induction Oxygen Delivery Method: Circle system utilized Preoxygenation: Pre-oxygenation with 100% oxygen Induction Type: IV induction Ventilation: Mask ventilation without difficulty Laryngoscope Size: McGraph and 3 Grade View: Grade I Tube type: Oral Tube size: 7.0 mm Number of attempts: 1 Airway Equipment and Method: Stylet,  Oral airway and Video-laryngoscopy Placement Confirmation: ETT inserted through vocal cords under direct vision,  positive ETCO2 and breath sounds checked- equal and bilateral Secured at: 21 cm Tube secured with: Tape Dental Injury: Teeth and Oropharynx as per pre-operative assessment

## 2020-02-27 NOTE — Anesthesia Preprocedure Evaluation (Signed)
Anesthesia Evaluation  Patient identified by MRN, date of birth, ID band Patient awake    Reviewed: Allergy & Precautions, NPO status , Patient's Chart, lab work & pertinent test results  History of Anesthesia Complications Negative for: history of anesthetic complications  Airway Mallampati: II  TM Distance: >3 FB Neck ROM: Full    Dental  (+) Implants   Pulmonary neg pulmonary ROS, neg sleep apnea, neg COPD,    breath sounds clear to auscultation- rhonchi (-) wheezing      Cardiovascular Exercise Tolerance: Good hypertension, Pt. on medications (-) CAD, (-) Past MI, (-) Cardiac Stents and (-) CABG  Rhythm:Regular Rate:Normal - Systolic murmurs and - Diastolic murmurs    Neuro/Psych neg Seizures Anxiety negative neurological ROS     GI/Hepatic negative GI ROS, Neg liver ROS,   Endo/Other  neg diabetesHypothyroidism   Renal/GU negative Renal ROS     Musculoskeletal  (+) Arthritis ,   Abdominal (+) - obese,   Peds  Hematology negative hematology ROS (+)   Anesthesia Other Findings Past Medical History: 02/07/2020: Ankle mass, right     Comment:  lateral to achilles tendon No date: Anxiety No date: Arthritis No date: Cancer [facility])     Comment:  bladder No date: Chronic kidney disease     Comment:  Bladder Tumor No date: Constipation No date: Diverticulitis     Comment:  history of a bleed No date: Diverticulosis     Comment:  was on flagyl for 10 years. nerves to bowels were               injured during an old accident. frequent flareups. 09/2003: Fall from ladder 1987: History of kidney stones No date: Hypercholesteremia No date: Hyperlipidemia No date: Hypertension No date: Hypothyroidism No date: Insomnia No date: Osteoarthritis No date: Osteopenia     Comment:  of hip and spine No date: Osteoporosis No date: Scoliosis of thoracic region due to degenerative disease of  spine in adult   Reproductive/Obstetrics                             Anesthesia Physical Anesthesia Plan  ASA: III  Anesthesia Plan: General   Post-op Pain Management:  Regional for Post-op pain   Induction: Intravenous  PONV Risk Score and Plan: 2 and Dexamethasone, Ondansetron and Midazolam  Airway Management Planned: Oral ETT  Additional Equipment:   Intra-op Plan:   Post-operative Plan: Extubation in OR  Informed Consent: I have reviewed the patients History and Physical, chart, labs and discussed the procedure including the risks, benefits and alternatives for the proposed anesthesia with the patient or authorized representative who has indicated his/her understanding and acceptance.     Dental advisory given  Plan Discussed with: CRNA and Anesthesiologist  Anesthesia Plan Comments:         Anesthesia Quick Evaluation

## 2020-02-27 NOTE — Transfer of Care (Signed)
Immediate Anesthesia Transfer of Care Note  Patient: [place]  Procedure(s) Performed: EXCISION MASS RIGHT HEEL (Right Foot)  Patient Location: PACU  Anesthesia Type:General  Level of Consciousness: drowsy  Airway & Oxygen Therapy: Patient Spontanous Breathing and Patient connected to face mask oxygen  Post-op Assessment: Report given to RN and Post -op Vital signs reviewed and stable  Post vital signs: Reviewed and stable  Last Vitals:  Vitals Value Taken Time  BP 128/66 02/27/20 1338  Temp    Pulse 66 02/27/20 1341  Resp 17 02/27/20 1341  SpO2 100 % 02/27/20 1341  Vitals shown include unvalidated device data.  Last Pain:  Vitals:   02/27/20 1031  TempSrc: Tympanic  PainSc: 8          Complications: No apparent anesthesia complications

## 2020-02-27 NOTE — Op Note (Signed)
PODIATRY / FOOT AND ANKLE SURGERY OPERATIVE REPORT    SURGEON: Caroline More, DPM  PRE-OPERATIVE DIAGNOSIS:  1. Right posterior ankle soft tissue mass  POST-OPERATIVE DIAGNOSIS:  Same  PROCEDURE(S): 1. Excision of right posterior ankle soft tissue mass  HEMOSTASIS: ankle tourniquet  ANESTHESIA: MAC, preop popliteal and sap nerve block  ESTIMATED BLOOD LOSS: 10 cc  FINDING(S): 1.  Right posterior ankle soft tissue mass - appeared to be coming off of the small saphenous vein, no nerve involvement but pressing against the sural nerve  PATHOLOGY/SPECIMEN(S):  R posterior ankle soft tissue mass coming off of the small saphenous vein, round, firm measured 1.2 x 1.1cm  INDICATIONS:   Gloria Bell is a 69 y.o. female who presents with a soft tissue mass to the posterior aspect of the right ankle slightly lateral to the Achilles tendon.  Patient states that this mass has been present for a number of years but does seem to be getting larger but slowly over time.  Patient had an MRI performed which showed the mass of unknown origin.  Discussed with patient the need for surgical resection of mass with pathologic specimen.  Discussed that the mass could be related to the sural nerve or within the tendon sheath of the Achilles tendon.  Discussed that if it is involved with the sural nerve then we might have to perform a sural neurectomy with nerve transposition into muscle belly.  All treatment options were discussed with the patient both conservative and surgical attempts at correction including potential risks and complications.  At this time patient is elected for surgical procedure consisting of right ankle posterior soft tissue mass resection.  Discussed with patient prior to procedure number months ago the urgency of the situation as the mass has an unknown origin.  Patient throughout the procedure despite that discussion until today's date.  DESCRIPTION: After obtaining full informed  written consent, the patient was brought back to the operating room and placed supine upon the operating table.  The patient received IV antibiotics prior to induction.  Ankle tourniquet was applied.  Patient was placed in the prone position with the appropriate padding.  After obtaining adequate anesthesia, the patient was prepped and draped in the standard fashion.  An Esmarch bandage was used to exsanguinate the right lower extremity pneumatic ankle tourniquet was inflated.  Attention was then directed to the posterior aspect of the right ankle slightly lateral to the Achilles tendon where the palpable mass was able to be identified.  A 4 to 5 cm linear longitudinal incision was made over this area.  The incision was deepened to the subcutaneous tissues utilizing sharp and blunt dissection and care was taken to identify and retract all vital neurovascular structures and all venous contributories were cauterized necessary.  At this time the large circular nodular mass was identified and carefully dissected bluntly around its circumference.  Upon careful dissection it was noted that it was an out pocket coming from the small saphenous vein.  It did not appear to have any connections to the sural nerve or to the Achilles tendon.  The nodule appeared to be firm and slightly movable.  Bovie dissection was continued and the stalk of the cyst type subcutaneous soft tissue lesion was resected and the cystic type lesion was passed off the operative site and sent off for pathology.  The sural nerve was also identified during this process and noted to have no disease.  The surgical site was flushed with copious  amounts normal sterile saline.  The subcutaneous tissue was then reapproximated well coapted with 3-0 Monocryl and the skin was then reapproximated well coapted with 4-0 nylon horizontal mattress type stitching.  The pneumatic ankle tourniquet was deflated and a prompt hyperemic response was noted all digits of  the right foot and ankle.  A postoperative dressing was applied consisting of Xeroform followed by 4 x 4 gauze, Kerlix, web roll, Ace wrap.  Patient placed in a cam boot.  Patient to remain partial weightbearing for transfers only but try to stay off the foot until the incision heals.  Patient tolerated the procedure and anesthesia well was transferred to recovery room with vital signs stable vascular status intact to all toes of the right foot.  The patient was discharged with the appropriate follow-up information.  COMPLICATIONS: None  CONDITION: Good, stable  Caroline More, DPM

## 2020-02-27 NOTE — Anesthesia Procedure Notes (Signed)
Anesthesia Regional Block: Popliteal block   Pre-Anesthetic Checklist: ,, timeout performed, Correct Patient, Correct Site, Correct Laterality, Correct Procedure, Correct Position, site marked, Risks and benefits discussed,  Surgical consent,  Pre-op evaluation,  At surgeon's request and post-op pain management  Laterality: Right  Prep: chloraprep       Needles:  Injection technique: Single-shot  Needle Type: Stimiplex     Needle Length: 10cm  Needle Gauge: 21     Additional Needles:   Procedures:,,,, ultrasound used (permanent image in chart),,,,  Narrative:  Start time: 02/27/2020 12:05 PM End time: 02/27/2020 12:11 PM Injection made incrementally with aspirations every 5 mL.  Performed by: Personally  Anesthesiologist: Emmie Niemann, MD  Additional Notes: Functioning IV was confirmed and monitors were applied.  A Stimuplex needle was used. Sterile prep and drape,hand hygiene and sterile gloves were used.  Negative aspiration and negative test dose prior to incremental administration of local anesthetic. The patient tolerated the procedure well.

## 2020-02-27 NOTE — H&P (Signed)
HISTORY AND PHYSICAL INTERVAL NOTE:  02/27/2020  12:16 PM  Gloria Bell  has presented today for surgery, with the diagnosis of R22.41  ANKLE MASS, RIGHT.  The various methods of treatment have been discussed with the patient.  No guarantees were given.  After consideration of risks, benefits and other options for treatment, the patient has consented to surgery.  I have reviewed the patients' chart and labs.    PROCEDURE: EXCISION OF RIGHT POSTERIOR ANKLE MASS WITH POSSIBLE SURAL NEURECTOMY WITH TRANSPLANTATION   A history and physical examination was performed in my office.  The patient was reexamined.  There have been no changes to this history and physical examination.  Caroline More, DPM

## 2020-02-27 NOTE — OR Nursing (Signed)
Pt. C/0 discoloration to nail beds and hands. Color bluish. NO SOB or other complaints. Called DR. Penwarden and she came to evaluate pt. No issues and color improved.

## 2020-02-28 LAB — SURGICAL PATHOLOGY

## 2020-03-04 DIAGNOSIS — M25571 Pain in right ankle and joints of right foot: Secondary | ICD-10-CM | POA: Diagnosis not present

## 2020-04-05 DIAGNOSIS — T8131XA Disruption of external operation (surgical) wound, not elsewhere classified, initial encounter: Secondary | ICD-10-CM | POA: Diagnosis not present

## 2020-04-05 DIAGNOSIS — L03115 Cellulitis of right lower limb: Secondary | ICD-10-CM | POA: Diagnosis not present

## 2020-04-05 DIAGNOSIS — T8189XA Other complications of procedures, not elsewhere classified, initial encounter: Secondary | ICD-10-CM | POA: Diagnosis not present

## 2020-04-12 DIAGNOSIS — T8131XD Disruption of external operation (surgical) wound, not elsewhere classified, subsequent encounter: Secondary | ICD-10-CM | POA: Diagnosis not present

## 2020-04-26 DIAGNOSIS — I83013 Varicose veins of right lower extremity with ulcer of ankle: Secondary | ICD-10-CM | POA: Diagnosis not present

## 2020-04-26 DIAGNOSIS — L97319 Non-pressure chronic ulcer of right ankle with unspecified severity: Secondary | ICD-10-CM | POA: Diagnosis not present

## 2020-04-26 DIAGNOSIS — T8189XD Other complications of procedures, not elsewhere classified, subsequent encounter: Secondary | ICD-10-CM | POA: Diagnosis not present

## 2020-04-26 DIAGNOSIS — T8131XD Disruption of external operation (surgical) wound, not elsewhere classified, subsequent encounter: Secondary | ICD-10-CM | POA: Diagnosis not present

## 2020-04-30 DIAGNOSIS — E039 Hypothyroidism, unspecified: Secondary | ICD-10-CM | POA: Diagnosis not present

## 2020-04-30 DIAGNOSIS — E7800 Pure hypercholesterolemia, unspecified: Secondary | ICD-10-CM | POA: Diagnosis not present

## 2020-04-30 DIAGNOSIS — Z1331 Encounter for screening for depression: Secondary | ICD-10-CM | POA: Diagnosis not present

## 2020-04-30 DIAGNOSIS — G4700 Insomnia, unspecified: Secondary | ICD-10-CM | POA: Diagnosis not present

## 2020-04-30 DIAGNOSIS — I10 Essential (primary) hypertension: Secondary | ICD-10-CM | POA: Diagnosis not present

## 2020-05-10 DIAGNOSIS — I83013 Varicose veins of right lower extremity with ulcer of ankle: Secondary | ICD-10-CM | POA: Diagnosis not present

## 2020-05-10 DIAGNOSIS — G629 Polyneuropathy, unspecified: Secondary | ICD-10-CM | POA: Diagnosis not present

## 2020-05-10 DIAGNOSIS — T8131XD Disruption of external operation (surgical) wound, not elsewhere classified, subsequent encounter: Secondary | ICD-10-CM | POA: Diagnosis not present

## 2020-05-10 DIAGNOSIS — I872 Venous insufficiency (chronic) (peripheral): Secondary | ICD-10-CM | POA: Diagnosis not present

## 2020-05-20 DIAGNOSIS — M25571 Pain in right ankle and joints of right foot: Secondary | ICD-10-CM | POA: Diagnosis not present

## 2020-05-20 DIAGNOSIS — G629 Polyneuropathy, unspecified: Secondary | ICD-10-CM | POA: Diagnosis not present

## 2020-05-20 DIAGNOSIS — T8189XD Other complications of procedures, not elsewhere classified, subsequent encounter: Secondary | ICD-10-CM | POA: Diagnosis not present

## 2020-05-20 DIAGNOSIS — M25471 Effusion, right ankle: Secondary | ICD-10-CM | POA: Diagnosis not present

## 2020-05-20 DIAGNOSIS — L97319 Non-pressure chronic ulcer of right ankle with unspecified severity: Secondary | ICD-10-CM | POA: Diagnosis not present

## 2020-05-20 DIAGNOSIS — I83013 Varicose veins of right lower extremity with ulcer of ankle: Secondary | ICD-10-CM | POA: Diagnosis not present

## 2020-05-22 DIAGNOSIS — Z6829 Body mass index (BMI) 29.0-29.9, adult: Secondary | ICD-10-CM | POA: Diagnosis not present

## 2020-05-22 DIAGNOSIS — M26609 Unspecified temporomandibular joint disorder, unspecified side: Secondary | ICD-10-CM | POA: Diagnosis not present

## 2020-05-29 DIAGNOSIS — L97319 Non-pressure chronic ulcer of right ankle with unspecified severity: Secondary | ICD-10-CM | POA: Diagnosis not present

## 2020-05-29 DIAGNOSIS — I872 Venous insufficiency (chronic) (peripheral): Secondary | ICD-10-CM | POA: Diagnosis not present

## 2020-05-29 DIAGNOSIS — I83013 Varicose veins of right lower extremity with ulcer of ankle: Secondary | ICD-10-CM | POA: Diagnosis not present

## 2020-05-29 DIAGNOSIS — T8131XD Disruption of external operation (surgical) wound, not elsewhere classified, subsequent encounter: Secondary | ICD-10-CM | POA: Diagnosis not present

## 2020-05-30 DIAGNOSIS — Z1231 Encounter for screening mammogram for malignant neoplasm of breast: Secondary | ICD-10-CM | POA: Diagnosis not present

## 2020-06-05 ENCOUNTER — Other Ambulatory Visit: Payer: BC Managed Care – PPO | Admitting: Urology

## 2020-06-24 DIAGNOSIS — M25571 Pain in right ankle and joints of right foot: Secondary | ICD-10-CM | POA: Diagnosis not present

## 2020-06-24 DIAGNOSIS — I83893 Varicose veins of bilateral lower extremities with other complications: Secondary | ICD-10-CM | POA: Diagnosis not present

## 2020-06-24 DIAGNOSIS — M792 Neuralgia and neuritis, unspecified: Secondary | ICD-10-CM | POA: Diagnosis not present

## 2020-06-24 DIAGNOSIS — M25371 Other instability, right ankle: Secondary | ICD-10-CM | POA: Diagnosis not present

## 2020-06-24 NOTE — Progress Notes (Signed)
   06/25/2020  CC:  Chief Complaint  Patient presents with  . Cysto    HPI: Gloria Bell is a 69 y.o. female who returns for an annual surveillance cystoscopy.   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  Patient denies any new changes since the last visit in 05/2019.   Denies hematuria.   Blood pressure 135/82, pulse 69, height 5\' 6"  (1.676 m), weight 178 lb (80.7 kg). NED. A&Ox3.   No respiratory distress   Abd soft, NT, ND Normal external genitalia with patent urethral meatus  Cystoscopy Procedure Note  Patient identification was confirmed, informed consent was obtained, and patient was prepped using Betadine solution.  Lidocaine jelly was administered per urethral meatus.    Procedure: - Flexible cystoscope introduced, without any difficulty.   - Thorough search of the bladder revealed:    normal urethral meatus with stellate scare on the left posterior bladder wall    normal urothelium    no stones    no ulcers     no tumors    no urethral polyps    no trabeculation  - Ureteral orifices were normal in position and appearance.  Post-Procedure: - Patient tolerated the procedure well  Assessment/ Plan:  1. History of bladder cancer History of 1 cm low-grade Ta TCC s/p TURBT on 02/2017. NED today. Continue annual cystoscopy.     Fransico Him, am acting as a scribe for Dr. Hollice Espy.  I have reviewed the above documentation for accuracy and completeness, and I agree with the above.   Hollice Espy, MD

## 2020-06-25 ENCOUNTER — Ambulatory Visit (INDEPENDENT_AMBULATORY_CARE_PROVIDER_SITE_OTHER): Payer: BC Managed Care – PPO | Admitting: Urology

## 2020-06-25 ENCOUNTER — Other Ambulatory Visit: Payer: Self-pay

## 2020-06-25 VITALS — BP 135/82 | HR 69 | Ht 66.0 in | Wt 178.0 lb

## 2020-06-25 DIAGNOSIS — Z8551 Personal history of malignant neoplasm of bladder: Secondary | ICD-10-CM

## 2020-06-25 LAB — MICROSCOPIC EXAMINATION: Bacteria, UA: NONE SEEN

## 2020-06-25 LAB — URINALYSIS, COMPLETE
Bilirubin, UA: NEGATIVE
Glucose, UA: NEGATIVE
Ketones, UA: NEGATIVE
Leukocytes,UA: NEGATIVE
Nitrite, UA: NEGATIVE
Protein,UA: NEGATIVE
RBC, UA: NEGATIVE
Specific Gravity, UA: <1.005 — ABNORMAL LOW (ref 1.005–1.030)
Urobilinogen, Ur: 0.2 mg/dL (ref 0.2–1.0)
pH, UA: 5.5 (ref 5.0–7.5)

## 2020-07-17 DIAGNOSIS — I10 Essential (primary) hypertension: Secondary | ICD-10-CM | POA: Diagnosis not present

## 2020-07-17 DIAGNOSIS — G629 Polyneuropathy, unspecified: Secondary | ICD-10-CM | POA: Diagnosis not present

## 2020-07-17 DIAGNOSIS — E785 Hyperlipidemia, unspecified: Secondary | ICD-10-CM | POA: Diagnosis not present

## 2020-07-17 DIAGNOSIS — I83813 Varicose veins of bilateral lower extremities with pain: Secondary | ICD-10-CM | POA: Diagnosis not present

## 2020-07-21 DIAGNOSIS — Z79890 Hormone replacement therapy (postmenopausal): Secondary | ICD-10-CM | POA: Diagnosis not present

## 2020-07-21 DIAGNOSIS — R1032 Left lower quadrant pain: Secondary | ICD-10-CM | POA: Diagnosis not present

## 2020-07-21 DIAGNOSIS — K5732 Diverticulitis of large intestine without perforation or abscess without bleeding: Secondary | ICD-10-CM | POA: Diagnosis not present

## 2020-07-21 DIAGNOSIS — I10 Essential (primary) hypertension: Secondary | ICD-10-CM | POA: Diagnosis not present

## 2020-07-21 DIAGNOSIS — E785 Hyperlipidemia, unspecified: Secondary | ICD-10-CM | POA: Diagnosis not present

## 2020-07-21 DIAGNOSIS — R109 Unspecified abdominal pain: Secondary | ICD-10-CM | POA: Diagnosis not present

## 2020-07-21 DIAGNOSIS — Z6828 Body mass index (BMI) 28.0-28.9, adult: Secondary | ICD-10-CM | POA: Diagnosis not present

## 2020-07-21 DIAGNOSIS — K625 Hemorrhage of anus and rectum: Secondary | ICD-10-CM | POA: Diagnosis not present

## 2020-07-21 DIAGNOSIS — E039 Hypothyroidism, unspecified: Secondary | ICD-10-CM | POA: Diagnosis not present

## 2020-07-21 DIAGNOSIS — K921 Melena: Secondary | ICD-10-CM | POA: Diagnosis not present

## 2020-08-05 DIAGNOSIS — E538 Deficiency of other specified B group vitamins: Secondary | ICD-10-CM | POA: Diagnosis not present

## 2020-08-05 DIAGNOSIS — E039 Hypothyroidism, unspecified: Secondary | ICD-10-CM | POA: Diagnosis not present

## 2020-08-05 DIAGNOSIS — G629 Polyneuropathy, unspecified: Secondary | ICD-10-CM | POA: Diagnosis not present

## 2020-08-28 DIAGNOSIS — K5732 Diverticulitis of large intestine without perforation or abscess without bleeding: Secondary | ICD-10-CM | POA: Diagnosis not present

## 2020-08-28 DIAGNOSIS — K649 Unspecified hemorrhoids: Secondary | ICD-10-CM | POA: Diagnosis not present

## 2020-08-28 DIAGNOSIS — K5730 Diverticulosis of large intestine without perforation or abscess without bleeding: Secondary | ICD-10-CM | POA: Diagnosis not present

## 2020-09-04 ENCOUNTER — Other Ambulatory Visit: Payer: Self-pay | Admitting: Gastroenterology

## 2020-09-04 DIAGNOSIS — K5792 Diverticulitis of intestine, part unspecified, without perforation or abscess without bleeding: Secondary | ICD-10-CM

## 2020-09-04 DIAGNOSIS — K5721 Diverticulitis of large intestine with perforation and abscess with bleeding: Secondary | ICD-10-CM | POA: Diagnosis not present

## 2020-09-04 DIAGNOSIS — K649 Unspecified hemorrhoids: Secondary | ICD-10-CM | POA: Diagnosis not present

## 2020-09-04 DIAGNOSIS — K56699 Other intestinal obstruction unspecified as to partial versus complete obstruction: Secondary | ICD-10-CM | POA: Diagnosis not present

## 2020-09-05 ENCOUNTER — Other Ambulatory Visit: Payer: Self-pay | Admitting: Gastroenterology

## 2020-09-05 DIAGNOSIS — K5792 Diverticulitis of intestine, part unspecified, without perforation or abscess without bleeding: Secondary | ICD-10-CM

## 2020-09-20 DIAGNOSIS — R072 Precordial pain: Secondary | ICD-10-CM | POA: Diagnosis not present

## 2020-09-20 DIAGNOSIS — R6884 Jaw pain: Secondary | ICD-10-CM | POA: Diagnosis not present

## 2020-09-20 DIAGNOSIS — R079 Chest pain, unspecified: Secondary | ICD-10-CM | POA: Diagnosis not present

## 2020-09-20 DIAGNOSIS — Z5321 Procedure and treatment not carried out due to patient leaving prior to being seen by health care provider: Secondary | ICD-10-CM | POA: Diagnosis not present

## 2020-09-20 DIAGNOSIS — R202 Paresthesia of skin: Secondary | ICD-10-CM | POA: Diagnosis not present

## 2020-09-20 DIAGNOSIS — I499 Cardiac arrhythmia, unspecified: Secondary | ICD-10-CM | POA: Diagnosis not present

## 2020-09-22 DIAGNOSIS — Z20822 Contact with and (suspected) exposure to covid-19: Secondary | ICD-10-CM | POA: Diagnosis not present

## 2020-09-30 DIAGNOSIS — I10 Essential (primary) hypertension: Secondary | ICD-10-CM | POA: Diagnosis not present

## 2020-09-30 DIAGNOSIS — F418 Other specified anxiety disorders: Secondary | ICD-10-CM | POA: Diagnosis not present

## 2020-09-30 DIAGNOSIS — R079 Chest pain, unspecified: Secondary | ICD-10-CM | POA: Diagnosis not present

## 2020-09-30 DIAGNOSIS — R0602 Shortness of breath: Secondary | ICD-10-CM | POA: Diagnosis not present

## 2020-10-04 ENCOUNTER — Inpatient Hospital Stay: Admission: RE | Admit: 2020-10-04 | Payer: BC Managed Care – PPO | Source: Ambulatory Visit

## 2020-10-04 DIAGNOSIS — K5792 Diverticulitis of intestine, part unspecified, without perforation or abscess without bleeding: Secondary | ICD-10-CM | POA: Diagnosis not present

## 2020-10-04 DIAGNOSIS — E7800 Pure hypercholesterolemia, unspecified: Secondary | ICD-10-CM | POA: Diagnosis not present

## 2020-10-04 DIAGNOSIS — I10 Essential (primary) hypertension: Secondary | ICD-10-CM | POA: Diagnosis not present

## 2020-10-04 DIAGNOSIS — R110 Nausea: Secondary | ICD-10-CM | POA: Diagnosis not present

## 2020-10-15 DIAGNOSIS — Z0000 Encounter for general adult medical examination without abnormal findings: Secondary | ICD-10-CM | POA: Diagnosis not present

## 2020-10-15 DIAGNOSIS — Z9181 History of falling: Secondary | ICD-10-CM | POA: Diagnosis not present

## 2020-10-15 DIAGNOSIS — R7309 Other abnormal glucose: Secondary | ICD-10-CM | POA: Diagnosis not present

## 2020-10-15 DIAGNOSIS — Z6828 Body mass index (BMI) 28.0-28.9, adult: Secondary | ICD-10-CM | POA: Diagnosis not present

## 2020-11-26 DIAGNOSIS — R110 Nausea: Secondary | ICD-10-CM | POA: Diagnosis not present

## 2020-11-26 DIAGNOSIS — K5792 Diverticulitis of intestine, part unspecified, without perforation or abscess without bleeding: Secondary | ICD-10-CM | POA: Diagnosis not present

## 2020-11-26 DIAGNOSIS — Z6827 Body mass index (BMI) 27.0-27.9, adult: Secondary | ICD-10-CM | POA: Diagnosis not present

## 2020-12-06 ENCOUNTER — Other Ambulatory Visit: Payer: BC Managed Care – PPO

## 2020-12-16 ENCOUNTER — Ambulatory Visit
Admission: RE | Admit: 2020-12-16 | Discharge: 2020-12-16 | Disposition: A | Discharge status: Home / Self Care | Payer: BC Managed Care – PPO | Source: Ambulatory Visit | Attending: Gastroenterology | Admitting: Gastroenterology

## 2020-12-16 DIAGNOSIS — K635 Polyp of colon: Secondary | ICD-10-CM | POA: Diagnosis not present

## 2020-12-16 DIAGNOSIS — K5792 Diverticulitis of intestine, part unspecified, without perforation or abscess without bleeding: Secondary | ICD-10-CM

## 2020-12-16 DIAGNOSIS — K5732 Diverticulitis of large intestine without perforation or abscess without bleeding: Secondary | ICD-10-CM | POA: Diagnosis not present

## 2020-12-18 DIAGNOSIS — K5792 Diverticulitis of intestine, part unspecified, without perforation or abscess without bleeding: Secondary | ICD-10-CM | POA: Diagnosis not present

## 2020-12-24 ENCOUNTER — Other Ambulatory Visit: Payer: Self-pay | Admitting: Urology

## 2021-01-31 ENCOUNTER — Ambulatory Visit: Payer: Self-pay | Admitting: Surgery

## 2021-01-31 NOTE — H&P (Signed)
CC: Follow-up with colorectal surgery regarding diverticulitis  Ms. Gloria Bell is a very pleasant 70 year old female who presents for follow-up evaluation. She has a history dating back at least 10 years of diverticulitis. She reports that everything became a problem after she was on prolonged narcotics following trauma and pneumothorax orthopedics surgeries in the early 2000. Over the last 10 years she is average between 4 and 5 attacks of diverticulitis. This has significantly impacted her quality of life. With each of these attacks she describes severe left lower quadrant abdominal pain that is like a deep and strong cramp the last 4 days. She reports numerous courses of antibiotics being prescribed for these flares. The frequency of attacks has become increasingly problematic now happening more than there weren't the past.   She had a CT scan that per report showed acute diverticulitis of the descending colon with a small intramural abscess, but we do not have the actual images available to [place]. She was stable and afebrile and was discharged home with a course of oral antibiotics. The patient reports she has had 4-5 episodes of diverticulitis over the last 15 years. She has had known extensive sigmoid diverticulosis for many years and previously discussed colon resection with Dr. Marlou Starks but ultimately decided not to undergo surgery.  She has possible dysmotility and takes Miralax daily in order to have regular bowel movements - this being confounded by significant history of multiply recurrent sigmoid diverticulitis. In the past she has not been able to have a full colonoscopy due to inability to pass the sigmoid colon, thought to be due to ?adhesive disease. She has been getting virtual colonoscopies  Last virtual was 12/16/2020 - this demonstrated a 6 x 13 candidate polyp in the distal ascending colon. No other concerning polyps or masses. She was noted to have extensive wall thickening and  diverticulosis in the sigmoid colon with associated underdistention of just this segment of colon.  CT 07/21/20 - she brings copy today of imaging - shows diverticulitis involving the distal descending colon with intramural abscess area and severe sigmoid diverticulosis. We're able to review these images today.  Currently, she denies any abdominal pain, nausea or vomiting, fever or chills. She does have a history of hemorrhoids and has undergone hemorrhoidectomy before. She has struggled with constipation as well as recurring issues with some bright red blood per rectum associated with firm hard stool.  PMH: recurrent diverticulitis, hypothyroid, hypertension, HLD, kidney stones, transitional cell carcinoma of bladder  PSH: TURBT, ovarian cystectomy with pelvic adhesion removal? She denies any history of radiation treatment  FHx: Denies FHx of colorectal, breast, endometrial, ovarian or cervical cancer  Social: Denies use of tobacco/EtOH/drugs. She reports she is happily retired  ROS: A comprehensive 10 system review of systems was completed with the patient and pertinent findings as noted above.  The patient is a 70 year old female.   Allergies Gloria Forehand, CNA; 12/18/2020 2:48 PM) Ampicillin *PENICILLINS*  Cephalexin *CEPHALOSPORINS*  Ciprofloxacin *CHEMICALS*  Codeine Phosphate *ANALGESICS - OPIOID*  Flagyl *ANTI-INFECTIVE AGENTS - MISC.*  OxyCODONE HCl *ANALGESICS - OPIOID*  Sulfa 10 *OPHTHALMIC AGENTS*  Vicodin *ANALGESICS - OPIOID*  Allergies Reconciled   Medication History Gloria Forehand, CNA; 12/18/2020 2:48 PM) Simvastatin (40MG  Tablet, Oral) Active. Furosemide (40MG  Tablet, Oral) Active. Metoprolol Succinate ER (100MG  Tablet ER 24HR, Oral) Active. Ambien (10MG  Tablet, Oral) Active. Candesartan Cilexetil (16MG  Tablet, Oral two times daily) Active. Medications Reconciled    Review of Systems Gloria Gave M. Kura Bethards MD; 12/18/2020  3:30 PM) General  Present- Fatigue. Not Present- Appetite Loss, Chills, Fever, Night Sweats, Weight Gain and Weight Loss. Skin Present- Dryness. Not Present- Change in Wart/Mole, Hives, Jaundice, New Lesions, Non-Healing Wounds, Rash and Ulcer. HEENT Present- Ringing in the Ears, Seasonal Allergies and Wears glasses/contact lenses. Not Present- Earache, Hearing Loss, Hoarseness, Nose Bleed, Oral Ulcers, Sinus Pain, Sore Throat, Visual Disturbances and Yellow Eyes. Respiratory Not Present- Bloody sputum, Chronic Cough, Difficulty Breathing, Snoring and Wheezing. Breast Not Present- Breast Mass, Breast Pain, Nipple Discharge and Skin Changes. Cardiovascular Present- Swelling of Extremities. Not Present- Chest Pain, Difficulty Breathing Lying Down, Leg Cramps, Palpitations, Rapid Heart Rate and Shortness of Breath. Gastrointestinal Present- Abdominal Pain and Hemorrhoids. Not Present- Bloating, Bloody Stool, Change in Bowel Habits, Chronic diarrhea, Constipation, Difficulty Swallowing, Excessive gas, Gets full quickly at meals, Indigestion, Nausea, Rectal Pain and Vomiting. Musculoskeletal Present- Back Pain and Swelling of Extremities. Not Present- Joint Pain, Joint Stiffness, Muscle Pain and Muscle Weakness. Neurological Present- Tingling. Not Present- Decreased Memory, Fainting, Headaches, Numbness, Seizures, Tremor, Trouble walking and Weakness. Psychiatric Not Present- Anxiety, Bipolar, Change in Sleep Pattern, Depression, Fearful and Frequent crying. Endocrine Present- Hair Changes. Not Present- Cold Intolerance, Excessive Hunger, Heat Intolerance, Hot flashes and New Diabetes. Hematology Not Present- Blood Thinners, Easy Bruising, Excessive bleeding, Gland problems, HIV and Persistent Infections.   Physical Exam Gloria Gave M. Janmarie Smoot MD; 12/18/2020 3:32 PM) The physical exam findings are as follows: Note: Constitutional: No acute distress; conversant; no deformities; wearing mask Eyes: Moist conjunctiva; no lid  lag; anicteric sclerae; pupils equal and round Neck: Trachea midline; no palpable thyromegaly Lungs: Normal respiratory effort; no tactile fremitus CV: rrr; no palpable thrill; no pitting edema GI: Abdomen soft, nontender, nondistended; no palpable hepatosplenomegaly or masses Anorectal: She declined anorectal evaluation. We discussed importance and she acknowledged this. MSK: Normal gait; no clubbing/cyanosis Psychiatric: Appropriate affect; alert and oriented 3 Lymphatic: No palpable cervical or axillary lymphadenopathy    Assessment & Plan Gloria Gave M. Carnisha Feltz MD; 12/18/2020 3:36 PM) DIVERTICULITIS (K57.92) Story: Ms. Aurie Harroun is a very pleasant 26yoF with hx of multiply recurrent attacks of diverticulitis including CT confirmed attacks - CT 07/21/20 - intramural abscess in distal descending colon; CT virtual colonoscopy 12/16/20 - significant chronic appearing wall thickening and non-distendable sigmoid; possible polyp in ascending colon  -She has been told she is not a candidate for colonoscopy due to her current anatomy; would plan though for this in the months following surgery to evaluate her ascending colon -She specifically denies any tissue prolapse from her anus; solely occasional BRBPR. Nothing that she reports she has to reduce  -The anatomy and physiology of the GI tract was discussed at length with her today. The pathophysiology of diverticulitis was discussed at length with associated pictures. -We reviewed options going forward. I do think should benefit from sigmoid colectomy given the significant detriments she describes with regards to quality of life due to her frequent recurring attacks that are now occurring more frequently. She is highly interested in pursuing surgery to reduce her risk for recurrent diverticulitis. -We discussed robotic and potential open techniques to sigmoid colectomy. Possible takedown of splenic flexure. Flexible sigmoidoscopy. -[facility]  for ureteral ICG/firefly given chronicity of her symptoms as well as her history of possible pelvic "adhesions." -The planned procedures, material risks (including, but not limited to, pain, bleeding, infection, scarring, need for blood transfusion, damage to surrounding structures- blood vessels/nerves/viscus/organs, damage to ureter, urine leak, leak from anastomosis, need for additional procedures, worsening of  pre-existing medical conditions, need for stoma which may be permanent, hernia formation, recurrence, DVT/PE, pneumonia, heart attack, stroke, death) benefits and alternatives to surgery were discussed at length. We noted a good probability that the procedure would help improve her symptoms through reduction of recurrence risks. The patient's questions were answered to her satisfaction, she voiced understanding and elected to proceed with surgery. Additionally, we discussed typical postoperative expectations and the recovery process.  This patient encounter took 65 minutes today to perform the following: take history, perform exam, review outside records, interpret imaging, counsel the patient on their diagnosis and document encounter, findings & plan in the EHR  Signed by Ileana Roup, MD (12/18/2020 3:37 PM)

## 2021-01-31 NOTE — Progress Notes (Signed)
DUE TO COVID-19 ONLY ONE VISITOR IS ALLOWED TO COME WITH YOU AND STAY IN THE WAITING ROOM ONLY DURING PRE OP AND PROCEDURE DAY OF SURGERY. THE 1 VISITOR  MAY VISIT WITH YOU AFTER SURGERY IN YOUR PRIVATE ROOM DURING VISITING HOURS ONLY!  YOU NEED TO HAVE A COVID 19 TEST ON___5/23/2022 ____ @_______ , THIS TEST MUST BE DONE BEFORE SURGERY,  COVID TESTING SITE [address] [place] [address], IT IS ON THE RIGHT GOING OUT [address] APPROXIMATELY  2 MINUTES PAST [employer] ON THE RIGHT. ONCE YOUR COVID TEST IS COMPLETED,  PLEASE BEGIN THE QUARANTINE INSTRUCTIONS AS OUTLINED IN YOUR HANDOUT.                Gloria Bell  01/31/2021   Your procedure is scheduled on:  02/12/21  Report to [facility] Main  Entrance   Report to admitting at     1100 AM     Call this number if you have problems the morning of surgery [phone]           REMEMBER: FOLLOW ALL YOUR BOWEL PREP INSTRUCTIONS WITH CLEAR LIQUIDS FROM [place]. DRINK 2 PRESURGERY ENSURE DRINKS THE NIGHT BEFORE SURGERY AT 1000 PM AND 1 PRESURGERY DRINK THE DAY OF THE PROCEDURE 3 HOURS PRIOR TO SCHEDULED SURGERY.  NOTHING BY MOUTH EXCEPT CLEAR LIQUIDS UNTIL THREE HOURS PRIOR TO SCHEDULED SURGERY. PLEASE FINISH PRESURGERY 3RD  ENSURE  DRINK PER SURGEON ORDER 3 HOURS PRIOR TO SCHEDULED SURGERY TIME WHICH NEEDS TO BE COMPLETED AT ___1000am ______.     CLEAR LIQUID DIET   Foods Allowed                                                                      Coffee and tea, regular and decaf                           Plain Jell-O any favor except red or purple                                         Fruit ices (not with fruit pulp)                                      Iced Popsicles                                     Carbonated beverages, regular and diet                                    Cranberry, grape and apple juices Sports drinks like Gatorade Lightly seasoned clear broth or  consume(fat free) Sugar, honey syrup  _____________________________________________________________________      BRUSH YOUR TEETH MORNING OF SURGERY AND RINSE YOUR MOUTH OUT, NO CHEWING GUM CANDY OR MINTS.     Take  these medicines the morning of surgery with A SIP OF WATER: toprol, synthroid   DO NOT TAKE ANY DIABETIC MEDICATIONS DAY OF YOUR SURGERY                               You may not have any metal on your body including hair pins and              piercings  Do not wear jewelry, make-up, lotions, powders or perfumes, deodorant             Do not wear nail polish on your fingernails.  Do not shave  48 hours prior to surgery.              Men may shave face and neck.   Do not bring valuables to the hospital. [place].  Contacts, dentures or bridgework may not be worn into surgery.  Leave suitcase in the car. After surgery it may be brought to your room.                 Please read over the following fact sheets you were given: _____________________________________________________________________  [facility] - Preparing for Surgery Before surgery, you can play an important role.  Because skin is not sterile, your skin needs to be as free of germs as possible.  You can reduce the number of germs on your skin by washing with CHG (chlorahexidine gluconate) soap before surgery.  CHG is an antiseptic cleaner which kills germs and bonds with the skin to continue killing germs even after washing. Please DO NOT use if you have an allergy to CHG or antibacterial soaps.  If your skin becomes reddened/irritated stop using the CHG and inform your nurse when you arrive at Short Stay. Do not shave (including legs and underarms) for at least 48 hours prior to the first CHG shower.  You may shave your face/neck. Please follow these instructions carefully:  1.  Shower with CHG Soap the night before surgery and the  morning of Surgery.  2.  If  you choose to wash your hair, wash your hair first as usual with your  normal  shampoo.  3.  After you shampoo, rinse your hair and body thoroughly to remove the  shampoo.                           4.  Use CHG as you would any other liquid soap.  You can apply chg directly  to the skin and wash                       Gently with a scrungie or clean washcloth.  5.  Apply the CHG Soap to your body ONLY FROM THE NECK DOWN.   Do not use on face/ open                           Wound or open sores. Avoid contact with eyes, ears mouth and genitals (private parts).                       Wash face,  Genitals (private parts) with your normal soap.  6.  Wash thoroughly, paying special attention to the area where your surgery  will be performed.  7.  Thoroughly rinse your body with warm water from the neck down.  8.  DO NOT shower/wash with your normal soap after using and rinsing off  the CHG Soap.                9.  Pat yourself dry with a clean towel.            10.  Wear clean pajamas.            11.  Place clean sheets on your bed the night of your first shower and do not  sleep with pets. Day of Surgery : Do not apply any lotions/deodorants the morning of surgery.  Please wear clean clothes to the hospital/surgery center.  FAILURE TO FOLLOW THESE INSTRUCTIONS MAY RESULT IN THE CANCELLATION OF YOUR SURGERY PATIENT SIGNATURE_________________________________  NURSE SIGNATURE__________________________________  ________________________________________________________________________

## 2021-02-04 ENCOUNTER — Encounter (INDEPENDENT_AMBULATORY_CARE_PROVIDER_SITE_OTHER): Payer: Self-pay

## 2021-02-04 ENCOUNTER — Encounter (HOSPITAL_COMMUNITY)
Admission: RE | Admit: 2021-02-04 | Discharge: 2021-02-04 | Disposition: A | Discharge status: Home / Self Care | Payer: BC Managed Care – PPO | Source: Ambulatory Visit | Attending: Surgery | Admitting: Surgery

## 2021-02-04 ENCOUNTER — Other Ambulatory Visit: Payer: Self-pay

## 2021-02-04 ENCOUNTER — Encounter (HOSPITAL_COMMUNITY): Payer: Self-pay

## 2021-02-04 DIAGNOSIS — Z01818 Encounter for other preprocedural examination: Secondary | ICD-10-CM | POA: Insufficient documentation

## 2021-02-04 HISTORY — DX: Other specified postprocedural states: Z98.890

## 2021-02-04 HISTORY — DX: Nausea with vomiting, unspecified: R11.2

## 2021-02-04 LAB — COMPREHENSIVE METABOLIC PANEL
ALT: 23 U/L (ref 0–44)
AST: 19 U/L (ref 15–41)
Albumin: 4.6 g/dL (ref 3.5–5.0)
Alkaline Phosphatase: 44 U/L (ref 38–126)
Anion gap: 7 (ref 5–15)
BUN: 12 mg/dL (ref 8–23)
CO2: 29 mmol/L (ref 22–32)
Calcium: 9.6 mg/dL (ref 8.9–10.3)
Chloride: 105 mmol/L (ref 98–111)
Creatinine, Ser: 0.62 mg/dL (ref 0.44–1.00)
GFR, Estimated: >60 mL/min (ref >60)
Glucose, Bld: 109 mg/dL — ABNORMAL HIGH (ref 70–99)
Potassium: 4 mmol/L (ref 3.5–5.1)
Sodium: 141 mmol/L (ref 135–145)
Total Bilirubin: 0.7 mg/dL (ref 0.3–1.2)
Total Protein: 7.1 g/dL (ref 6.5–8.1)

## 2021-02-04 LAB — CBC WITH DIFFERENTIAL/PLATELET
Abs Immature Granulocytes: 0.02 10*3/uL (ref 0.00–0.07)
Basophils Absolute: 0 10*3/uL (ref 0.0–0.1)
Basophils Relative: 1 %
Eosinophils Absolute: 0.2 10*3/uL (ref 0.0–0.5)
Eosinophils Relative: 3 %
HCT: 42.8 % (ref 36.0–46.0)
Hemoglobin: 14.3 g/dL (ref 12.0–15.0)
Immature Granulocytes: 0 %
Lymphocytes Relative: 34 %
Lymphs Abs: 2.2 10*3/uL (ref 0.7–4.0)
MCH: 30.9 pg (ref 26.0–34.0)
MCHC: 33.4 g/dL (ref 30.0–36.0)
MCV: 92.4 fL (ref 80.0–100.0)
Monocytes Absolute: 0.5 10*3/uL (ref 0.1–1.0)
Monocytes Relative: 7 %
Neutro Abs: 3.6 10*3/uL (ref 1.7–7.7)
Neutrophils Relative %: 55 %
Platelets: 217 10*3/uL (ref 150–400)
RBC: 4.63 MIL/uL (ref 3.87–5.11)
RDW: 12.2 % (ref 11.5–15.5)
WBC: 6.5 10*3/uL (ref 4.0–10.5)
nRBC: 0 % (ref 0.0–0.2)

## 2021-02-04 LAB — HEMOGLOBIN A1C
Hgb A1c MFr Bld: 5.7 % — ABNORMAL HIGH (ref 4.8–5.6)
Mean Plasma Glucose: 116.89 mg/dL

## 2021-02-04 NOTE — Progress Notes (Addendum)
Anesthesia Review:  PCP: Jeani Hawking lamb in [place] and requested clearance and most recent office visit note  11/26/20 LOV NOTE ON CHART CLEARANCE DATED 12/24/20 ON CHART  Cardiologist : DR Bartholome Bill at [place] 09/30/20-epic  [phone] requested most recent 12 lekad ekg tracing, most recent echo, stress test, etc if available.  Chest x-ray : EKG :02/04/2021  Echo : Stress test: Cardiac Cath :  Activity level: cannot do a flight of stairs due to knees  Sleep Study/ CPAP : none  Fasting Blood Sugar :      / Checks Blood Sugar -- times a day:   Blood Thinner/ Instructions /Last Dose: ASA / Instructions/ Last Dose :  hgba1c-02/04/21- 5.7 - routed to Dr Dema Severin on 02/05/21  LVMM at [facility] in regards to the following:  1. - pt not aware of urology procedure being done at same time as gi surgery.  2. Gator ade prep per pt made her have elevated blood pressure with previous preps 3. Requested clearance from dr Jeani Hawking lamb be faxed to [phone] Triage Abigail Butts) called me back and stated she had received my message regarding all of the above .  Abigail Butts stated she would have DR Dema Severin to call pt .

## 2021-02-10 ENCOUNTER — Other Ambulatory Visit (HOSPITAL_COMMUNITY)
Admission: RE | Admit: 2021-02-10 | Discharge: 2021-02-10 | Disposition: A | Discharge status: Home / Self Care | Payer: BC Managed Care – PPO | Source: Ambulatory Visit | Attending: Surgery | Admitting: Surgery

## 2021-02-10 DIAGNOSIS — M810 Age-related osteoporosis without current pathological fracture: Secondary | ICD-10-CM | POA: Diagnosis not present

## 2021-02-10 DIAGNOSIS — G4700 Insomnia, unspecified: Secondary | ICD-10-CM | POA: Diagnosis not present

## 2021-02-10 DIAGNOSIS — K625 Hemorrhage of anus and rectum: Secondary | ICD-10-CM | POA: Diagnosis present

## 2021-02-10 DIAGNOSIS — E039 Hypothyroidism, unspecified: Secondary | ICD-10-CM | POA: Diagnosis not present

## 2021-02-10 DIAGNOSIS — L573 Poikiloderma of Civatte: Secondary | ICD-10-CM | POA: Diagnosis not present

## 2021-02-10 DIAGNOSIS — Z01812 Encounter for preprocedural laboratory examination: Secondary | ICD-10-CM | POA: Insufficient documentation

## 2021-02-10 DIAGNOSIS — E785 Hyperlipidemia, unspecified: Secondary | ICD-10-CM | POA: Diagnosis not present

## 2021-02-10 DIAGNOSIS — Z20822 Contact with and (suspected) exposure to covid-19: Secondary | ICD-10-CM | POA: Insufficient documentation

## 2021-02-10 DIAGNOSIS — N736 Female pelvic peritoneal adhesions (postinfective): Secondary | ICD-10-CM | POA: Diagnosis not present

## 2021-02-10 DIAGNOSIS — Z881 Allergy status to other antibiotic agents status: Secondary | ICD-10-CM | POA: Diagnosis not present

## 2021-02-10 DIAGNOSIS — Z9851 Tubal ligation status: Secondary | ICD-10-CM | POA: Diagnosis not present

## 2021-02-10 DIAGNOSIS — K5732 Diverticulitis of large intestine without perforation or abscess without bleeding: Secondary | ICD-10-CM | POA: Diagnosis not present

## 2021-02-10 DIAGNOSIS — K5730 Diverticulosis of large intestine without perforation or abscess without bleeding: Secondary | ICD-10-CM | POA: Diagnosis not present

## 2021-02-10 DIAGNOSIS — I10 Essential (primary) hypertension: Secondary | ICD-10-CM | POA: Diagnosis not present

## 2021-02-10 DIAGNOSIS — Z882 Allergy status to sulfonamides status: Secondary | ICD-10-CM | POA: Diagnosis not present

## 2021-02-10 DIAGNOSIS — F419 Anxiety disorder, unspecified: Secondary | ICD-10-CM | POA: Diagnosis not present

## 2021-02-10 DIAGNOSIS — Z90710 Acquired absence of both cervix and uterus: Secondary | ICD-10-CM | POA: Diagnosis not present

## 2021-02-10 DIAGNOSIS — Z8551 Personal history of malignant neoplasm of bladder: Secondary | ICD-10-CM | POA: Diagnosis not present

## 2021-02-10 DIAGNOSIS — Z9049 Acquired absence of other specified parts of digestive tract: Secondary | ICD-10-CM | POA: Diagnosis not present

## 2021-02-10 DIAGNOSIS — Z87442 Personal history of urinary calculi: Secondary | ICD-10-CM | POA: Diagnosis not present

## 2021-02-10 DIAGNOSIS — E7800 Pure hypercholesterolemia, unspecified: Secondary | ICD-10-CM | POA: Diagnosis not present

## 2021-02-10 DIAGNOSIS — B359 Dermatophytosis, unspecified: Secondary | ICD-10-CM | POA: Diagnosis not present

## 2021-02-10 DIAGNOSIS — Z8249 Family history of ischemic heart disease and other diseases of the circulatory system: Secondary | ICD-10-CM | POA: Diagnosis not present

## 2021-02-10 DIAGNOSIS — Z79890 Hormone replacement therapy (postmenopausal): Secondary | ICD-10-CM | POA: Diagnosis not present

## 2021-02-10 DIAGNOSIS — K635 Polyp of colon: Secondary | ICD-10-CM | POA: Diagnosis not present

## 2021-02-10 DIAGNOSIS — Z79899 Other long term (current) drug therapy: Secondary | ICD-10-CM | POA: Diagnosis not present

## 2021-02-10 DIAGNOSIS — Z888 Allergy status to other drugs, medicaments and biological substances status: Secondary | ICD-10-CM | POA: Diagnosis not present

## 2021-02-10 LAB — SARS CORONAVIRUS 2 (TAT 6-24 HRS): SARS Coronavirus 2: NEGATIVE

## 2021-02-11 MED ORDER — BUPIVACAINE LIPOSOME 1.3 % IJ SUSP
20.0000 mL | INTRAMUSCULAR | Status: DC
  Filled 2021-02-11: qty 20

## 2021-02-11 MED ORDER — GENTAMICIN SULFATE 40 MG/ML IJ SOLN
5.0000 mg/kg | INTRAVENOUS | Status: AC
  Administered 2021-02-12: 330 mg via INTRAVENOUS
  Filled 2021-02-11: qty 8.25

## 2021-02-12 ENCOUNTER — Inpatient Hospital Stay (HOSPITAL_COMMUNITY): Payer: BC Managed Care – PPO | Admitting: Certified Registered Nurse Anesthetist

## 2021-02-12 ENCOUNTER — Encounter (HOSPITAL_COMMUNITY): Payer: Self-pay | Admitting: Surgery

## 2021-02-12 ENCOUNTER — Inpatient Hospital Stay (HOSPITAL_COMMUNITY): Payer: BC Managed Care – PPO | Admitting: Physician Assistant

## 2021-02-12 ENCOUNTER — Encounter (HOSPITAL_COMMUNITY)
Admission: RE | Disposition: A | Discharge status: Home / Self Care | Payer: Self-pay | Source: Home / Self Care | Attending: Surgery

## 2021-02-12 ENCOUNTER — Inpatient Hospital Stay (HOSPITAL_COMMUNITY)
Admission: RE | Admit: 2021-02-12 | Discharge: 2021-02-15 | DRG: 331 | Disposition: A | Discharge status: Home / Self Care | Payer: BC Managed Care – PPO | Attending: Surgery | Admitting: Surgery

## 2021-02-12 DIAGNOSIS — Z79890 Hormone replacement therapy (postmenopausal): Secondary | ICD-10-CM | POA: Diagnosis not present

## 2021-02-12 DIAGNOSIS — Z79899 Other long term (current) drug therapy: Secondary | ICD-10-CM

## 2021-02-12 DIAGNOSIS — K5732 Diverticulitis of large intestine without perforation or abscess without bleeding: Secondary | ICD-10-CM | POA: Diagnosis present

## 2021-02-12 DIAGNOSIS — Z881 Allergy status to other antibiotic agents status: Secondary | ICD-10-CM

## 2021-02-12 DIAGNOSIS — M810 Age-related osteoporosis without current pathological fracture: Secondary | ICD-10-CM | POA: Diagnosis present

## 2021-02-12 DIAGNOSIS — Z20822 Contact with and (suspected) exposure to covid-19: Secondary | ICD-10-CM | POA: Diagnosis present

## 2021-02-12 DIAGNOSIS — K635 Polyp of colon: Secondary | ICD-10-CM | POA: Diagnosis present

## 2021-02-12 DIAGNOSIS — I10 Essential (primary) hypertension: Secondary | ICD-10-CM | POA: Diagnosis present

## 2021-02-12 DIAGNOSIS — Z8249 Family history of ischemic heart disease and other diseases of the circulatory system: Secondary | ICD-10-CM | POA: Diagnosis not present

## 2021-02-12 DIAGNOSIS — K625 Hemorrhage of anus and rectum: Secondary | ICD-10-CM | POA: Diagnosis present

## 2021-02-12 DIAGNOSIS — B359 Dermatophytosis, unspecified: Secondary | ICD-10-CM | POA: Diagnosis present

## 2021-02-12 DIAGNOSIS — F419 Anxiety disorder, unspecified: Secondary | ICD-10-CM | POA: Diagnosis present

## 2021-02-12 DIAGNOSIS — Z87442 Personal history of urinary calculi: Secondary | ICD-10-CM

## 2021-02-12 DIAGNOSIS — Z9049 Acquired absence of other specified parts of digestive tract: Secondary | ICD-10-CM

## 2021-02-12 DIAGNOSIS — Z90710 Acquired absence of both cervix and uterus: Secondary | ICD-10-CM

## 2021-02-12 DIAGNOSIS — Z8551 Personal history of malignant neoplasm of bladder: Secondary | ICD-10-CM

## 2021-02-12 DIAGNOSIS — G4700 Insomnia, unspecified: Secondary | ICD-10-CM | POA: Diagnosis present

## 2021-02-12 DIAGNOSIS — E7800 Pure hypercholesterolemia, unspecified: Secondary | ICD-10-CM | POA: Diagnosis present

## 2021-02-12 DIAGNOSIS — Z9851 Tubal ligation status: Secondary | ICD-10-CM

## 2021-02-12 DIAGNOSIS — E039 Hypothyroidism, unspecified: Secondary | ICD-10-CM | POA: Diagnosis present

## 2021-02-12 DIAGNOSIS — N736 Female pelvic peritoneal adhesions (postinfective): Secondary | ICD-10-CM | POA: Diagnosis present

## 2021-02-12 DIAGNOSIS — Z888 Allergy status to other drugs, medicaments and biological substances status: Secondary | ICD-10-CM

## 2021-02-12 DIAGNOSIS — E785 Hyperlipidemia, unspecified: Secondary | ICD-10-CM | POA: Diagnosis present

## 2021-02-12 DIAGNOSIS — Z882 Allergy status to sulfonamides status: Secondary | ICD-10-CM

## 2021-02-12 HISTORY — PX: FLEXIBLE SIGMOIDOSCOPY: SHX5431

## 2021-02-12 LAB — TYPE AND SCREEN
ABO/RH(D): A POS
Antibody Screen: NEGATIVE

## 2021-02-12 SURGERY — COLECTOMY, SIGMOID, ROBOT-ASSISTED
Anesthesia: General | Site: Ureter

## 2021-02-12 MED ORDER — HYDROMORPHONE HCL 2 MG/ML IJ SOLN
INTRAMUSCULAR | Status: AC
  Filled 2021-02-12: qty 1

## 2021-02-12 MED ORDER — BUPIVACAINE-EPINEPHRINE (PF) 0.25% -1:200000 IJ SOLN
INTRAMUSCULAR | Status: AC
  Filled 2021-02-12: qty 30

## 2021-02-12 MED ORDER — ROCURONIUM BROMIDE 10 MG/ML (PF) SYRINGE
PREFILLED_SYRINGE | INTRAVENOUS | Status: DC | PRN
  Administered 2021-02-12: 60 mg via INTRAVENOUS
  Administered 2021-02-12: 40 mg via INTRAVENOUS
  Administered 2021-02-12: 20 mg via INTRAVENOUS

## 2021-02-12 MED ORDER — ONDANSETRON HCL 4 MG PO TABS: 4.0000 mg | ORAL_TABLET | Freq: Four times a day (QID) | ORAL | Status: DC | PRN

## 2021-02-12 MED ORDER — STERILE WATER FOR INJECTION IJ SOLN
INTRAMUSCULAR | Status: AC
  Filled 2021-02-12: qty 10

## 2021-02-12 MED ORDER — HEPARIN SODIUM (PORCINE) 5000 UNIT/ML IJ SOLN
5000.0000 [IU] | Freq: Once | INTRAMUSCULAR | Status: AC
  Administered 2021-02-12: 5000 [IU] via SUBCUTANEOUS
  Filled 2021-02-12: qty 1

## 2021-02-12 MED ORDER — EPHEDRINE SULFATE-NACL 50-0.9 MG/10ML-% IV SOSY
PREFILLED_SYRINGE | INTRAVENOUS | Status: DC | PRN
  Administered 2021-02-12: 10 mg via INTRAVENOUS
  Administered 2021-02-12: 5 mg via INTRAVENOUS

## 2021-02-12 MED ORDER — LABETALOL HCL 5 MG/ML IV SOLN
INTRAVENOUS | Status: DC | PRN
  Administered 2021-02-12 (×2): 5 mg via INTRAVENOUS

## 2021-02-12 MED ORDER — DEXAMETHASONE SODIUM PHOSPHATE 10 MG/ML IJ SOLN
INTRAMUSCULAR | Status: AC
  Filled 2021-02-12: qty 1

## 2021-02-12 MED ORDER — ENSURE PRE-SURGERY PO LIQD
296.0000 mL | Freq: Once | ORAL | Status: DC
  Filled 2021-02-12: qty 296

## 2021-02-12 MED ORDER — KETAMINE HCL 10 MG/ML IJ SOLN
INTRAMUSCULAR | Status: DC | PRN
  Administered 2021-02-12: 30 mg via INTRAVENOUS

## 2021-02-12 MED ORDER — BISACODYL 5 MG PO TBEC: 20.0000 mg | DELAYED_RELEASE_TABLET | Freq: Once | ORAL | Status: DC

## 2021-02-12 MED ORDER — ALVIMOPAN 12 MG PO CAPS
12.0000 mg | ORAL_CAPSULE | ORAL | Status: AC
  Administered 2021-02-12: 12 mg via ORAL
  Filled 2021-02-12: qty 1

## 2021-02-12 MED ORDER — LIDOCAINE 2% (20 MG/ML) 5 ML SYRINGE
INTRAMUSCULAR | Status: AC
  Filled 2021-02-12: qty 5

## 2021-02-12 MED ORDER — MIDAZOLAM HCL 5 MG/5ML IJ SOLN
INTRAMUSCULAR | Status: DC | PRN
  Administered 2021-02-12: 2 mg via INTRAVENOUS

## 2021-02-12 MED ORDER — ENSURE PRE-SURGERY PO LIQD
592.0000 mL | Freq: Once | ORAL | Status: DC
  Filled 2021-02-12: qty 592

## 2021-02-12 MED ORDER — SCOPOLAMINE 1 MG/3DAYS TD PT72: 1.0000 | MEDICATED_PATCH | TRANSDERMAL | Status: DC

## 2021-02-12 MED ORDER — HEPARIN SODIUM (PORCINE) 5000 UNIT/ML IJ SOLN
5000.0000 [IU] | Freq: Three times a day (TID) | INTRAMUSCULAR | Status: DC
  Administered 2021-02-13: 5000 [IU] via SUBCUTANEOUS
  Filled 2021-02-12: qty 1

## 2021-02-12 MED ORDER — ZOLPIDEM TARTRATE 5 MG PO TABS: 2.5000 mg | ORAL_TABLET | Freq: Every evening | ORAL | Status: DC | PRN

## 2021-02-12 MED ORDER — 0.9 % SODIUM CHLORIDE (POUR BTL) OPTIME
TOPICAL | Status: DC | PRN
  Administered 2021-02-12: 2000 mL

## 2021-02-12 MED ORDER — DEXAMETHASONE SODIUM PHOSPHATE 10 MG/ML IJ SOLN
INTRAMUSCULAR | Status: DC | PRN
  Administered 2021-02-12: 8 mg via INTRAVENOUS

## 2021-02-12 MED ORDER — SUCCINYLCHOLINE CHLORIDE 200 MG/10ML IV SOSY
PREFILLED_SYRINGE | INTRAVENOUS | Status: AC
  Filled 2021-02-12: qty 10

## 2021-02-12 MED ORDER — LEVOTHYROXINE SODIUM 50 MCG PO TABS
50.0000 ug | ORAL_TABLET | Freq: Every morning | ORAL | Status: DC
  Administered 2021-02-13 – 2021-02-15 (×3): 50 ug via ORAL
  Filled 2021-02-12 (×4): qty 1

## 2021-02-12 MED ORDER — SUGAMMADEX SODIUM 200 MG/2ML IV SOLN
INTRAVENOUS | Status: DC | PRN
  Administered 2021-02-12: 200 mg via INTRAVENOUS

## 2021-02-12 MED ORDER — LACTATED RINGERS IV SOLN: INTRAVENOUS | Status: DC

## 2021-02-12 MED ORDER — ONDANSETRON HCL 4 MG/2ML IJ SOLN
4.0000 mg | Freq: Four times a day (QID) | INTRAMUSCULAR | Status: DC | PRN
  Administered 2021-02-12: 4 mg via INTRAVENOUS
  Filled 2021-02-12 (×2): qty 2

## 2021-02-12 MED ORDER — PROPOFOL 10 MG/ML IV BOLUS
INTRAVENOUS | Status: DC | PRN
  Administered 2021-02-12: 100 mg via INTRAVENOUS
  Administered 2021-02-12: 50 mg via INTRAVENOUS

## 2021-02-12 MED ORDER — HYDRALAZINE HCL 20 MG/ML IJ SOLN: 10.0000 mg | INTRAMUSCULAR | Status: DC | PRN

## 2021-02-12 MED ORDER — PROPOFOL 10 MG/ML IV BOLUS
INTRAVENOUS | Status: AC
  Filled 2021-02-12: qty 20

## 2021-02-12 MED ORDER — LIDOCAINE HCL (PF) 2 % IJ SOLN
INTRAMUSCULAR | Status: DC | PRN
  Administered 2021-02-12: 1 mg/kg/h via INTRADERMAL

## 2021-02-12 MED ORDER — METOPROLOL SUCCINATE ER 50 MG PO TB24
100.0000 mg | ORAL_TABLET | Freq: Two times a day (BID) | ORAL | Status: DC
  Administered 2021-02-12 – 2021-02-15 (×6): 100 mg via ORAL
  Filled 2021-02-12: qty 1
  Filled 2021-02-12: qty 2
  Filled 2021-02-12: qty 1
  Filled 2021-02-12: qty 2
  Filled 2021-02-12 (×2): qty 1

## 2021-02-12 MED ORDER — HYDROMORPHONE HCL 1 MG/ML IJ SOLN: 0.5000 mg | INTRAMUSCULAR | Status: DC | PRN

## 2021-02-12 MED ORDER — INDOCYANINE GREEN 25 MG IV SOLR
INTRAVENOUS | Status: DC | PRN
  Administered 2021-02-12: 7.5 mg via INTRAVENOUS

## 2021-02-12 MED ORDER — ENSURE SURGERY PO LIQD
237.0000 mL | Freq: Two times a day (BID) | ORAL | Status: DC
  Administered 2021-02-15: 237 mL via ORAL
  Filled 2021-02-12 (×4): qty 237

## 2021-02-12 MED ORDER — ROCURONIUM BROMIDE 10 MG/ML (PF) SYRINGE
PREFILLED_SYRINGE | INTRAVENOUS | Status: AC
  Filled 2021-02-12: qty 10

## 2021-02-12 MED ORDER — FENTANYL CITRATE (PF) 250 MCG/5ML IJ SOLN
INTRAMUSCULAR | Status: DC | PRN
  Administered 2021-02-12: 100 ug via INTRAVENOUS
  Administered 2021-02-12: 150 ug via INTRAVENOUS

## 2021-02-12 MED ORDER — PROMETHAZINE HCL 25 MG/ML IJ SOLN: 6.2500 mg | INTRAMUSCULAR | Status: DC | PRN

## 2021-02-12 MED ORDER — MIDAZOLAM HCL 2 MG/2ML IJ SOLN
INTRAMUSCULAR | Status: AC
  Filled 2021-02-12: qty 2

## 2021-02-12 MED ORDER — TRAMADOL HCL 50 MG PO TABS
50.0000 mg | ORAL_TABLET | Freq: Four times a day (QID) | ORAL | Status: DC | PRN
  Administered 2021-02-12: 50 mg via ORAL
  Filled 2021-02-12: qty 1

## 2021-02-12 MED ORDER — CHLORHEXIDINE GLUCONATE CLOTH 2 % EX PADS: 6.0000 | MEDICATED_PAD | Freq: Once | CUTANEOUS | Status: DC

## 2021-02-12 MED ORDER — IRBESARTAN 150 MG PO TABS
150.0000 mg | ORAL_TABLET | Freq: Every day | ORAL | Status: DC
  Administered 2021-02-13 – 2021-02-15 (×3): 150 mg via ORAL
  Filled 2021-02-12 (×3): qty 1

## 2021-02-12 MED ORDER — HEPARIN SODIUM (PORCINE) 5000 UNIT/ML IJ SOLN: 5000.0000 [IU] | Freq: Three times a day (TID) | INTRAMUSCULAR | Status: DC

## 2021-02-12 MED ORDER — ALUM & MAG HYDROXIDE-SIMETH 200-200-20 MG/5ML PO SUSP: 30.0000 mL | Freq: Four times a day (QID) | ORAL | Status: DC | PRN

## 2021-02-12 MED ORDER — DIPHENHYDRAMINE HCL 50 MG/ML IJ SOLN: 12.5000 mg | Freq: Four times a day (QID) | INTRAMUSCULAR | Status: DC | PRN

## 2021-02-12 MED ORDER — MIDAZOLAM HCL 2 MG/2ML IJ SOLN: 0.5000 mg | Freq: Once | INTRAMUSCULAR | Status: DC | PRN

## 2021-02-12 MED ORDER — POLYETHYLENE GLYCOL 3350 17 GM/SCOOP PO POWD
1.0000 | Freq: Once | ORAL | Status: DC
  Filled 2021-02-12: qty 255

## 2021-02-12 MED ORDER — ONDANSETRON HCL 4 MG/2ML IJ SOLN
INTRAMUSCULAR | Status: DC | PRN
  Administered 2021-02-12: 4 mg via INTRAVENOUS

## 2021-02-12 MED ORDER — INDOCYANINE GREEN 25 MG IV SOLR
INTRAVENOUS | Status: DC | PRN
  Administered 2021-02-12: 20 mg

## 2021-02-12 MED ORDER — HYDROMORPHONE HCL 1 MG/ML IJ SOLN: 0.2500 mg | INTRAMUSCULAR | Status: DC | PRN

## 2021-02-12 MED ORDER — PHENYLEPHRINE HCL-NACL 10-0.9 MG/250ML-% IV SOLN
INTRAVENOUS | Status: AC
  Filled 2021-02-12: qty 250

## 2021-02-12 MED ORDER — SODIUM CHLORIDE 0.9 % IR SOLN
Status: DC | PRN
  Administered 2021-02-12: 1000 mL

## 2021-02-12 MED ORDER — SIMETHICONE 80 MG PO CHEW: 40.0000 mg | CHEWABLE_TABLET | Freq: Four times a day (QID) | ORAL | Status: DC | PRN

## 2021-02-12 MED ORDER — MEPERIDINE HCL 50 MG/ML IJ SOLN: 6.2500 mg | INTRAMUSCULAR | Status: DC | PRN

## 2021-02-12 MED ORDER — IBUPROFEN 400 MG PO TABS: 600.0000 mg | ORAL_TABLET | Freq: Four times a day (QID) | ORAL | Status: DC | PRN

## 2021-02-12 MED ORDER — CHLORHEXIDINE GLUCONATE CLOTH 2 % EX PADS
6.0000 | MEDICATED_PAD | Freq: Every day | CUTANEOUS | Status: DC
  Administered 2021-02-13: 6 via TOPICAL

## 2021-02-12 MED ORDER — LACTATED RINGERS IV SOLN: INTRAVENOUS | Status: DC | PRN

## 2021-02-12 MED ORDER — LACTATED RINGERS IR SOLN
Status: DC | PRN
  Administered 2021-02-12: 1000 mL

## 2021-02-12 MED ORDER — BUPIVACAINE-EPINEPHRINE (PF) 0.25% -1:200000 IJ SOLN
INTRAMUSCULAR | Status: DC | PRN
  Administered 2021-02-12: 30 mL

## 2021-02-12 MED ORDER — GLYCOPYRROLATE PF 0.2 MG/ML IJ SOSY
PREFILLED_SYRINGE | INTRAMUSCULAR | Status: DC | PRN
  Administered 2021-02-12: .2 mg via INTRAVENOUS

## 2021-02-12 MED ORDER — FENTANYL CITRATE (PF) 250 MCG/5ML IJ SOLN
INTRAMUSCULAR | Status: AC
  Filled 2021-02-12: qty 5

## 2021-02-12 MED ORDER — ACETAMINOPHEN 500 MG PO TABS: 1000.0000 mg | ORAL_TABLET | Freq: Once | ORAL | Status: DC

## 2021-02-12 MED ORDER — ACETAMINOPHEN 500 MG PO TABS
1000.0000 mg | ORAL_TABLET | ORAL | Status: DC
  Filled 2021-02-12: qty 2

## 2021-02-12 MED ORDER — HYDROMORPHONE HCL 1 MG/ML IJ SOLN
INTRAMUSCULAR | Status: DC | PRN
  Administered 2021-02-12 (×4): .5 mg via INTRAVENOUS

## 2021-02-12 MED ORDER — ONDANSETRON HCL 4 MG/2ML IJ SOLN
INTRAMUSCULAR | Status: AC
  Filled 2021-02-12: qty 2

## 2021-02-12 MED ORDER — DIPHENHYDRAMINE HCL 12.5 MG/5ML PO ELIX: 12.5000 mg | ORAL_SOLUTION | Freq: Four times a day (QID) | ORAL | Status: DC | PRN

## 2021-02-12 MED ORDER — BUPIVACAINE LIPOSOME 1.3 % IJ SUSP
INTRAMUSCULAR | Status: DC | PRN
  Administered 2021-02-12: 20 mL

## 2021-02-12 MED ORDER — CHLORHEXIDINE GLUCONATE 0.12 % MT SOLN
15.0000 mL | Freq: Once | OROMUCOSAL | Status: AC
  Administered 2021-02-12: 15 mL via OROMUCOSAL

## 2021-02-12 MED ORDER — CLINDAMYCIN PHOSPHATE 900 MG/50ML IV SOLN
900.0000 mg | INTRAVENOUS | Status: AC
  Administered 2021-02-12: 900 mg via INTRAVENOUS
  Filled 2021-02-12: qty 50

## 2021-02-12 MED ORDER — SIMVASTATIN 20 MG PO TABS
40.0000 mg | ORAL_TABLET | Freq: Every day | ORAL | Status: DC
  Administered 2021-02-12 – 2021-02-14 (×3): 40 mg via ORAL
  Filled 2021-02-12 (×2): qty 1
  Filled 2021-02-12: qty 2

## 2021-02-12 MED ORDER — ORAL CARE MOUTH RINSE: 15.0000 mL | Freq: Once | OROMUCOSAL | Status: AC

## 2021-02-12 SURGICAL SUPPLY — 117 items
ADAPTER GOLDBERG URETERAL (ADAPTER) ×4 IMPLANT
APPLIER CLIP 5 13 M/L LIGAMAX5 (MISCELLANEOUS)
APPLIER CLIP ROT 10 11.4 M/L (STAPLE)
BAG URO CATCHER STRL LF (MISCELLANEOUS) ×4 IMPLANT
BLADE EXTENDED COATED 6.5IN (ELECTRODE) ×4 IMPLANT
CANNULA REDUC XI 12-8 STAPL (CANNULA) ×4
CANNULA REDUCER 12-8 DVNC XI (CANNULA) ×3 IMPLANT
CATH INTERMIT  6FR 70CM (CATHETERS) ×8 IMPLANT
CELLS DAT CNTRL 66122 CELL SVR (MISCELLANEOUS) IMPLANT
CHLORAPREP W/TINT 26 (MISCELLANEOUS) ×4 IMPLANT
CLIP APPLIE 5 13 M/L LIGAMAX5 (MISCELLANEOUS) IMPLANT
CLIP APPLIE ROT 10 11.4 M/L (STAPLE) IMPLANT
CLIP VESOLOCK LG 6/CT PURPLE (CLIP) IMPLANT
CLIP VESOLOCK MED LG 6/CT (CLIP) IMPLANT
CLOTH BEACON ORANGE TIMEOUT ST (SAFETY) ×4 IMPLANT
COVER SURGICAL LIGHT HANDLE (MISCELLANEOUS) ×8 IMPLANT
COVER TIP SHEARS 8 DVNC (MISCELLANEOUS) ×3 IMPLANT
COVER TIP SHEARS 8MM DA VINCI (MISCELLANEOUS) ×4
COVER WAND RF STERILE (DRAPES) IMPLANT
DECANTER SPIKE VIAL GLASS SM (MISCELLANEOUS) ×4 IMPLANT
DEVICE TROCAR PUNCTURE CLOSURE (ENDOMECHANICALS) IMPLANT
DRAIN CHANNEL 19F RND (DRAIN) ×4 IMPLANT
DRAPE ARM DVNC X/XI (DISPOSABLE) ×12 IMPLANT
DRAPE COLUMN DVNC XI (DISPOSABLE) ×3 IMPLANT
DRAPE DA VINCI XI ARM (DISPOSABLE) ×16
DRAPE DA VINCI XI COLUMN (DISPOSABLE) ×4
DRAPE SURG IRRIG POUCH 19X23 (DRAPES) ×4 IMPLANT
DRSG OPSITE POSTOP 3X4 (GAUZE/BANDAGES/DRESSINGS) ×4 IMPLANT
DRSG OPSITE POSTOP 4X10 (GAUZE/BANDAGES/DRESSINGS) IMPLANT
DRSG OPSITE POSTOP 4X6 (GAUZE/BANDAGES/DRESSINGS) IMPLANT
DRSG OPSITE POSTOP 4X8 (GAUZE/BANDAGES/DRESSINGS) IMPLANT
DRSG TEGADERM 2-3/8X2-3/4 SM (GAUZE/BANDAGES/DRESSINGS) ×20 IMPLANT
DRSG TEGADERM 4X4.75 (GAUZE/BANDAGES/DRESSINGS) ×4 IMPLANT
ELECT REM PT RETURN 15FT ADLT (MISCELLANEOUS) ×4 IMPLANT
ENDOLOOP SUT PDS II  0 18 (SUTURE)
ENDOLOOP SUT PDS II 0 18 (SUTURE) IMPLANT
EVACUATOR SILICONE 100CC (DRAIN) ×4 IMPLANT
GAUZE SPONGE 2X2 8PLY STRL LF (GAUZE/BANDAGES/DRESSINGS) ×3 IMPLANT
GAUZE SPONGE 4X4 12PLY STRL (GAUZE/BANDAGES/DRESSINGS) IMPLANT
GLOVE SURG ENC MOIS LTX SZ7.5 (GLOVE) ×16 IMPLANT
GLOVE SURG UNDER LTX SZ8 (GLOVE) ×12 IMPLANT
GOWN STRL REUS W/TWL LRG LVL3 (GOWN DISPOSABLE) ×8 IMPLANT
GOWN STRL REUS W/TWL XL LVL3 (GOWN DISPOSABLE) ×24 IMPLANT
GRASPER SUT TROCAR 14GX15 (MISCELLANEOUS) IMPLANT
GUIDEWIRE STR DUAL SENSOR (WIRE) ×4 IMPLANT
HOLDER FOLEY CATH W/STRAP (MISCELLANEOUS) ×4 IMPLANT
KIT PROCEDURE DA VINCI SI (MISCELLANEOUS) ×4
KIT PROCEDURE DVNC SI (MISCELLANEOUS) ×3 IMPLANT
KIT TURNOVER KIT A (KITS) ×8 IMPLANT
MANIFOLD NEPTUNE II (INSTRUMENTS) ×4 IMPLANT
NEEDLE INSUFFLATION 14GA 120MM (NEEDLE) ×4 IMPLANT
PACK CARDIOVASCULAR III (CUSTOM PROCEDURE TRAY) ×4 IMPLANT
PACK COLON (CUSTOM PROCEDURE TRAY) ×4 IMPLANT
PACK CYSTO (CUSTOM PROCEDURE TRAY) ×4 IMPLANT
PAD POSITIONING PINK XL (MISCELLANEOUS) ×4 IMPLANT
PENCIL SMOKE EVACUATOR (MISCELLANEOUS) IMPLANT
PORT LAP GEL ALEXIS MED 5-9CM (MISCELLANEOUS) ×4 IMPLANT
PROTECTOR NERVE ULNAR (MISCELLANEOUS) ×8 IMPLANT
RELOAD STAPLER 3.5X45 BLU DVNC (STAPLE) IMPLANT
RELOAD STAPLER 3.5X60 BLU DVNC (STAPLE) IMPLANT
RELOAD STAPLER 4.3X45 GRN DVNC (STAPLE) IMPLANT
RELOAD STAPLER 4.3X60 GRN DVNC (STAPLE) ×3 IMPLANT
RTRCTR WOUND ALEXIS 18CM MED (MISCELLANEOUS)
SCISSORS LAP 5X35 DISP (ENDOMECHANICALS) IMPLANT
SEAL CANN UNIV 5-8 DVNC XI (MISCELLANEOUS) ×12 IMPLANT
SEAL XI 5MM-8MM UNIVERSAL (MISCELLANEOUS) ×16
SEALER VESSEL DA VINCI XI (MISCELLANEOUS) ×4
SEALER VESSEL EXT DVNC XI (MISCELLANEOUS) ×3 IMPLANT
SET IRRIG TUBING LAPAROSCOPIC (IRRIGATION / IRRIGATOR) ×4 IMPLANT
SLEEVE ADV FIXATION 5X100MM (TROCAR) IMPLANT
SOLUTION ELECTROLUBE (MISCELLANEOUS) ×4 IMPLANT
SPONGE GAUZE 2X2 STER 10/PKG (GAUZE/BANDAGES/DRESSINGS) ×1
STAPLER 60 DA VINCI SURE FORM (STAPLE) ×4
STAPLER 60 SUREFORM DVNC (STAPLE) ×3 IMPLANT
STAPLER CANNULA SEAL DVNC XI (STAPLE) ×3 IMPLANT
STAPLER CANNULA SEAL XI (STAPLE) ×4
STAPLER ECHELON POWER CIR 29 (STAPLE) ×4 IMPLANT
STAPLER ECHELON POWER CIR 31 (STAPLE) IMPLANT
STAPLER RELOAD 3.5X45 BLU DVNC (STAPLE)
STAPLER RELOAD 3.5X45 BLUE (STAPLE)
STAPLER RELOAD 3.5X60 BLU DVNC (STAPLE)
STAPLER RELOAD 3.5X60 BLUE (STAPLE)
STAPLER RELOAD 4.3X45 GREEN (STAPLE)
STAPLER RELOAD 4.3X45 GRN DVNC (STAPLE)
STAPLER RELOAD 4.3X60 GREEN (STAPLE) ×4
STAPLER RELOAD 4.3X60 GRN DVNC (STAPLE) ×3
STAPLER SHEATH (SHEATH) ×4
STAPLER SHEATH ENDOWRIST DVNC (SHEATH) ×3 IMPLANT
STOPCOCK 4 WAY LG BORE MALE ST (IV SETS) ×8 IMPLANT
SURGILUBE 2OZ TUBE FLIPTOP (MISCELLANEOUS) ×4 IMPLANT
SUT MNCRL AB 4-0 PS2 18 (SUTURE) ×4 IMPLANT
SUT PDS AB 1 CT1 27 (SUTURE) IMPLANT
SUT PDS AB 1 TP1 96 (SUTURE) IMPLANT
SUT PROLENE 0 CT 2 (SUTURE) IMPLANT
SUT PROLENE 2 0 KS (SUTURE) ×4 IMPLANT
SUT PROLENE 2 0 SH DA (SUTURE) IMPLANT
SUT SILK 2 0 (SUTURE)
SUT SILK 2 0 SH CR/8 (SUTURE) IMPLANT
SUT SILK 2-0 18XBRD TIE 12 (SUTURE) IMPLANT
SUT SILK 3 0 (SUTURE) ×4
SUT SILK 3 0 SH CR/8 (SUTURE) ×4 IMPLANT
SUT SILK 3-0 18XBRD TIE 12 (SUTURE) ×3 IMPLANT
SUT V-LOC BARB 180 2/0GR6 GS22 (SUTURE)
SUT VIC AB 3-0 SH 18 (SUTURE) IMPLANT
SUT VIC AB 3-0 SH 27 (SUTURE)
SUT VIC AB 3-0 SH 27XBRD (SUTURE) IMPLANT
SUT VICRYL 0 UR6 27IN ABS (SUTURE) ×4 IMPLANT
SUTURE V-LC BRB 180 2/0GR6GS22 (SUTURE) IMPLANT
SYR 10ML LL (SYRINGE) ×4 IMPLANT
SYS LAPSCP GELPORT 120MM (MISCELLANEOUS)
SYSTEM LAPSCP GELPORT 120MM (MISCELLANEOUS) IMPLANT
TOWEL OR NON WOVEN STRL DISP B (DISPOSABLE) ×4 IMPLANT
TRAY FOLEY MTR SLVR 14FR STAT (SET/KITS/TRAYS/PACK) ×4 IMPLANT
TROCAR ADV FIXATION 5X100MM (TROCAR) ×4 IMPLANT
TUBING CONNECTING 10 (TUBING) ×12 IMPLANT
TUBING INSUFFLATION 10FT LAP (TUBING) ×4 IMPLANT
TUBING UROLOGY SET (TUBING) IMPLANT

## 2021-02-12 NOTE — Anesthesia Postprocedure Evaluation (Signed)
Anesthesia Post Note  Patient: [place]  Procedure(s) Performed: ROBOTIC ASSISTED SIGMOIDECTOMY WITH TAP BLOCK (N/A Abdomen) FLEXIBLE SIGMOIDOSCOPY (N/A ) CYSTOSCOPY WITH FIREFLY INJECTION AND URETERAL CATHETERS (Bilateral Ureter)     Patient location during evaluation: PACU Anesthesia Type: General Level of consciousness: awake and alert, patient cooperative and oriented Pain management: pain level controlled Vital Signs Assessment: post-procedure vital signs reviewed and stable Respiratory status: spontaneous breathing, nonlabored ventilation, respiratory function stable and patient connected to nasal cannula oxygen Cardiovascular status: blood pressure returned to baseline and stable Postop Assessment: no apparent nausea or vomiting Anesthetic complications: no Comments: CRNA discussed lip lac with patient   No complications documented.  Last Vitals:  Vitals:   02/12/21 1715 02/12/21 1730  BP: (!) 141/74 (!) 150/66  Pulse: 75 71  Resp: 16 18  Temp:    SpO2: 100% 100%    Last Pain:  Vitals:   02/12/21 1730  TempSrc:   PainSc: Asleep                 Denisha Hoel,E. Safire Gordin

## 2021-02-12 NOTE — Op Note (Signed)
PATIENT: Gloria Bell  70 y.o. female  Patient Care Team: Philmore Pali, NP as PCP - General (Nurse Practitioner)  PREOP DIAGNOSIS: DIVERTICULITIS  POSTOP DIAGNOSIS: DIVERTICULITIS  PROCEDURE: Robotic low anterior resection with double stapled colorectal anastomosis 2. Intraoperative assessment of perfusion (ICG) 3. Flexible sigmoidoscopy 4. Bilateral transversus abdominus plane blocks  SURGEON: Sharon Mt. Joseline Mccampbell, MD  ASSISTANT: Leighton Ruff, Md  ANESTHESIA: General endotracheal  EBL: 50 mL Total I/O In: 2458.3 [I.V.:2300; IV Piggyback:158.3] Out: 200 [Urine:150; Blood:50]  DRAINS: None  SPECIMEN: Rectosigmoid colon - open end proximal  COUNTS: Sponge, needle and instrument counts were reported correct x2  FINDINGS:  Large loop of sigmoid in the pelvis with adhesive disease related to prior diverticulitis. Thickening extended to secondairly involved the proximal most portion of rectum, therefore a low anterior carried out to achieve healthy supple rectum. A 29 mm EEA colorectal anastomosis fashioned 12 cm from the anal verge by flexible sigmoidoscopy.  NARRATIVE: Informed consent was verified. She was taken to the operating room, placed supine on the operating table and SCD's were applied. General endotracheal anesthesia was induced without difficulty. Hair on the abdomen was then clipped. Arms were tucked. The patient was then positioned in the lithotomy position with Allen stirrups. Pressure points were then padded.  Dr. Gloriann Loan then scrubbed for his portion of the procedure. Please refer to his note for details.  The abdomen was then prepped and draped in the standard sterile fashion. Surgical timeout was called indicating the correct patient, procedure, positioning and need for preoperative antibiotics.   An OG tube was placed by anesthesia and confirmed to be to suction.  At Palmer's point, a stab incision was created and the Veress needle was introduced into the  peritoneal cavity on the first attempt.  Intraperitoneal location was confirmed by the aspiration and saline drop test.  Pneumoperitoneum was established to a maximum pressure of 15 mmHg using CO2.  Following this, the abdomen was marked for planned trocar sites.  Just to the right and cephalad to the umbilicus, an 8 mm incision was created and an 8 mm blunt tipped robotic trocar was cautiously placed into the peritoneal cavity.  The laparoscope was inserted and demonstrated no evidence of trocar site nor Veress needle site complications.  The Veress needle was removed.  Bilateral transversus abdominis plane blocks were then created using a dilute mixture of Exparel with Marcaine.  3 additional 8 mm robotic trochars were placed under direct visualization roughly in a line extending from the right ASIS towards the left upper quadrant. The bladder was inspected and noted to be at/below the pubic symphysis.  Staying 3 fingerbreadths above the pubic symphysis, an incision was created and the 12 mm robotic trocar inserted directed cephalad into the peritoneal cavity under direct visualization.  An additional 5 mm assist port was placed in the right lateral abdomen under direct visualization.  The abdomen was surveyed. She was positioned in Trendelenburg with some left side up.  Small bowel was carefully retracted out of the pelvis.  The robot was then docked and I went to the console.   The sigmoid colon was readily identified.  There is a large loop entering the pelvis. This has adhesions to itself and to the rectum related to her prior diverticulitis and pelvic procedures. These are carefully lysed sharply. The rectum is inspected and without serosal injury.  Attachments of the sigmoid colon were taken down from the intersigmoid fossa.  The rectosigmoid colon was grasped and  elevated anteriorly. The proximalmost rectum is somewhat thickened presumably related to the neighboring prior diverticulitis.  Beginning with  a medial to lateral approach, the peritoneum overlying the presacral space was carefully incised.  The TME plane was readily gained working in a plane between the fascia propria of the rectum and the presacral fascia.  Hypogastric nerves were seen going along the the presacral fascia and were protected free of injury.  Working more proximally, the mesorectum and sigmoid mesentery were carefully mobilized off of the peritoneum.  The left ureter was identified and protected free of injury.  The left gonadal vessels were identified and protected.  These were both swept "down."  The superior hemorrhoidal and IMA pedicles were identified. Further mesocolon was mobilized proximally staying in this plane between the retroperitoneum proper and the mesocolon. Attention was then turned to the lateral portion of dissection.  The sigmoid colon was retracted to the right.  The sigmoid colon was fully mobilized and working proximal to the diseased segment of colon, the descending colon was mobilized by incising the Destynie Toomey line of [place].  This was done all the way up to the level of the splenic flexure.  The associated mesocolon was also mobilized medially.  The left ureter again was confirmed to be well away for plan dissection and well away from the vasculature which had been dissected medially.  The rectosigmoid colon was elevated anteriorly. The left ureter was re-identified. The IMA was clear of this and circumferentially dissected. The IMA was then sealed and divided with the vessel sealer. The stump was inspected and noted to be completely hemostatic with a good seal.  The mesentery was divided out to the point of planned proximal division.  Working more distally, the rectum was identified where the tinea had splayed and there were loss of appendices epiploica and this is just below the somewhat thickened proximalmost rectum.  This corresponds to a location just distal to the sacral promontory.  Anatomically, this clearly  represents the proximal rectum.  The mesentery out to this level was then cleared using the vessel sealer. The distal point of transection on the proximal rectum was identified.   A 60 mm green load robotic stapler was then placed through the 12 mm port and introduced into the peritoneal cavity.  The rectum was divided with a single firing of the stapler.  The stump was intact and healthy in appearance.   Attention was turned to performing a perfusion test. ICG was administered by anesthesia and at the level of the cleared mesentery proximally, there was excellent uptake of the tracer.  The rectum was also well perfused in appearance.  There was a visible pulse in the mesentery out to the level of the cleared colon at the level of the proximal sigmoid/descending colon junction.  This colon is also supple and healthy in appearance without any thickening.  This reached into the pelvis without any difficulty and remained in that location without any tension. A locking grasper was then placed on the sigmoid staple line.   Attention was turned to the extracorporeal portion of the procedure.  The robot was undocked.  I scrubbed back in.  Using the 12 mm trocar site, a Pfannenstiel incision was created and incorporated the fascial opening through the 12 mm port site.  The rectus fascia was incised and then elevated.  The rectus muscle was mobilized free of the overlying fascia.  The peritoneum was incised in the midline well above the location of the bladder.  An [place] wound protector was placed.  Towels were placed around the field.  The divided colon was passed through the wound protector.  The point of proximal division was identified and was again on a healthy segment of supple colon with a palpable pulse in the mesentery. This was pink in color.  A pursestring device was applied.  A 2-0 Prolene on a Keith needle was passed.  The colon was divided and passed off with the open end being proximal.  EEA sizers were  then introduced and a 29 mm EEA selected.  "Belt loops" consisting of 3-0 silk were placed around the pursestring suture line.  The anvil was placed and the pursestring tied.  A small amount of fat was cleared from the planned anastomosis and no diverticula were apparent within this.  This was placed back into the abdomen and a cap placed over the wound protector port site.  Pneumoperitoneum was reestablished.  I then went below to pass the stapler.  My partner remained above.  EEA sizers were cautiously passed trans-anally under direct visualization.  The stapler was passed and the spike deployed just anterior to the staple line.  The components were then mated.  Orientation was confirmed such that there is no twisting of the colon nor small bowel underneath the mesenteric defect. Care was taken to ensure no other structures were incorporated within this either.  The stapler was then closed, held, and fired. This was then removed. The donuts were inspected and noted to be complete.  The colon proximal to the anastomosis was then gently occluded. The pelvis was filled with sterile irrigation. Under direct visualization, I passed a flexible sigmoidoscope.  The anastomosis was under water.  With good distention of the anastomosis there was no air leak. The anastomosis pink in appearance.  This is located at 12 cm from the anal verge by flexible sigmoidoscopy.  It is hemostatic.  Additionally, looking from above, there is no tension on the colon or mesentery.  Sigmoidoscope was withdrawn.  Irrigation was evacuated from the pelvis.  The abdomen and pelvis are surveyed and noted to be completely hemostatic without any apparent injury.  Under direct visualization, all trochars are removed.  The [place] wound protector was removed.  Gowns/gloves are changed and a fresh set of clean instruments utilized. Additional sterile drapes were placed around the field.   Sponge, needle, and instrument counts were reported correct.   The Pfannenstiel peritoneum was closed with a running 2-0 Vicryl suture.  The rectus fascia was then closed using 2 running #1 PDS sutures.  The fascia was then palpated and noted to be completely closed.  Additional anesthetic was infiltrated at the Pfannenstiel site.  Sponge, needle, and instrument counts were then again reported correct. 4-0 Monocryl subcuticular suture was used to close the skin of all incision sites.  Dermabond was placed over all incisions.  A honeycomb dressing placed over the Pfannenstiel as well.   She is taken out of lithotomy, awakened from anesthesia, extubated, and transferred to a stretcher for transport to PACU in satisfactory condition having tolerated the procedure well.

## 2021-02-12 NOTE — Transfer of Care (Signed)
Immediate Anesthesia Transfer of Care Note  Patient: Gloria Bell  Procedure(s) Performed: ROBOTIC ASSISTED SIGMOIDECTOMY WITH TAP BLOCK (N/A Abdomen) FLEXIBLE SIGMOIDOSCOPY (N/A ) CYSTOSCOPY WITH FIREFLY INJECTION AND URETERAL CATHETERS (Bilateral Ureter)  Patient Location: PACU  Anesthesia Type:General  Level of Consciousness: awake, alert  and oriented  Airway & Oxygen Therapy: Patient Spontanous Breathing and Patient connected to face mask  Post-op Assessment: Report given to RN and Post -op Vital signs reviewed and stable  Post vital signs: Reviewed and stable  Last Vitals:  Vitals Value Taken Time  BP 155/75 02/12/21 1630  Temp    Pulse 66 02/12/21 1638  Resp 12 02/12/21 1638  SpO2 100 % 02/12/21 1638  Vitals shown include unvalidated device data.  Last Pain:  Vitals:   02/12/21 1126  TempSrc: Oral  PainSc: 0-No pain      Patients Stated Pain Goal: 4 (01/75/10 2585)  Complications: No complications documented.

## 2021-02-12 NOTE — Op Note (Signed)
Operative Note  Preoperative diagnosis:  1.  Recurrent diverticulitis  Postoperative diagnosis: 1.  Recurrent diverticulitis  Procedure(s): 1.  Cystoscopy with bilateral instillation of firefly into the ureters, bilateral temporary ureteral catheterization  Surgeon: Link Snuffer, MD  Assistants: None  Anesthesia: General  Complications: None immediate  EBL: Minimal  Specimens: 1.  None  Drains/Catheters: 1.  Bilateral open-ended ureteral catheters and Foley catheter  Intraoperative findings: Normal urethra and bladder  Indication: 70 year old female with recurrent diverticulitis presents for sigmoid colectomy.  Intraoperative firefly injection and ureteral catheterization requested.  Description of procedure:  The patient was identified and consent was obtained.  The patient was taken to the operating room and placed in the supine position.  The patient was placed under general anesthesia.  Perioperative antibiotics were administered.  The patient was placed in dorsal lithotomy.  Patient was prepped and draped in a standard sterile fashion and a timeout was performed.  A 21 French rigid cystoscope was advanced into the urethra and into the bladder.  Complete cystoscopy was performed with no abnormal findings.  The left ureter was cannulated with an open-ended ureteral catheter and 7.5 cc of firefly was instilled.  A wire was passed up the ureter without resistance.  The open-ended ureteral catheter was carefully passed over the wire without resistance.  The wire was withdrawn and the scope was withdrawn keeping the open-ended ureteral catheter in place.  I readvanced the scope into the bladder and cannulated the right ureter with an open-ended ureteral catheter.  7.5 cc of firefly was instilled.  A wire was then passed through the ureteral catheter up the right ureter and into the kidney.  The open-ended ureteral catheter was passed over the wire without resistance.  The wire was  withdrawn.  The scope was then withdrawn keeping the open-ended ureteral catheter in place.  I placed a Foley catheter and secured the open-ended ureteral catheters to the catheter with a silk suture.  I threaded the open-ended ureteral catheters into a drainage bag.  This concluded the portion of my procedure.  The case was turned over to general surgery.  Plan: As per general surgery

## 2021-02-12 NOTE — H&P (Signed)
CC: Here today for surgery  HPI: Ms. Noelene Gang is a very pleasant 70 year old female who presents for follow-up evaluation. She has a history dating back at least 10 years of diverticulitis. She reports that everything became a problem after she was on prolonged narcotics following trauma and pneumothorax orthopedics surgeries in the early 2000. Over the last 10 years she is average between 4 and 5 attacks of diverticulitis. This has significantly impacted her quality of life. With each of these attacks she describes severe left lower quadrant abdominal pain that is like a deep and strong cramp the last 4 days. She reports numerous courses of antibiotics being prescribed for these flares. The frequency of attacks has become increasingly problematic now happening more than there weren't the past.   She had a CT scan that per report showed acute diverticulitis of the descending colon with a small intramural abscess, but we do not have the actual images available to [place]. She was stable and afebrile and was discharged home with a course of oral antibiotics. The patient reports she has had 4-5 episodes of diverticulitis over the last 15 years. She has had known extensive sigmoid diverticulosis for many years and previously discussed colon resection with Dr. Marlou Starks but ultimately decided not to undergo surgery.  She has possible dysmotility and takes Miralax daily in order to have regular bowel movements - this being confounded by significant history of multiply recurrent sigmoid diverticulitis. In the past she has not been able to have a full colonoscopy due to inability to pass the sigmoid colon, thought to be due to ?adhesive disease. She has been getting virtual colonoscopies  Last virtual was 12/16/2020 - this demonstrated a 6 x 13 candidate polyp in the distal ascending colon. No other concerning polyps or masses. She was noted to have extensive wall thickening and diverticulosis in the sigmoid  colon with associated underdistention of just this segment of colon.  CT 07/21/20 - she brought copies of imaging to the office - shows diverticulitis involving the distal descending colon with intramural abscess area and severe sigmoid diverticulosis. We're able to review these images today.  INTERVAL HX She has been doing well. Tolerated her bowel prep with good result. Currently, she denies any abdominal pain, nausea or vomiting, fever or chills. She does have a history of hemorrhoids and has undergone hemorrhoidectomy before. She has struggled with constipation as well as recurring issues with some bright red blood per rectum associated with firm hard stool.   PMH: recurrent diverticulitis, hypothyroid, hypertension, HLD, kidney stones, transitional cell carcinoma of bladder  PSH: TURBT, ovarian cystectomy with pelvic adhesion removal? She denies any history of radiation treatment  FHx: Denies FHx of colorectal, breast, endometrial, ovarian or cervical cancer  Social: Denies use of tobacco/EtOH/drugs. She reports she is happily retired  ROS: A comprehensive 10 system review of systems was completed with the patient and pertinent findings as noted above.  Past Medical History:  Diagnosis Date  . Ankle mass, right 02/07/2020   lateral to achilles tendon  . Anxiety   . Arthritis   . Cancer ([place])    bladder  . Chronic kidney disease    Bladder Tumor  . Constipation   . Diverticulitis    history of a bleed  . Diverticulosis    was on flagyl for 10 years. nerves to bowels were injured during an old accident. frequent flareups.  . Fall from ladder 09/2003  . History of kidney stones 1987  .  Hypercholesteremia   . Hyperlipidemia   . Hypertension   . Hypothyroidism   . Insomnia   . Osteoarthritis   . Osteopenia    of hip and spine  . Osteoporosis   . PONV (postoperative nausea and vomiting)   . Scoliosis of thoracic region due to degenerative disease of spine in  adult     Past Surgical History:  Procedure Laterality Date  . ABDOMINAL HYSTERECTOMY    . APPENDECTOMY    . BILATERAL SALPINGECTOMY  1997  . CHOLECYSTECTOMY  1992  . COLONOSCOPY  2015  . CYSTOSCOPY W/ RETROGRADES Bilateral 03/01/2017   Procedure: CYSTOSCOPY WITH RETROGRADE PYELOGRAM;  Surgeon: Hollice Espy, MD;  Location: [facility];  Service: Urology;  Laterality: Bilateral;  . ELBOW FRACTURE SURGERY Right 2005   no metal  . FRACTURE SURGERY    . [place] BANDING  2014  . KNEE SURGERY Bilateral    arthroscopies. bone on bone  . MASS EXCISION Right 02/27/2020   Procedure: EXCISION MASS RIGHT HEEL;  Surgeon: Caroline More, DPM;  Location: [facility];  Service: Podiatry;  Laterality: Right;  . OTHER SURGICAL HISTORY     had to be on percocet for a year and it slowed down her "gut"   . right ankle surgery     . TONSILLECTOMY    . TRANSURETHRAL RESECTION OF BLADDER TUMOR WITH GYRUS (TURBT-GYRUS)  2018  . TRANSURETHRAL RESECTION OF BLADDER TUMOR WITH MITOMYCIN-C N/A 03/01/2017   Procedure: TRANSURETHRAL RESECTION OF BLADDER TUMOR WITH MITOMYCIN-C;  Surgeon: Hollice Espy, MD;  Location: [facility];  Service: Urology;  Laterality: N/A;  . TUBAL LIGATION      Family History  Problem Relation Age of Onset  . Heart disease Father        heart attack  . Diabetes Father   . Cancer Paternal Grandfather        prostate    Social:  reports that she has never smoked. She has never used smokeless tobacco. She reports that she does not drink alcohol and does not use drugs.  Allergies:  Allergies  Allergen Reactions  . Metronidazole Nausea And Vomiting and Dermatitis    States previously had n/v to this med then had a rxt with "burning skin all over". Was on this for 10 years d/t diverticulosis    . Oxycodone-Acetaminophen Nausea And Vomiting  . Sulfa Antibiotics Nausea And Vomiting  . Amitriptyline Other (See Comments)    Dizziness. Felt drunk Was tried for  pain.  . Amlodipine Swelling    Ankle swelling  . Ampicillin Nausea And Vomiting    Has patient had a PCN reaction causing immediate rash, facial/tongue/throat swelling, SOB or lightheadedness with hypotension: No Has patient had a PCN reaction causing severe rash involving mucus membranes or skin necrosis: No Has patient had a PCN reaction that required hospitalization: No Has patient had a PCN reaction occurring within the last 10 years: No If all of the above answers are "NO", then may proceed with Cephalosporin use..   . Cephalexin Nausea And Vomiting  . Ciprofloxacin Dermatitis    Pt states felt like "burning under her skin all over"   . Citalopram Other (See Comments)    Difficulty sleeping, shakes, headaches, sick to her stomach  . Clarithromycin Nausea And Vomiting  . Codeine Nausea And Vomiting and Other (See Comments)  . Crestor [Rosuvastatin] Other (See Comments)    Hair loss  . Hydrochlorothiazide Nausea And Vomiting  . Hydrocodone-Acetaminophen Nausea And Vomiting  When taking with zofran,she has no problems with this  . Lisinopril Other (See Comments) and Cough    headaches  . Macrobid [Nitrofurantoin] Other (See Comments)    Burning sensation in legs  . Simvastatin Other (See Comments)    Causes "stomach burning" Currently on this medication and is doing okay if she eats food first    Medications: I have reviewed the patient's current medications.  Results for orders placed or performed during the hospital encounter of 02/10/21 (from the past 48 hour(s))  SARS CORONAVIRUS 2 (TAT 6-24 HRS) Nasopharyngeal Nasopharyngeal Swab     Status: None   Collection Time: 02/10/21  2:42 PM   Specimen: Nasopharyngeal Swab  Result Value Ref Range   SARS Coronavirus 2 NEGATIVE NEGATIVE    Comment: (NOTE) SARS-CoV-2 target nucleic acids are NOT DETECTED.  The SARS-CoV-2 RNA is generally detectable in upper and lower respiratory specimens during the acute phase of infection.  Negative results do not preclude SARS-CoV-2 infection, do not rule out co-infections with other pathogens, and should not be used as the sole basis for treatment or other patient management decisions. Negative results must be combined with clinical observations, patient history, and epidemiological information. The expected result is Negative.  Fact Sheet for Patients: SugarRoll.be  Fact Sheet for Healthcare Providers: https://www.woods-mathews.com/  This test is not yet approved or cleared by the [place] FDA and  has been authorized for detection and/or diagnosis of SARS-CoV-2 by FDA under an Emergency Use Authorization (EUA). This EUA will remain  in effect (meaning this test can be used) for the duration of the COVID-19 declaration under Se ction 564(b)(1) of the Act, 21 U.S.C. section 360bbb-3(b)(1), unless the authorization is terminated or revoked sooner.  Performed at [facility], [place] [address] [place], [place] [address]     No results found.  ROS - all of the below systems have been reviewed with the patient and positives are indicated with bold text General: chills, fever or night sweats Eyes: blurry vision or double vision ENT: epistaxis or sore throat Allergy/Immunology: itchy/watery eyes or nasal congestion Hematologic/Lymphatic: bleeding problems, blood clots or swollen lymph nodes Endocrine: temperature intolerance or unexpected weight changes Breast: new or changing breast lumps or nipple discharge Resp: cough, shortness of breath, or wheezing CV: chest pain or dyspnea on exertion GI: as per HPI GU: dysuria, trouble voiding, or hematuria MSK: joint pain or joint stiffness Neuro: TIA or stroke symptoms Derm: pruritus and skin lesion changes Psych: anxiety and depression  PE Blood pressure 138/80, pulse 61, temperature 98.5 F (36.9 C), temperature source Oral, resp. rate 16, height 5\' 6"  (1.676 m),  weight 77.6 kg, SpO2 100 %. Constitutional: NAD; conversant; no deformities Eyes: Moist conjunctiva; no lid lag; anicteric; PERRL Neck: Trachea midline; no thyromegaly Lungs: Normal respiratory effort; no tactile fremitus CV: RRR; no palpable thrills; no pitting edema GI: Abd soft, NT/ND; no palpable hepatosplenomegaly MSK: Normal range of motion of extremities Psychiatric: Appropriate affect; alert and oriented x3  Results for orders placed or performed during the hospital encounter of 02/10/21 (from the past 48 hour(s))  SARS CORONAVIRUS 2 (TAT 6-24 HRS) Nasopharyngeal Nasopharyngeal Swab     Status: None   Collection Time: 02/10/21  2:42 PM   Specimen: Nasopharyngeal Swab  Result Value Ref Range   SARS Coronavirus 2 NEGATIVE NEGATIVE    Comment: (NOTE) SARS-CoV-2 target nucleic acids are NOT DETECTED.  The SARS-CoV-2 RNA is generally detectable in upper and lower respiratory specimens during  the acute phase of infection. Negative results do not preclude SARS-CoV-2 infection, do not rule out co-infections with other pathogens, and should not be used as the sole basis for treatment or other patient management decisions. Negative results must be combined with clinical observations, patient history, and epidemiological information. The expected result is Negative.  Fact Sheet for Patients: SugarRoll.be  Fact Sheet for Healthcare Providers: https://www.woods-mathews.com/  This test is not yet approved or cleared by the [place] FDA and  has been authorized for detection and/or diagnosis of SARS-CoV-2 by FDA under an Emergency Use Authorization (EUA). This EUA will remain  in effect (meaning this test can be used) for the duration of the COVID-19 declaration under Se ction 564(b)(1) of the Act, 21 U.S.C. section 360bbb-3(b)(1), unless the authorization is terminated or revoked sooner.  Performed at [facility], [place]  [address], [place], [place] [address]     No results found.   A/P: Ms. Maebell Lyvers is a very pleasant 53yoF with hx of multiply recurrent attacks of diverticulitis including CT confirmed attacks - CT 07/21/20 - intramural abscess in distal descending colon; CT virtual colonoscopy 12/16/20 - significant chronic appearing wall thickening and non-distendable sigmoid; possible polyp in ascending colon  -She has been told she is not a candidate for colonoscopy due to her current anatomy; would plan though for this in the months following surgery to evaluate her ascending colon -She specifically denies any tissue prolapse from her anus; solely occasional BRBPR. Nothing that she reports she has to reduce  -The anatomy and physiology of the GI tract was discussed at length with her today. The pathophysiology of diverticulitis was discussed at length with associated pictures. -We reviewed options going forward. I do think should benefit from sigmoid colectomy given the significant detriments she describes with regards to quality of life due to her frequent recurring attacks that are now occurring more frequently. She is highly interested in pursuing surgery to reduce her risk for recurrent diverticulitis. -We discussed robotic and potential open techniques to sigmoid colectomy. Possible takedown of splenic flexure. Flexible sigmoidoscopy. -[facility] for ureteral ICG/firefly given chronicity of her symptoms as well as her history of possible pelvic "adhesions." -The planned procedures, material risks (including, but not limited to, pain, bleeding, infection, scarring, need for blood transfusion, damage to surrounding structures- blood vessels/nerves/viscus/organs, damage to ureter, urine leak, leak from anastomosis, need for additional procedures, worsening of pre-existing medical conditions, need for stoma which may be permanent, hernia formation, recurrence, DVT/PE, pneumonia, heart attack, stroke, death)  benefits and alternatives to surgery were discussed at length. We noted a good probability that the procedure would help improve her symptoms through reduction of recurrence risks. The patient's questions were answered to her satisfaction, she voiced understanding and elected to proceed with surgery. Additionally, we discussed typical postoperative expectations and the recovery process.  Nadeen Landau, MD [facility] Use [email or web address] to contact on call provider

## 2021-02-12 NOTE — Anesthesia Procedure Notes (Addendum)
Procedure Name: Intubation Performed by: Annye Asa, MD Pre-anesthesia Checklist: Patient identified, Emergency Drugs available, Suction available and Patient being monitored Patient Re-evaluated:Patient Re-evaluated prior to induction Oxygen Delivery Method: Circle system utilized Preoxygenation: Pre-oxygenation with 100% oxygen Induction Type: IV induction Ventilation: Mask ventilation without difficulty and Oral airway inserted - appropriate to patient size Laryngoscope Size: Mac and 3 Grade View: Grade III Tube type: Oral Tube size: 7.0 mm Number of attempts: 2 Airway Equipment and Method: Stylet and Oral airway Placement Confirmation: ETT inserted through vocal cords under direct vision,  positive ETCO2 and breath sounds checked- equal and bilateral Secured at: 21 cm Tube secured with: Tape Dental Injury: Teeth and Oropharynx as per pre-operative assessment  Difficulty Due To: Difficulty was unanticipated and Difficult Airway- due to immobile epiglottis Comments: Present for induction, easy mask, BBS=, +ETCO2 after atraum intubation, Grade 3 view with MAC 3, (lip lac with Sabra Heck 2 CRNA)  Jenita Seashore, MD

## 2021-02-12 NOTE — Anesthesia Preprocedure Evaluation (Addendum)
Anesthesia Evaluation  Patient identified by MRN, date of birth, ID band Patient awake    Reviewed: Allergy & Precautions, NPO status , Patient's Chart, lab work & pertinent test results, reviewed documented beta blocker date and time   History of Anesthesia Complications (+) PONV  Airway Mallampati: I  TM Distance: >3 FB Neck ROM: Full    Dental  (+) Dental Advisory Given, Missing   Pulmonary neg pulmonary ROS,  02/10/2021 SARS coronavirus NEG   breath sounds clear to auscultation       Cardiovascular hypertension, Pt. on medications and Pt. on home beta blockers (-) angina+ CAD (non-obstructive by Cardiac CT)   Rhythm:Regular Rate:Normal     Neuro/Psych negative neurological ROS     GI/Hepatic Neg liver ROS, Diverticular disease   Endo/Other  Hypothyroidism   Renal/GU Renal InsufficiencyRenal disease     Musculoskeletal  (+) Arthritis ,   Abdominal   Peds  Hematology negative hematology ROS (+)   Anesthesia Other Findings H/o bladder cancer  Reproductive/Obstetrics                            Anesthesia Physical Anesthesia Plan  ASA: II  Anesthesia Plan: General   Post-op Pain Management:    Induction: Intravenous  PONV Risk Score and Plan: 4 or greater and Ondansetron, Dexamethasone and Scopolamine patch - Pre-op  Airway Management Planned: Oral ETT  Additional Equipment: None  Intra-op Plan:   Post-operative Plan: Extubation in OR  Informed Consent: I have reviewed the patients History and Physical, chart, labs and discussed the procedure including the risks, benefits and alternatives for the proposed anesthesia with the patient or authorized representative who has indicated his/her understanding and acceptance.     Dental advisory given  Plan Discussed with: CRNA and Surgeon  Anesthesia Plan Comments:        Anesthesia Quick Evaluation

## 2021-02-12 NOTE — Consult Note (Signed)
H&P Physician requesting consult: Nadeen Landau  Chief Complaint: Diverticulitis  History of Present Illness: 70 year old female with a history of recurrent diverticulitis presents for sigmoid colectomy.  Intraoperative firefly up with temporary ureteral stent placement requested.  She also has a history of low-grade urothelial cell carcinoma of the bladder in 2018.  Last cystoscopy in October was negative.  She is a patient of Dr. Erlene Quan in [place].  Past Medical History:  Diagnosis Date  . Ankle mass, right 02/07/2020   lateral to achilles tendon  . Anxiety   . Arthritis   . Cancer ([place])    bladder  . Chronic kidney disease    Bladder Tumor  . Constipation   . Diverticulitis    history of a bleed  . Diverticulosis    was on flagyl for 10 years. nerves to bowels were injured during an old accident. frequent flareups.  . Fall from ladder 09/2003  . History of kidney stones 1987  . Hypercholesteremia   . Hyperlipidemia   . Hypertension   . Hypothyroidism   . Insomnia   . Osteoarthritis   . Osteopenia    of hip and spine  . Osteoporosis   . PONV (postoperative nausea and vomiting)   . Scoliosis of thoracic region due to degenerative disease of spine in adult    Past Surgical History:  Procedure Laterality Date  . ABDOMINAL HYSTERECTOMY    . APPENDECTOMY    . BILATERAL SALPINGECTOMY  1997  . CHOLECYSTECTOMY  1992  . COLONOSCOPY  2015  . CYSTOSCOPY W/ RETROGRADES Bilateral 03/01/2017   Procedure: CYSTOSCOPY WITH RETROGRADE PYELOGRAM;  Surgeon: Hollice Espy, MD;  Location: [facility] ORS;  Service: Urology;  Laterality: Bilateral;  . ELBOW FRACTURE SURGERY Right 2005   no metal  . FRACTURE SURGERY    . [place] BANDING  2014  . KNEE SURGERY Bilateral    arthroscopies. bone on bone  . MASS EXCISION Right 02/27/2020   Procedure: EXCISION MASS RIGHT HEEL;  Surgeon: Caroline More, DPM;  Location: [facility];  Service: Podiatry;  Laterality: Right;   . OTHER SURGICAL HISTORY     had to be on percocet for a year and it slowed down her "gut"   . right ankle surgery     . TONSILLECTOMY    . TRANSURETHRAL RESECTION OF BLADDER TUMOR WITH GYRUS (TURBT-GYRUS)  2018  . TRANSURETHRAL RESECTION OF BLADDER TUMOR WITH MITOMYCIN-C N/A 03/01/2017   Procedure: TRANSURETHRAL RESECTION OF BLADDER TUMOR WITH MITOMYCIN-C;  Surgeon: Hollice Espy, MD;  Location: [facility];  Service: Urology;  Laterality: N/A;  . TUBAL LIGATION      Home Medications:  Medications Prior to Admission  Medication Sig Dispense Refill Last Dose  . candesartan (ATACAND) 16 MG tablet Take 16 mg by mouth 2 (two) times daily. (0900 & 1700)   02/11/2021 at Unknown time  . levothyroxine (SYNTHROID) 50 MCG tablet Take 50 mcg by mouth in the morning.   02/12/2021 at Unknown time  . metoprolol succinate (TOPROL-XL) 100 MG 24 hr tablet Take 100 mg by mouth 2 (two) times daily. (0900 & 1700)   02/12/2021 at Unknown time  . Multiple Vitamin (MULTIVITAMIN WITH MINERALS) TABS tablet Take 1 tablet by mouth daily with lunch.   Past Week at Unknown time  . neomycin (MYCIFRADIN) 500 MG tablet Take 1,000 mg by mouth 3 (three) times daily.   02/11/2021 at Unknown time  . ondansetron (ZOFRAN) 4 MG tablet Take 4 mg by mouth 3 (three) times  daily as needed for nausea or vomiting.   Past Month at Unknown time  . polyethylene glycol (MIRALAX / GLYCOLAX) packet Take 25.5 g by mouth at bedtime.   Past Week at Unknown time  . simvastatin (ZOCOR) 40 MG tablet Take 40 mg by mouth daily at 6 PM. (1700)   02/11/2021 at Unknown time  . valACYclovir (VALTREX) 1000 MG tablet Take 1,000 mg by mouth 3 (three) times daily as needed (flares).    02/11/2021 at Unknown time  . zolpidem (AMBIEN) 5 MG tablet Take 2.5 mg by mouth at bedtime as needed for sleep.  5 Past Month at Unknown time   Allergies:  Allergies  Allergen Reactions  . Metronidazole Nausea And Vomiting and Dermatitis    States previously had n/v to this  med then had a rxt with "burning skin all over". Was on this for 10 years d/t diverticulosis    . Oxycodone-Acetaminophen Nausea And Vomiting  . Sulfa Antibiotics Nausea And Vomiting  . Amitriptyline Other (See Comments)    Dizziness. Felt drunk Was tried for pain.  . Amlodipine Swelling    Ankle swelling  . Ampicillin Nausea And Vomiting    Has patient had a PCN reaction causing immediate rash, facial/tongue/throat swelling, SOB or lightheadedness with hypotension: No Has patient had a PCN reaction causing severe rash involving mucus membranes or skin necrosis: No Has patient had a PCN reaction that required hospitalization: No Has patient had a PCN reaction occurring within the last 10 years: No If all of the above answers are "NO", then may proceed with Cephalosporin use..   . Cephalexin Nausea And Vomiting  . Ciprofloxacin Dermatitis    Pt states felt like "burning under her skin all over"   . Citalopram Other (See Comments)    Difficulty sleeping, shakes, headaches, sick to her stomach  . Clarithromycin Nausea And Vomiting  . Codeine Nausea And Vomiting and Other (See Comments)  . Crestor [Rosuvastatin] Other (See Comments)    Hair loss  . Hydrochlorothiazide Nausea And Vomiting  . Hydrocodone-Acetaminophen Nausea And Vomiting    When taking with zofran,she has no problems with this  . Lisinopril Other (See Comments) and Cough    headaches  . Macrobid [Nitrofurantoin] Other (See Comments)    Burning sensation in legs  . Simvastatin Other (See Comments)    Causes "stomach burning" Currently on this medication and is doing okay if she eats food first    Family History  Problem Relation Age of Onset  . Heart disease Father        heart attack  . Diabetes Father   . Cancer Paternal Grandfather        prostate   Social History:  reports that she has never smoked. She has never used smokeless tobacco. She reports that she does not drink alcohol and does not use  drugs.  ROS: A complete review of systems was performed.  All systems are negative except for pertinent findings as noted. ROS   Physical Exam:  Vital signs in last 24 hours: Temp:  [98.5 F (36.9 C)] 98.5 F (36.9 C) (05/25 1126) Pulse Rate:  [61] 61 (05/25 1126) Resp:  [16] 16 (05/25 1126) BP: (138)/(80) 138/80 (05/25 1126) SpO2:  [100 %] 100 % (05/25 1126) Weight:  [77.6 kg] 77.6 kg (05/25 1126) General:  Alert and oriented, No acute distress HEENT: Normocephalic, atraumatic Neck: No JVD or lymphadenopathy Cardiovascular: Regular rate and rhythm Lungs: Regular rate and effort Abdomen: Soft, nontender, nondistended,  no abdominal masses Back: No CVA tenderness Extremities: No edema Neurologic: Grossly intact  Laboratory Data:  No results found for this or any previous visit (from the past 24 hour(s)). Recent Results (from the past 240 hour(s))  SARS CORONAVIRUS 2 (TAT 6-24 HRS) Nasopharyngeal Nasopharyngeal Swab     Status: None   Collection Time: 02/10/21  2:42 PM   Specimen: Nasopharyngeal Swab  Result Value Ref Range Status   SARS Coronavirus 2 NEGATIVE NEGATIVE Final    Comment: (NOTE) SARS-CoV-2 target nucleic acids are NOT DETECTED.  The SARS-CoV-2 RNA is generally detectable in upper and lower respiratory specimens during the acute phase of infection. Negative results do not preclude SARS-CoV-2 infection, do not rule out co-infections with other pathogens, and should not be used as the sole basis for treatment or other patient management decisions. Negative results must be combined with clinical observations, patient history, and epidemiological information. The expected result is Negative.  Fact Sheet for Patients: SugarRoll.be  Fact Sheet for Healthcare Providers: https://www.woods-mathews.com/  This test is not yet approved or cleared by the [place] FDA and  has been authorized for detection and/or  diagnosis of SARS-CoV-2 by FDA under an Emergency Use Authorization (EUA). This EUA will remain  in effect (meaning this test can be used) for the duration of the COVID-19 declaration under Se ction 564(b)(1) of the Act, 21 U.S.C. section 360bbb-3(b)(1), unless the authorization is terminated or revoked sooner.  Performed at [facility], [place] [address], [place], [place] [address]    Creatinine: No results for input(s): CREATININE in the last 168 hours.  Impression/Assessment:  Recurrent diverticulitis History of low-grade bladder cancer  Plan:  Proceed with cystoscopy with bilateral firefly instillation and bilateral temporary ureteral catheterization.  Marton Redwood, III 02/12/2021, 1:32 PM

## 2021-02-12 NOTE — Progress Notes (Signed)
Received patient from PACU, still sleepy from surgery and unable to answer all admission questions or hold thermometer probe under tongue, VS obtained, call light placed in reach

## 2021-02-13 ENCOUNTER — Encounter (HOSPITAL_COMMUNITY): Payer: Self-pay | Admitting: Surgery

## 2021-02-13 ENCOUNTER — Other Ambulatory Visit: Payer: Self-pay

## 2021-02-13 LAB — BASIC METABOLIC PANEL
Anion gap: 12 (ref 5–15)
BUN: 12 mg/dL (ref 8–23)
CO2: 25 mmol/L (ref 22–32)
Calcium: 8.7 mg/dL — ABNORMAL LOW (ref 8.9–10.3)
Chloride: 101 mmol/L (ref 98–111)
Creatinine, Ser: 0.76 mg/dL (ref 0.44–1.00)
GFR, Estimated: >60 mL/min (ref >60)
Glucose, Bld: 146 mg/dL — ABNORMAL HIGH (ref 70–99)
Potassium: 4 mmol/L (ref 3.5–5.1)
Sodium: 138 mmol/L (ref 135–145)

## 2021-02-13 LAB — CBC
HCT: 35 % — ABNORMAL LOW (ref 36.0–46.0)
Hemoglobin: 12 g/dL (ref 12.0–15.0)
MCH: 31.7 pg (ref 26.0–34.0)
MCHC: 34.3 g/dL (ref 30.0–36.0)
MCV: 92.3 fL (ref 80.0–100.0)
Platelets: 173 10*3/uL (ref 150–400)
RBC: 3.79 MIL/uL — ABNORMAL LOW (ref 3.87–5.11)
RDW: 12 % (ref 11.5–15.5)
WBC: 10.6 10*3/uL — ABNORMAL HIGH (ref 4.0–10.5)
nRBC: 0 % (ref 0.0–0.2)

## 2021-02-13 MED ORDER — ACETAMINOPHEN 325 MG PO TABS
650.0000 mg | ORAL_TABLET | Freq: Four times a day (QID) | ORAL | Status: DC
  Filled 2021-02-13 (×2): qty 2

## 2021-02-13 NOTE — Discharge Instructions (Signed)
POST OP INSTRUCTIONS AFTER COLON SURGERY  1. DIET: Be sure to include lots of fluids daily to stay hydrated - 64oz of water per day (8, 8 oz glasses).  Avoid fast food or heavy meals for the first couple of weeks as your are more likely to get nauseated. Avoid raw/uncooked fruits or vegetables for the first 4 weeks (its ok to have these if they are blended into smoothie form). If you have fruits/vegetables, make sure they are cooked until soft enough to mash on the roof of your mouth and chew your food well. Otherwise, diet as tolerated.  2. Take your usually prescribed home medications unless otherwise directed.  3. PAIN CONTROL: a. Pain is best controlled by a usual combination of three different methods TOGETHER: i. Ice/Heat ii. Over the counter pain medication iii. Prescription pain medication b. Most patients will experience some swelling and bruising around the surgical site.  Ice packs or heating pads (30-60 minutes up to 6 times a day) will help. Some people prefer to use ice alone, heat alone, alternating between ice & heat.  Experiment to what works for you.  Swelling and bruising can take several weeks to resolve.   c. It is helpful to take an over-the-counter pain medication regularly for the first few weeks: i. Ibuprofen (Motrin/Advil) - 200mg  tabs - take 3 tabs (600mg ) every 6 hours as needed for pain (unless you have been directed previously to avoid NSAIDs/ibuprofen) ii. Acetaminophen (Tylenol) - you may take 650mg  every 6 hours as needed. You can take this with motrin as they act differently on the body. If you are taking a narcotic pain medication that has acetaminophen in it, do not take over the counter tylenol at the same time. iii. NOTE: You may take both of these medications together - most patients  find it most helpful when alternating between the two (i.e. Ibuprofen at 6am, tylenol at 9am, ibuprofen at 12pm ...) d. A  prescription for pain medication should be given to you  upon discharge.  Take your pain medication as prescribed if your pain is not adequatly controlled with the over-the-counter pain reliefs mentioned above.  4. Avoid getting constipated.  Between the surgery and the pain medications, it is common to experience some constipation.  Increasing fluid intake and taking a fiber supplement (such as Metamucil, Citrucel, FiberCon, MiraLax, etc) 1-2 times a day regularly will usually help prevent this problem from occurring.  A mild laxative (prune juice, Milk of Magnesia, MiraLax, etc) should be taken according to package directions if there are no bowel movements after 48 hours.    5. Dressing: Your incisions are covered in Dermabond which is like sterile superglue for the skin. This will come off on it's own in a couple weeks. It is waterproof and you may bathe normally in a shower - with soap and water over your wounds. Avoid baths/pools/lakes/oceans until your wounds have fully healed. If you were discharged with a dressing, this may be removed the day after you arrive home.  6. ACTIVITIES as tolerated:   a. Avoid heavy lifting (>10lbs or 1 gallon of milk) for the next 6 weeks. b. You may resume regular daily activities as tolerated--such as daily self-care, walking, climbing stairs--gradually increasing activities as tolerated.  If you can walk 30 minutes without difficulty, it is safe to try more intense activity such as jogging, treadmill, bicycling, low-impact aerobics.  c. DO NOT PUSH THROUGH PAIN.  Let pain be your guide: If it hurts to  do something, don't do it. d. Dennis Bast may drive when you are no longer taking prescription pain medication, you can comfortably wear a seatbelt, and you can safely maneuver your car and apply brakes.  7. FOLLOW UP in our office a. Please call [facility] at [phone] to set up an appointment to see your surgeon in the office for a follow-up appointment approximately 2 weeks after your surgery. b. Make sure that you call for  this appointment the day you arrive home to insure a convenient appointment time.  9. If you have disability or family leave forms that need to be completed, you may have them completed by your primary care physician's office; for return to work instructions, please ask our office staff and they will be happy to assist you in obtaining this documentation   When to call us [phone]: 1. Poor pain control 2. Reactions / problems with new medications (rash/itching, etc)  3. Fever over 101.5 F (38.5 C) 4. Inability to urinate 5. Nausea/vomiting 6. Worsening swelling or bruising 7. Continued bleeding from incision. 8. Increased pain, redness, or drainage from the incision  The clinic staff is available to answer your questions during regular business hours (8:30am-5pm).  Please don't hesitate to call and ask to speak to one of our nurses for clinical concerns.   A surgeon from [facility] is always on call at the hospitals   If you have a medical emergency, go to the nearest emergency room or call 911.  [facility], [place] [address] [place], [place], [place]  [address] MAIN: [phone] FAX: [phone] [email or web address]

## 2021-02-13 NOTE — Progress Notes (Signed)
Subjective No acute events. Some bloody BMs. Had nausea after tramadol last night. That said, denies any significant abdominal pain. Tolerating liquids this morning without issue.  Objective: Vital signs in last 24 hours: Temp:  [96 F (35.6 C)-98.3 F (36.8 C)] 97.7 F (36.5 C) (05/26 1050) Pulse Rate:  [64-86] 69 (05/26 1050) Resp:  [10-18] 18 (05/26 1050) BP: (118-162)/(61-80) 118/61 (05/26 1050) SpO2:  [94 %-100 %] 98 % (05/26 1050) Weight:  [73.8 kg-74.3 kg] 74.3 kg (05/26 0549) Last BM Date: 02/13/21  Intake/Output from previous day: 05/25 0701 - 05/26 0700 In: 3494.7 [P.O.:220; I.V.:3116.5; IV Piggyback:158.3] Out: 800 [Urine:750; Blood:50] Intake/Output this shift: Total I/O In: 240 [P.O.:240] Out: 300 [Urine:300]  Gen: NAD, comfortable CV: RRR Pulm: Normal work of breathing Abd: Soft, nondistended; incisions c/d/i without drainage; minimal incisional tenderness Ext: SCDs in place  Lab Results: CBC  Recent Labs    02/13/21 0433  WBC 10.6*  HGB 12.0  HCT 35.0*  PLT 173   BMET Recent Labs    02/13/21 0433  NA 138  K 4.0  CL 101  CO2 25  GLUCOSE 146*  BUN 12  CREATININE 0.76  CALCIUM 8.7*   PT/INR No results for input(s): LABPROT, INR in the last 72 hours. ABG No results for input(s): PHART, HCO3 in the last 72 hours.  Invalid input(s): PCO2, PO2  Studies/Results:  Anti-infectives: Anti-infectives (From admission, onward)   Start     Dose/Rate Route Frequency Ordered Stop   02/12/21 1115  clindamycin (CLEOCIN) IVPB 900 mg        900 mg 100 mL/hr over 30 Minutes Intravenous On call to O.R. 02/12/21 1102 02/12/21 1345   02/12/21 0600  gentamicin (GARAMYCIN) 330 mg in dextrose 5 % 100 mL IVPB        5 mg/kg  66.6 kg (Adjusted) 108.3 mL/hr over 60 Minutes Intravenous On call to O.R. 02/11/21 0755 02/12/21 1437       Assessment/Plan: Patient Active Problem List   Diagnosis Date Noted  . S/P laparoscopic-assisted sigmoidectomy 02/12/2021   . Varicose veins of both lower extremities with pain 02/26/2019  . Chest pain, rule out acute myocardial infarction 09/01/2017  . HLD (hyperlipidemia) 05/04/2017  . Rectal bleed 05/04/2017  . Colovesical fistula 05/04/2017  . GI bleed 05/18/2015  . Bleeding internal hemorrhoids 01/25/2013  . Abdominal pain, acute, left lower quadrant 04/18/2012  . Intra-abdominal abscess ([place]) 01/06/2012  . Diverticulitis 01/05/2012  . HTN (hypertension) 01/05/2012   s/p Procedure(s): ROBOTIC ASSISTED SIGMOIDECTOMY WITH TAP BLOCK FLEXIBLE SIGMOIDOSCOPY CYSTOSCOPY WITH FIREFLY INJECTION AND URETERAL CATHETERS 02/12/2021  -Clinically doing well -We spent time reviewing her procedure, findings and plans moving forward -Discussed she may be advanced on diet as tolerated -Continue foley today as still somewhat bloody given ureteral stents yesterday -SCDs; hold heparin today given what she describes to be fairly bloody/bright red BMs  -Ambulate 5x/day    LOS: 1 day   Nadeen Landau, MD [facility] Use [email or web address] to contact on call provider

## 2021-02-14 LAB — BASIC METABOLIC PANEL
Anion gap: 8 (ref 5–15)
BUN: 9 mg/dL (ref 8–23)
CO2: 26 mmol/L (ref 22–32)
Calcium: 8.6 mg/dL — ABNORMAL LOW (ref 8.9–10.3)
Chloride: 103 mmol/L (ref 98–111)
Creatinine, Ser: 0.59 mg/dL (ref 0.44–1.00)
GFR, Estimated: >60 mL/min (ref >60)
Glucose, Bld: 119 mg/dL — ABNORMAL HIGH (ref 70–99)
Potassium: 3.6 mmol/L (ref 3.5–5.1)
Sodium: 137 mmol/L (ref 135–145)

## 2021-02-14 LAB — CBC
HCT: 35 % — ABNORMAL LOW (ref 36.0–46.0)
Hemoglobin: 12 g/dL (ref 12.0–15.0)
MCH: 31.6 pg (ref 26.0–34.0)
MCHC: 34.3 g/dL (ref 30.0–36.0)
MCV: 92.1 fL (ref 80.0–100.0)
Platelets: 152 10*3/uL (ref 150–400)
RBC: 3.8 MIL/uL — ABNORMAL LOW (ref 3.87–5.11)
RDW: 12.4 % (ref 11.5–15.5)
WBC: 11.6 10*3/uL — ABNORMAL HIGH (ref 4.0–10.5)
nRBC: 0 % (ref 0.0–0.2)

## 2021-02-14 MED ORDER — PSYLLIUM 95 % PO PACK
1.0000 | PACK | Freq: Every day | ORAL | Status: DC
  Administered 2021-02-15: 1 via ORAL
  Filled 2021-02-14 (×2): qty 1

## 2021-02-14 MED ORDER — HEPARIN SODIUM (PORCINE) 5000 UNIT/ML IJ SOLN
5000.0000 [IU] | Freq: Three times a day (TID) | INTRAMUSCULAR | Status: DC
  Filled 2021-02-14: qty 1

## 2021-02-14 MED ORDER — PSYLLIUM 95 % PO PACK
1.0000 | PACK | Freq: Every day | ORAL | Status: DC
  Filled 2021-02-14: qty 1

## 2021-02-14 NOTE — Progress Notes (Signed)
Pt to transfer to 1321. Report called to Charlyne Petrin RN. Pt informed of transfer

## 2021-02-14 NOTE — Progress Notes (Signed)
Subjective No acute events. Continues to have bowel fxn - no further blood in stool; multiple BMs - occasionally with some stool leakage. No nausea/vomiting. Pain well controlled. Up walking around. Tolerating diet  Objective: Vital signs in last 24 hours: Temp:  [97.7 F (36.5 C)-99.5 F (37.5 C)] 99.5 F (37.5 C) (05/27 0409) Pulse Rate:  [65-75] 71 (05/27 0409) Resp:  [14-20] 14 (05/27 0409) BP: (118-154)/(61-73) 154/73 (05/27 0409) SpO2:  [93 %-98 %] 93 % (05/27 0409) Weight:  [73.5 kg] 73.5 kg (05/27 0409) Last BM Date: 02/13/21  Intake/Output from previous day: 05/26 0701 - 05/27 0700 In: 1676.9 [P.O.:1020; I.V.:656.9] Out: 2300 [Urine:2300] Intake/Output this shift: No intake/output data recorded.  Gen: NAD, comfortable CV: RRR Pulm: Normal work of breathing Abd: Soft, nondistended; incisions c/d/i without drainage; minimal incisional tenderness Ext: SCDs in place  Lab Results: CBC  Recent Labs    02/13/21 0433 02/14/21 0652  WBC 10.6* 11.6*  HGB 12.0 12.0  HCT 35.0* 35.0*  PLT 173 152   BMET Recent Labs    02/13/21 0433 02/14/21 0652  NA 138 137  K 4.0 3.6  CL 101 103  CO2 25 26  GLUCOSE 146* 119*  BUN 12 9  CREATININE 0.76 0.59  CALCIUM 8.7* 8.6*   PT/INR No results for input(s): LABPROT, INR in the last 72 hours. ABG No results for input(s): PHART, HCO3 in the last 72 hours.  Invalid input(s): PCO2, PO2  Studies/Results:  Anti-infectives: Anti-infectives (From admission, onward)   Start     Dose/Rate Route Frequency Ordered Stop   02/12/21 1115  clindamycin (CLEOCIN) IVPB 900 mg        900 mg 100 mL/hr over 30 Minutes Intravenous On call to O.R. 02/12/21 1102 02/12/21 1345   02/12/21 0600  gentamicin (GARAMYCIN) 330 mg in dextrose 5 % 100 mL IVPB        5 mg/kg  66.6 kg (Adjusted) 108.3 mL/hr over 60 Minutes Intravenous On call to O.R. 02/11/21 0755 02/12/21 1437       Assessment/Plan: Patient Active Problem List   Diagnosis  Date Noted  . S/P laparoscopic-assisted sigmoidectomy 02/12/2021  . Varicose veins of both lower extremities with pain 02/26/2019  . Chest pain, rule out acute myocardial infarction 09/01/2017  . HLD (hyperlipidemia) 05/04/2017  . Rectal bleed 05/04/2017  . Colovesical fistula 05/04/2017  . GI bleed 05/18/2015  . Bleeding internal hemorrhoids 01/25/2013  . Abdominal pain, acute, left lower quadrant 04/18/2012  . Intra-abdominal abscess ([place]) 01/06/2012  . Diverticulitis 01/05/2012  . HTN (hypertension) 01/05/2012   s/p Procedure(s): ROBOTIC ASSISTED SIGMOIDECTOMY WITH TAP BLOCK FLEXIBLE SIGMOIDOSCOPY CYSTOSCOPY WITH FIREFLY INJECTION AND URETERAL CATHETERS 02/12/2021  -Clinically continues to do well -D/C foley -Metamucil to help bulk -Possible discharge tomorrow if doing well -Ambulate 5x/day  -PPx: SQH, SCDs   LOS: 2 days   Nadeen Landau, MD [facility] Use [email or web address] to contact on call provider

## 2021-02-14 NOTE — Plan of Care (Signed)
  Problem: Education: Goal: Knowledge of General Education information will improve Description: Including pain rating scale, medication(s)/side effects and non-pharmacologic comfort measures Outcome: Progressing   Problem: Health Behavior/Discharge Planning: Goal: Ability to manage health-related needs will improve Outcome: Progressing   Problem: Clinical Measurements: Goal: Ability to maintain clinical measurements within normal limits will improve Outcome: Progressing Goal: Will remain free from infection Outcome: Progressing Goal: Cardiovascular complication will be avoided Outcome: Progressing   Problem: Activity: Goal: Risk for activity intolerance will decrease Outcome: Progressing   Problem: Nutrition: Goal: Adequate nutrition will be maintained Outcome: Progressing   Problem: Coping: Goal: Level of anxiety will decrease Outcome: Progressing   Problem: Elimination: Goal: Will not experience complications related to bowel motility Outcome: Progressing   Problem: Pain Managment: Goal: General experience of comfort will improve Outcome: Progressing   Problem: Safety: Goal: Ability to remain free from injury will improve Outcome: Progressing

## 2021-02-15 LAB — BASIC METABOLIC PANEL
Anion gap: 7 (ref 5–15)
BUN: 10 mg/dL (ref 8–23)
CO2: 30 mmol/L (ref 22–32)
Calcium: 8.5 mg/dL — ABNORMAL LOW (ref 8.9–10.3)
Chloride: 103 mmol/L (ref 98–111)
Creatinine, Ser: 0.57 mg/dL (ref 0.44–1.00)
GFR, Estimated: >60 mL/min (ref >60)
Glucose, Bld: 104 mg/dL — ABNORMAL HIGH (ref 70–99)
Potassium: 3.2 mmol/L — ABNORMAL LOW (ref 3.5–5.1)
Sodium: 140 mmol/L (ref 135–145)

## 2021-02-15 LAB — CBC
HCT: 35.1 % — ABNORMAL LOW (ref 36.0–46.0)
Hemoglobin: 11.9 g/dL — ABNORMAL LOW (ref 12.0–15.0)
MCH: 31.6 pg (ref 26.0–34.0)
MCHC: 33.9 g/dL (ref 30.0–36.0)
MCV: 93.4 fL (ref 80.0–100.0)
Platelets: 148 10*3/uL — ABNORMAL LOW (ref 150–400)
RBC: 3.76 MIL/uL — ABNORMAL LOW (ref 3.87–5.11)
RDW: 12.4 % (ref 11.5–15.5)
WBC: 8.2 10*3/uL (ref 4.0–10.5)
nRBC: 0 % (ref 0.0–0.2)

## 2021-02-15 MED ORDER — HYDROCODONE-ACETAMINOPHEN 5-325 MG PO TABS: 1.0000 | ORAL_TABLET | Freq: Four times a day (QID) | ORAL | 0 refills | Status: DC | PRN

## 2021-02-15 NOTE — Plan of Care (Signed)
Patient dc'd all care plans met  

## 2021-02-15 NOTE — Discharge Summary (Signed)
Physician Discharge Summary  Patient ID: Gloria Bell MRN: [id number] DOB/AGE: 06-19-1951 70 y.o.  Admit date: 02/12/2021 Discharge date: 02/15/2021  Admission Diagnoses:diverticulitis   Discharge Diagnoses:  Active Problems:   S/P laparoscopic-assisted sigmoidectomy   Discharged Condition: good  Hospital Course: Patient did well.  Her bowel function returned quickly.  She was having some leakage of stool liquid stool.  She was ambulating.  Pain control was good.  Incisions are clean dry intact and she was discharged home on postoperative day 3 in good condition.  Consults: None    Treatments: surgery: robotic sigmoid colectomy  Discharge Exam: Blood pressure (!) 151/82, pulse (!) 59, temperature 98.3 F (36.8 C), temperature source Oral, resp. rate 14, height 5\' 6"  (1.676 m), weight 73.5 kg, SpO2 95 %. General appearance: alert and cooperative Resp: clear to auscultation bilaterally Cardio: regular rate and rhythm, S1, S2 normal, no murmur, click, rub or gallop Incision/Wound:CDI   Disposition: Discharge disposition: 01-Home or Self Care       Discharge Instructions    Diet - low sodium heart healthy   Complete by: As directed    Increase activity slowly   Complete by: As directed      Allergies as of 02/15/2021      Reactions   Metronidazole Nausea And Vomiting, Dermatitis   States previously had n/v to this med then had a rxt with "burning skin all over". Was on this for 10 years d/t diverticulosis   Oxycodone-acetaminophen Nausea And Vomiting   Sulfa Antibiotics Nausea And Vomiting   Amitriptyline Other (See Comments)   Dizziness. Felt drunk Was tried for pain.   Amlodipine Swelling   Ankle swelling   Ampicillin Nausea And Vomiting   Has patient had a PCN reaction causing immediate rash, facial/tongue/throat swelling, SOB or lightheadedness with hypotension: No Has patient had a PCN reaction causing severe rash involving mucus membranes or skin  necrosis: No Has patient had a PCN reaction that required hospitalization: No Has patient had a PCN reaction occurring within the last 10 years: No If all of the above answers are "NO", then may proceed with Cephalosporin use..   Cephalexin Nausea And Vomiting   Ciprofloxacin Dermatitis   Pt states felt like "burning under her skin all over"   Citalopram Other (See Comments)   Difficulty sleeping, shakes, headaches, sick to her stomach   Clarithromycin Nausea And Vomiting   Codeine Nausea And Vomiting, Other (See Comments)   Crestor [rosuvastatin] Other (See Comments)   Hair loss   Hydrochlorothiazide Nausea And Vomiting   Hydrocodone-acetaminophen Nausea And Vomiting   When taking with zofran,she has no problems with this   Lisinopril Other (See Comments), Cough   headaches   Macrobid [nitrofurantoin] Other (See Comments)   Burning sensation in legs   Simvastatin Other (See Comments)   Causes "stomach burning" Currently on this medication and is doing okay if she eats food first      Medication List    STOP taking these medications   neomycin 500 MG tablet Commonly known as: MYCIFRADIN   polyethylene glycol 17 g packet Commonly known as: MIRALAX / GLYCOLAX     TAKE these medications   candesartan 16 MG tablet Commonly known as: ATACAND Take 16 mg by mouth 2 (two) times daily. (0900 & 1700)   HYDROcodone-acetaminophen 5-325 MG tablet Commonly known as: NORCO/VICODIN Take 1 tablet by mouth every 6 (six) hours as needed for moderate pain.   levothyroxine 50 MCG tablet Commonly known as:  SYNTHROID Take 50 mcg by mouth in the morning.   metoprolol succinate 100 MG 24 hr tablet Commonly known as: TOPROL-XL Take 100 mg by mouth 2 (two) times daily. (0900 & 1700)   multivitamin with minerals Tabs tablet Take 1 tablet by mouth daily with lunch.   ondansetron 4 MG tablet Commonly known as: ZOFRAN Take 4 mg by mouth 3 (three) times daily as needed for nausea or  vomiting.   simvastatin 40 MG tablet Commonly known as: ZOCOR Take 40 mg by mouth daily at 6 PM. (1700)   valACYclovir 1000 MG tablet Commonly known as: VALTREX Take 1,000 mg by mouth 3 (three) times daily as needed (flares).   zolpidem 5 MG tablet Commonly known as: AMBIEN Take 2.5 mg by mouth at bedtime as needed for sleep.       Follow-up Information    Ileana Roup, MD Follow up in 2 week(s).   Specialties: General Surgery, Colon and Rectal Surgery Why: 2-3 weeks for postop appointment Contact information: [place] [address] [phone]               Signed: Joyice Faster Marke Goodwyn 02/15/2021, 10:32 AM

## 2021-02-18 LAB — SURGICAL PATHOLOGY

## 2021-04-08 DIAGNOSIS — U071 COVID-19: Secondary | ICD-10-CM | POA: Diagnosis not present

## 2021-05-16 DIAGNOSIS — R933 Abnormal findings on diagnostic imaging of other parts of digestive tract: Secondary | ICD-10-CM | POA: Diagnosis not present

## 2021-05-16 DIAGNOSIS — K648 Other hemorrhoids: Secondary | ICD-10-CM | POA: Diagnosis not present

## 2021-05-16 DIAGNOSIS — D125 Benign neoplasm of sigmoid colon: Secondary | ICD-10-CM | POA: Diagnosis not present

## 2021-05-16 DIAGNOSIS — Z980 Intestinal bypass and anastomosis status: Secondary | ICD-10-CM | POA: Diagnosis not present

## 2021-05-16 DIAGNOSIS — K5730 Diverticulosis of large intestine without perforation or abscess without bleeding: Secondary | ICD-10-CM | POA: Diagnosis not present

## 2021-05-16 DIAGNOSIS — D120 Benign neoplasm of cecum: Secondary | ICD-10-CM | POA: Diagnosis not present

## 2021-05-16 DIAGNOSIS — K644 Residual hemorrhoidal skin tags: Secondary | ICD-10-CM | POA: Diagnosis not present

## 2021-05-16 DIAGNOSIS — D123 Benign neoplasm of transverse colon: Secondary | ICD-10-CM | POA: Diagnosis not present

## 2021-06-24 DIAGNOSIS — Z79899 Other long term (current) drug therapy: Secondary | ICD-10-CM | POA: Diagnosis not present

## 2021-06-24 DIAGNOSIS — E039 Hypothyroidism, unspecified: Secondary | ICD-10-CM | POA: Diagnosis not present

## 2021-06-24 DIAGNOSIS — I10 Essential (primary) hypertension: Secondary | ICD-10-CM | POA: Diagnosis not present

## 2021-06-24 DIAGNOSIS — E7800 Pure hypercholesterolemia, unspecified: Secondary | ICD-10-CM | POA: Diagnosis not present

## 2021-06-24 DIAGNOSIS — G4700 Insomnia, unspecified: Secondary | ICD-10-CM | POA: Diagnosis not present

## 2021-06-24 DIAGNOSIS — Z23 Encounter for immunization: Secondary | ICD-10-CM | POA: Diagnosis not present

## 2021-06-25 ENCOUNTER — Other Ambulatory Visit: Payer: Self-pay | Admitting: Urology

## 2021-06-26 ENCOUNTER — Other Ambulatory Visit: Payer: Self-pay | Admitting: Urology

## 2021-07-08 ENCOUNTER — Other Ambulatory Visit: Payer: BC Managed Care – PPO | Admitting: Urology

## 2021-07-21 NOTE — Progress Notes (Incomplete)
   07/21/21  CC: No chief complaint on file.    HPI: Gloria Bell is a 70 y.o.female with a personal history of bladder cancer, who returns today for annual cystoscopy.    1 cm low-grade Ta TCC status post TURBT on 02/2017.         There were no vitals filed for this visit. NED. A&Ox3.   No respiratory distress   Abd soft, NT, ND Normal external genitalia with patent urethral meatus  Cystoscopy Procedure Note  Patient identification was confirmed, informed consent was obtained, and patient was prepped using Betadine solution.  Lidocaine jelly was administered per urethral meatus.    Procedure: - Flexible cystoscope introduced, without any difficulty.   - Thorough search of the bladder revealed:    normal urethral meatus    normal urothelium    no stones    no ulcers     no tumors    no urethral polyps    no trabeculation  - Ureteral orifices were normal in position and appearance.  Post-Procedure: - Patient tolerated the procedure well  Assessment/ Plan: History of bladder cancer   No follow-ups on file.  I,Kailey Littlejohn,acting as a Education administrator for Hollice Espy, MD.,have documented all relevant documentation on the behalf of Hollice Espy, MD,as directed by  Hollice Espy, MD while in the presence of Hollice Espy, MD.

## 2021-07-22 ENCOUNTER — Other Ambulatory Visit: Payer: BC Managed Care – PPO | Admitting: Urology

## 2021-07-22 ENCOUNTER — Telehealth: Payer: Self-pay | Admitting: Urology

## 2021-07-22 NOTE — Telephone Encounter (Signed)
Patient advised, rescheduled cysto.

## 2021-07-22 NOTE — Telephone Encounter (Signed)
Pt called today to cancel her cysto.  She is having issues with her bowels.  She wants to know if there is any other options besides a cysto.  She is embarrassed.

## 2021-07-22 NOTE — Telephone Encounter (Signed)
Please reschedule.  No other options for surveillance.    Hollice Espy, MD

## 2021-07-29 NOTE — Progress Notes (Signed)
   07/30/21  CC:  Chief Complaint  Patient presents with   Cysto     HPI: Gloria Bell is a 70 y.o.female with a personal history of bladder cancer, who presents today for annual surveillance cystoscopy.   Surgical pathology consistent with 1 cm low-grade Ta TCC status post TURBT on 02/2017.   Never smoker.  She does have a long history of working in the [employer] but no known exposure to aniline dyes.  Patient denies any new changed since the last visit in 06/2020.     Vitals:   07/30/21 1533  BP: (!) 171/91  Pulse: (!) 59  NED. A&Ox3.   No respiratory distress   Abd soft, NT, ND Normal external genitalia with patent urethral meatus  Cystoscopy Procedure Note  Patient identification was confirmed, informed consent was obtained, and patient was prepped using Betadine solution.  Lidocaine jelly was administered per urethral meatus.    Procedure: - Flexible cystoscope introduced, without any difficulty.   - Thorough search of the bladder revealed:    normal urethral meatus with stellate scare on the left posterior bladder wall    normal urothelium    no stones    no ulcers     no tumors    no urethral polyps    no trabeculation  - Ureteral orifices were normal in position and appearance.  Post-Procedure: - Patient tolerated the procedure well  Assessment/ Plan:  History of bladder cancer - History of 1 cm low-grade Ta TCC s/p TURBT on 02/2017.  - NED today.  - Continue annual cystoscopy.   Return in 1 year for cystoscopy   I,Kailey Littlejohn,acting as a scribe for Hollice Espy, MD.,have documented all relevant documentation on the behalf of Hollice Espy, MD,as directed by  Hollice Espy, MD while in the presence of Hollice Espy, MD.  I have reviewed the above documentation for accuracy and completeness, and I agree with the above.   Hollice Espy, MD

## 2021-07-30 ENCOUNTER — Other Ambulatory Visit: Payer: Self-pay

## 2021-07-30 ENCOUNTER — Ambulatory Visit (INDEPENDENT_AMBULATORY_CARE_PROVIDER_SITE_OTHER): Payer: BC Managed Care – PPO | Admitting: Urology

## 2021-07-30 VITALS — BP 171/91 | HR 59

## 2021-07-30 DIAGNOSIS — Z8551 Personal history of malignant neoplasm of bladder: Secondary | ICD-10-CM | POA: Diagnosis not present

## 2021-08-01 LAB — URINALYSIS, COMPLETE
Bilirubin, UA: NEGATIVE
Glucose, UA: NEGATIVE
Ketones, UA: NEGATIVE
Leukocytes,UA: NEGATIVE
Nitrite, UA: NEGATIVE
Protein,UA: NEGATIVE
RBC, UA: NEGATIVE
Specific Gravity, UA: >1.03 — ABNORMAL HIGH (ref 1.005–1.030)
Urobilinogen, Ur: 0.2 mg/dL (ref 0.2–1.0)
pH, UA: 5 (ref 5.0–7.5)

## 2021-08-01 LAB — MICROSCOPIC EXAMINATION
Bacteria, UA: NONE SEEN
RBC, Urine: NONE SEEN /hpf (ref 0–2)

## 2021-11-19 DIAGNOSIS — Z8719 Personal history of other diseases of the digestive system: Secondary | ICD-10-CM | POA: Diagnosis not present

## 2021-11-25 DIAGNOSIS — E039 Hypothyroidism, unspecified: Secondary | ICD-10-CM | POA: Diagnosis not present

## 2021-11-25 DIAGNOSIS — I10 Essential (primary) hypertension: Secondary | ICD-10-CM | POA: Diagnosis not present

## 2021-11-25 DIAGNOSIS — Z0000 Encounter for general adult medical examination without abnormal findings: Secondary | ICD-10-CM | POA: Diagnosis not present

## 2021-11-25 DIAGNOSIS — E7800 Pure hypercholesterolemia, unspecified: Secondary | ICD-10-CM | POA: Diagnosis not present

## 2021-11-25 DIAGNOSIS — Z1331 Encounter for screening for depression: Secondary | ICD-10-CM | POA: Diagnosis not present

## 2022-02-17 DIAGNOSIS — I10 Essential (primary) hypertension: Secondary | ICD-10-CM | POA: Diagnosis not present

## 2022-03-09 DIAGNOSIS — Z1231 Encounter for screening mammogram for malignant neoplasm of breast: Secondary | ICD-10-CM | POA: Diagnosis not present

## 2022-03-31 DIAGNOSIS — K649 Unspecified hemorrhoids: Secondary | ICD-10-CM | POA: Diagnosis not present

## 2022-03-31 DIAGNOSIS — K56699 Other intestinal obstruction unspecified as to partial versus complete obstruction: Secondary | ICD-10-CM | POA: Diagnosis not present

## 2022-04-03 DIAGNOSIS — N952 Postmenopausal atrophic vaginitis: Secondary | ICD-10-CM | POA: Diagnosis not present

## 2022-04-27 DIAGNOSIS — Z6825 Body mass index (BMI) 25.0-25.9, adult: Secondary | ICD-10-CM | POA: Diagnosis not present

## 2022-04-27 DIAGNOSIS — R6889 Other general symptoms and signs: Secondary | ICD-10-CM | POA: Diagnosis not present

## 2022-04-27 DIAGNOSIS — J111 Influenza due to unidentified influenza virus with other respiratory manifestations: Secondary | ICD-10-CM | POA: Diagnosis not present

## 2022-04-27 DIAGNOSIS — Z9109 Other allergy status, other than to drugs and biological substances: Secondary | ICD-10-CM | POA: Diagnosis not present

## 2022-05-01 ENCOUNTER — Encounter: Payer: Medicare Other | Admitting: Obstetrics and Gynecology

## 2022-05-11 ENCOUNTER — Encounter: Payer: Medicare Other | Admitting: Obstetrics & Gynecology

## 2022-05-18 ENCOUNTER — Ambulatory Visit (INDEPENDENT_AMBULATORY_CARE_PROVIDER_SITE_OTHER): Payer: BC Managed Care – PPO | Admitting: Obstetrics & Gynecology

## 2022-05-18 ENCOUNTER — Encounter: Payer: Self-pay | Admitting: Obstetrics & Gynecology

## 2022-05-18 VITALS — BP 134/82 | Ht 66.0 in | Wt 147.6 lb

## 2022-05-18 DIAGNOSIS — N905 Atrophy of vulva: Secondary | ICD-10-CM | POA: Diagnosis not present

## 2022-05-18 NOTE — Progress Notes (Signed)
   Subjective:    Patient ID: Gloria Bell, female    DOB: 11-26-1950, 71 y.o.   MRN: [id number]  HPI  71 yo married P1 is here because she looked at her vulva recently and thought that she saw black tissue. She denies any pain or bleeding. She has been abstinent for 20 years (due to husband's ED). She had a hysterectomy in the distant past.  She was very concerned because she has a h/o bladder cancer which was found incidentally during a CT for diverticulitis. She is up to date with her bladder screening cystoscopies.  She does have cataracts.  Review of Systems     Objective:   Physical Exam Well nourished, well hydrated White female, no apparent distress She is ambulating and conversing normally. Her vulva has expected atrophy. I took a photo for her chart and also showed her the photo so that she could be reassured that it is not black.  I did a bimanual exam and felt nothing other than a small mobile uterus. No tenderness appreciated.       Assessment & Plan:  Vulvovaginal atrophy- asymptomatic  I have reassured her that her gyn organs are fine. She will come back as necessary.

## 2022-06-29 DIAGNOSIS — E039 Hypothyroidism, unspecified: Secondary | ICD-10-CM | POA: Diagnosis not present

## 2022-06-29 DIAGNOSIS — I10 Essential (primary) hypertension: Secondary | ICD-10-CM | POA: Diagnosis not present

## 2022-06-29 DIAGNOSIS — R7303 Prediabetes: Secondary | ICD-10-CM | POA: Diagnosis not present

## 2022-06-29 DIAGNOSIS — Z23 Encounter for immunization: Secondary | ICD-10-CM | POA: Diagnosis not present

## 2022-06-29 DIAGNOSIS — E7800 Pure hypercholesterolemia, unspecified: Secondary | ICD-10-CM | POA: Diagnosis not present

## 2022-07-02 ENCOUNTER — Telehealth: Payer: Self-pay

## 2022-07-02 NOTE — Patient Outreach (Signed)
  Care Coordination   Initial Visit Note   07/02/2022 Name: Gloria Bell MRN: [id number] DOB: [birth year]  Gloria Bell Gloria Bell is a 71 y.o. year old female who sees Lam, Rudi Rummage, NP (Inactive) for primary care. I spoke with  Gloria Bell by phone today.  What matters to the patients health and wellness today?  Placed call to patient to explain and offer [facility] care coordination program. Patient reports that she is doing well and denies any needs at this time.   SDOH assessments and interventions completed:  No     Care Coordination Interventions Activated:  No  Care Coordination Interventions:  No, not indicated   Follow up plan: No further intervention required.   Encounter Outcome:  Pt. Refused   Tomasa Rand, RN, BSN, CEN [facility] [employer] [phone]

## 2022-08-04 ENCOUNTER — Other Ambulatory Visit: Payer: BC Managed Care – PPO | Admitting: Urology

## 2022-08-19 ENCOUNTER — Other Ambulatory Visit: Payer: BC Managed Care – PPO | Admitting: Urology

## 2022-08-26 ENCOUNTER — Encounter: Payer: Self-pay | Admitting: Urology

## 2022-08-26 ENCOUNTER — Ambulatory Visit: Payer: BC Managed Care – PPO | Admitting: Urology

## 2022-08-26 VITALS — BP 147/84 | HR 65 | Ht 66.0 in | Wt 147.0 lb

## 2022-08-26 DIAGNOSIS — Z8551 Personal history of malignant neoplasm of bladder: Secondary | ICD-10-CM

## 2022-08-26 DIAGNOSIS — R3129 Other microscopic hematuria: Secondary | ICD-10-CM

## 2022-08-26 NOTE — Progress Notes (Signed)
   08/26/22  CC:  Chief Complaint  Patient presents with   Cysto     HPI: Gloria Bell is a 71 y.o.female with a personal history of bladder cancer, who presents today for annual surveillance cystoscopy.   Surgical pathology consistent with 1 cm low-grade Ta TCC status post TURBT on 02/2017.   Never smoker.  She does have a long history of working in the [employer] but no known exposure to aniline dyes.  She does mention today that she is a different soap, Gilda Crease and its irritated her externally.  She does have microscopic blood in her urine, 11-30 red blood cells per high-powered field in the absence of infection today.  NED. A&Ox3.   No respiratory distress   Abd soft, NT, ND Normal external genitalia with patent urethral meatus  Cystoscopy Procedure Note  Patient identification was confirmed, informed consent was obtained, and patient was prepped using Betadine solution.  Lidocaine jelly was administered per urethral meatus.    Procedure: - Flexible cystoscope introduced, without any difficulty.   - Thorough search of the bladder revealed:    normal urethral meatus with stellate scare on the posterior bladder wall    normal urothelium    no stones    no ulcers     no tumors    no urethral polyps    no trabeculation  - Ureteral orifices were normal in position and appearance.  Post-Procedure: - Patient tolerated the procedure well  Assessment/ Plan:  History of bladder cancer - History of 1 cm low-grade Ta TCC s/p TURBT on 02/2017.  - NED today.  - Continue annual cystoscopy.   2.  Microscopic hematuria Presence of microscopic blood in her urine today, given her history of bladder cancer, recommend CT urogram.  She is agreeable this plan would like to try to get it before the end of the year due to an insurance switch.  Will try her best to accommodate this.  Will call her with these results.  Return in 1 year for cystoscopy    Hollice Espy, MD

## 2022-08-27 LAB — MICROSCOPIC EXAMINATION

## 2022-08-27 LAB — URINALYSIS, COMPLETE
Bilirubin, UA: NEGATIVE
Glucose, UA: NEGATIVE
Ketones, UA: NEGATIVE
Nitrite, UA: NEGATIVE
Specific Gravity, UA: 1.025 (ref 1.005–1.030)
Urobilinogen, Ur: 0.2 mg/dL (ref 0.2–1.0)
pH, UA: 6 (ref 5.0–7.5)

## 2022-09-08 ENCOUNTER — Ambulatory Visit
Admission: RE | Admit: 2022-09-08 | Discharge: 2022-09-08 | Disposition: A | Discharge status: Home / Self Care | Payer: BC Managed Care – PPO | Source: Ambulatory Visit | Attending: Urology | Admitting: Urology

## 2022-09-08 DIAGNOSIS — R3129 Other microscopic hematuria: Secondary | ICD-10-CM | POA: Insufficient documentation

## 2022-09-08 DIAGNOSIS — Z8551 Personal history of malignant neoplasm of bladder: Secondary | ICD-10-CM | POA: Insufficient documentation

## 2022-09-08 DIAGNOSIS — K5730 Diverticulosis of large intestine without perforation or abscess without bleeding: Secondary | ICD-10-CM | POA: Diagnosis not present

## 2022-09-08 LAB — POCT I-STAT CREATININE: Creatinine, Ser: 0.8 mg/dL (ref 0.44–1.00)

## 2022-09-08 MED ORDER — IOHEXOL 300 MG/ML  SOLN
100.0000 mL | Freq: Once | INTRAMUSCULAR | Status: AC | PRN
  Administered 2022-09-08: 100 mL via INTRAVENOUS

## 2022-09-22 ENCOUNTER — Telehealth: Payer: Self-pay | Admitting: *Deleted

## 2022-09-22 NOTE — Telephone Encounter (Signed)
CT is normal, report available on MyChart.  Hollice Espy, MD

## 2022-09-22 NOTE — Telephone Encounter (Signed)
Patient called in today asking about her ct results that was done on 09/08/2022.

## 2022-09-23 NOTE — Telephone Encounter (Signed)
Pt called and I read the message to pt from Dr Erlene Quan that the Ct was normal.

## 2022-09-23 NOTE — Telephone Encounter (Signed)
Left message to call back  

## 2022-12-02 ENCOUNTER — Telehealth: Payer: Self-pay

## 2022-12-02 NOTE — Patient Outreach (Signed)
  Care Coordination   Initial Visit Note   12/02/2022 Name: Aamina Skiff MRN: [id number] DOB: Jul 08, 1951  Sunny Schlein Wilfred Curtis is a 72 y.o. year old female who sees Hamrick, Lorin Mercy, MD for primary care. I spoke with  Haskell Riling by phone today.  What matters to the patients health and wellness today?  Placed call to patient to review and explain [facility] care coordination program. Patient reports that she is doing well and denies any needs.     SDOH assessments and interventions completed:  No     Care Coordination Interventions:  No, not indicated   Follow up plan: No further intervention required.   Encounter Outcome:  Pt. Refused   Tomasa Rand, RN, BSN, CEN [facility] [employer] [phone]

## 2023-08-31 ENCOUNTER — Other Ambulatory Visit: Payer: BC Managed Care – PPO | Admitting: Urology

## 2023-10-13 ENCOUNTER — Other Ambulatory Visit: Payer: BC Managed Care – PPO | Admitting: Urology

## 2023-11-21 ENCOUNTER — Emergency Department

## 2023-11-21 ENCOUNTER — Other Ambulatory Visit: Payer: Self-pay

## 2023-11-21 ENCOUNTER — Emergency Department
Admission: EM | Admit: 2023-11-21 | Discharge: 2023-11-21 | Disposition: A | Discharge status: Home / Self Care | Attending: Emergency Medicine | Admitting: Emergency Medicine

## 2023-11-21 DIAGNOSIS — W19XXXA Unspecified fall, initial encounter: Secondary | ICD-10-CM | POA: Diagnosis not present

## 2023-11-21 DIAGNOSIS — H9191 Unspecified hearing loss, right ear: Secondary | ICD-10-CM | POA: Insufficient documentation

## 2023-11-21 DIAGNOSIS — I129 Hypertensive chronic kidney disease with stage 1 through stage 4 chronic kidney disease, or unspecified chronic kidney disease: Secondary | ICD-10-CM | POA: Insufficient documentation

## 2023-11-21 DIAGNOSIS — N189 Chronic kidney disease, unspecified: Secondary | ICD-10-CM | POA: Insufficient documentation

## 2023-11-21 DIAGNOSIS — S0990XA Unspecified injury of head, initial encounter: Secondary | ICD-10-CM | POA: Insufficient documentation

## 2023-11-21 DIAGNOSIS — S40012A Contusion of left shoulder, initial encounter: Secondary | ICD-10-CM | POA: Diagnosis not present

## 2023-11-21 DIAGNOSIS — S7001XA Contusion of right hip, initial encounter: Secondary | ICD-10-CM | POA: Diagnosis not present
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
## 2023-12-15 ENCOUNTER — Other Ambulatory Visit: Payer: Self-pay | Admitting: Urology

## 2024-02-01 ENCOUNTER — Other Ambulatory Visit: Payer: Self-pay | Admitting: Urology

## 2024-02-29 ENCOUNTER — Ambulatory Visit: Payer: Self-pay | Admitting: Urology

## 2024-02-29 VITALS — BP 167/94 | HR 59 | Ht 65.0 in | Wt 158.0 lb

## 2024-02-29 DIAGNOSIS — Z8551 Personal history of malignant neoplasm of bladder: Secondary | ICD-10-CM | POA: Diagnosis not present

## 2024-02-29 LAB — URINALYSIS, COMPLETE
Bilirubin, UA: NEGATIVE
Glucose, UA: NEGATIVE
Ketones, UA: NEGATIVE
Leukocytes,UA: NEGATIVE
Nitrite, UA: NEGATIVE
Protein,UA: NEGATIVE
RBC, UA: NEGATIVE
Specific Gravity, UA: 1.03 (ref 1.005–1.030)
Urobilinogen, Ur: 0.2 mg/dL (ref 0.2–1.0)
pH, UA: 6 (ref 5.0–7.5)

## 2024-02-29 LAB — MICROSCOPIC EXAMINATION

## 2024-02-29 NOTE — Progress Notes (Unsigned)
   02/29/24  CC:  Chief Complaint  Patient presents with   Cysto     HPI: Gloria Bell is a 73 y.o.female with a personal history of bladder cancer, who presents today for annual surveillance cystoscopy.   Surgical pathology consistent with 1 cm low-grade Ta TCC status post TURBT on 02/2017.   Never smoker.  She does have a long history of working in the [employer] but no known exposure to aniline dyes.  She does mention today that she is a different soap, Patrici Boom and its irritated her externally.  She does have microscopic blood in her urine, 11-30 red blood cells per high-powered field in the absence of infection today.  NED. A&Ox3.   No respiratory distress   Abd soft, NT, ND Normal external genitalia with patent urethral meatus  Cystoscopy Procedure Note  Patient identification was confirmed, informed consent was obtained, and patient was prepped using Betadine  solution.  Lidocaine  jelly was administered per urethral meatus.    Procedure: - Flexible cystoscope introduced, without any difficulty.   - Thorough search of the bladder revealed:    normal urethral meatus with stellate scare on the posterior bladder wall    normal urothelium    no stones    no ulcers     no tumors    no urethral polyps    no trabeculation  - Ureteral orifices were normal in position and appearance.  Post-Procedure: - Patient tolerated the procedure well  Assessment/ Plan:  History of bladder cancer - History of 1 cm low-grade Ta TCC s/p TURBT on 02/2017.  - NED today.  - Continue annual cystoscopy.   2.  Microscopic hematuria Presence of microscopic blood in her urine today, given her history of bladder cancer, recommend CT urogram.  She is agreeable this plan would like to try to get it before the end of the year due to an insurance switch.  Will try her best to accommodate this.  Will call her with these results.  Return in 1 year for cystoscopy    Dustin Gimenez, MD

## 2024-05-15 NOTE — Progress Notes (Signed)
 Established Patient Visit   Chief Complaint: Coronary artery disease hypertension Chief Complaint  Patient presents with  . Follow-up    1 year follow up.     Date of Service: 05/15/2024 Date of Birth: 05/31/1951 PCP: Gloria Charlene CROME, MD  History of Present Illness: Gloria Bell is a 73 y.o.female patient who presented for follow-up coronary artery disease, hypertension  Past medical history significant for coronary artery disease -CTA coronaries 2019 with mild nonobstructive CAD, hypertension, hyperlipidemia, varicose vein, bladder cancer.  Echocardiogram 10/2017 with normal biventricular systolic function, no significant valvular abnormality.  Today patient details that in last several months she has intermittent symptoms of sharp left-sided chest discomfort that can last from few seconds to a minute.  Also has intermittent midsternal chest discomfort which last for few minutes, can happen at rest or with activity.  Has intermittent cough which is worse on lying.  No increased shortness of breath.  No palpitation, dizziness or syncope.  Patient denied EKG.  Past Medical and Surgical History  Past Medical History Past Medical History:  Diagnosis Date  . Bladder cancer (CMS/HHS-HCC)   . Cataracts, bilateral   . Chronic constipation   . Degenerative arthritis   . Diverticulitis   . Fall    with traumatic injury to elbows and knees secondary to  . Hemorrhoids   . Hyperlipidemia   . Hypertension   . Kidney stones    status post surgical removal  . Narcotic dependence (CMS/HHS-HCC)    prior, due to injury  . Osteoporosis     Past Surgical History She has a past surgical history that includes Cholecystectomy; Kidney stone removal; Abdominal adhesions (1997); Hemorrhoid Banding; Right Elbow Arthroscopy 11/27/2003 Dr Kathi; Knee arthroscopy (Right, 02/04/2004); Knee arthroscopy (Left, 05/05/2004); Knee arthroscopy (Bilateral, 2003); Knee arthroscopy (Left, 1995); Hysterectomy; Lap BSO  (1991); and Transurethral Resection of Bladder Tumor (TURBT)  (02/2017).   Medications and Allergies  Current Medications  Current Outpatient Medications  Medication Sig Dispense Refill  . acetaminophen  (TYLENOL ) 650 MG ER tablet Take 1,300 mg by mouth as needed for Pain    . candesartan (ATACAND) 16 MG tablet take 1 tablet by mouth twice a day 180 tablet 3  . levothyroxine  (SYNTHROID ) 50 MCG tablet Take 1 tablet (50 mcg) daily. Take 1.5 tablets (75 mcg) on Sunday. Take on an empty stomach with a glass of water  at least 30-60 minutes before breakfast. 32 tablet 0  . metoprolol  succinate (TOPROL -XL) 100 MG XL tablet take 1 tablet by mouth twice a day 180 tablet 3  . multivitamin capsule Take 1 capsule by mouth once daily    . ondansetron  (ZOFRAN ) 4 MG tablet     . polyethylene glycol (MIRALAX ) powder Take 17 g by mouth 2 (two) times daily Mix in 4-8ounces of fluid prior to taking.    . simvastatin  (ZOCOR ) 20 MG tablet Take 1 tablet (20 mg total) by mouth at bedtime 90 tablet 4  . valACYclovir (VALTREX) 1000 MG tablet     . zolpidem  (AMBIEN ) 10 mg tablet Take 10 mg by mouth at bedtime as needed     No current facility-administered medications for this visit.    Allergies: Amlodipine, Cipro  [ciprofloxacin  hcl], Amitriptyline, Amoxicillin , Ampicillin, Biaxin [clarithromycin], Codeine sulfate, Keflex [cephalexin], Metronidazole , Percocet [oxycodone -acetaminophen ], Sulfa (sulfonamide antibiotics), and Thiazides  Social and Family History  Social History  reports that she has never smoked. She has never used smokeless tobacco. She reports that she does not drink alcohol and does not use  drugs.  Family History Family History  Problem Relation Name Age of Onset  . Diabetes Father    . Coronary Artery Disease (Blocked arteries around heart) Father    . Myocardial Infarction (Heart attack) Father    . Hyperlipidemia (Elevated cholesterol) Other         Siblings  . High blood pressure  (Hypertension) Other         Siblings    Review of Systems   Review of Systems:  Atypical chest discomfort  Physical Examination   Vitals:BP (!) 150/88   Pulse 64   Ht 165.1 cm (5' 5)   Wt 71.2 kg (157 lb)   SpO2 95%   BMI 26.13 kg/m  Ht:165.1 cm (5' 5) Wt:71.2 kg (157 lb) ADJ:Anib surface area is 1.81 meters squared. Body mass index is 26.13 kg/m.  HEENT: Pupils equally reactive to light and accomodation  Neck: Supple, no significant JVD Lungs: clear to auscultation bilaterally; no wheezes, rales, rhonchi Heart: Regular rate and rhythm. No murmur Extremities: no pedal edema  Assessment and Plan   73 y.o. female with  Atypical chest discomfort Coronary artery disease as noted on coronary CTA 2019 Hypertension Hyperlipidemia  Will have nuclear stress test for further evaluation, to look for any significant ischemia. Will check echocardiogram to assess cardiac/valvular structure and function. Blood pressure elevated today, but usually well-controlled at home per patient. Continue candesartan and metoprolol .  She will monitor blood pressure and bring readings in follow-up. Continue statin Follow-up after above evaluation  Orders Placed This Encounter  Procedures  . NM myocardial perfusion SPECT multiple (stress and rest)  . ECG stress test only  . Echo complete    Return in about 3 weeks (around 06/05/2024).  KRISHNA CHAITANYA ALLURI, MD  This dictation was prepared with dragon dictation. Any transcription errors that result from this process are unintentional.

## 2024-05-24 ENCOUNTER — Encounter: Payer: Self-pay | Admitting: Urology

## 2024-06-27 ENCOUNTER — Emergency Department

## 2024-06-27 ENCOUNTER — Emergency Department: Admission: EM | Admit: 2024-06-27 | Discharge: 2024-06-27 | Disposition: A | Discharge status: Home / Self Care

## 2024-06-27 ENCOUNTER — Other Ambulatory Visit: Payer: Self-pay

## 2024-06-27 DIAGNOSIS — R0789 Other chest pain: Secondary | ICD-10-CM | POA: Diagnosis not present

## 2024-06-27 DIAGNOSIS — I2510 Atherosclerotic heart disease of native coronary artery without angina pectoris: Secondary | ICD-10-CM | POA: Diagnosis not present

## 2024-06-27 DIAGNOSIS — Z8551 Personal history of malignant neoplasm of bladder: Secondary | ICD-10-CM | POA: Diagnosis not present

## 2024-06-27 DIAGNOSIS — R079 Chest pain, unspecified: Secondary | ICD-10-CM | POA: Diagnosis present

## 2024-06-27 DIAGNOSIS — I10 Essential (primary) hypertension: Secondary | ICD-10-CM | POA: Diagnosis not present

## 2024-06-27 LAB — BASIC METABOLIC PANEL WITH GFR
Anion gap: 8 (ref 5–15)
BUN: 21 mg/dL (ref 8–23)
CO2: 24 mmol/L (ref 22–32)
Calcium: 8.7 mg/dL — ABNORMAL LOW (ref 8.9–10.3)
Chloride: 105 mmol/L (ref 98–111)
Creatinine, Ser: 0.7 mg/dL (ref 0.44–1.00)
GFR, Estimated: >60 mL/min (ref >60)
Glucose, Bld: 111 mg/dL — ABNORMAL HIGH (ref 70–99)
Potassium: 3.9 mmol/L (ref 3.5–5.1)
Sodium: 137 mmol/L (ref 135–145)

## 2024-06-27 LAB — CBC
HCT: 39.5 % (ref 36.0–46.0)
Hemoglobin: 13.6 g/dL (ref 12.0–15.0)
MCH: 31.8 pg (ref 26.0–34.0)
MCHC: 34.4 g/dL (ref 30.0–36.0)
MCV: 92.3 fL (ref 80.0–100.0)
Platelets: 193 K/uL (ref 150–400)
RBC: 4.28 MIL/uL (ref 3.87–5.11)
RDW: 12.3 % (ref 11.5–15.5)
WBC: 6.2 K/uL (ref 4.0–10.5)
nRBC: 0 % (ref 0.0–0.2)

## 2024-06-27 LAB — TROPONIN I (HIGH SENSITIVITY)
Troponin I (High Sensitivity): 5 ng/L (ref <18)
Troponin I (High Sensitivity): 6 ng/L (ref <18)

## 2024-06-27 LAB — D-DIMER, QUANTITATIVE: D-Dimer, Quant: 1.26 ug{FEU}/mL — ABNORMAL HIGH (ref 0.00–0.50)

## 2024-06-27 MED ORDER — HYDRALAZINE HCL 10 MG PO TABS
10.0000 mg | ORAL_TABLET | Freq: Once | ORAL | Status: AC
  Administered 2024-06-27: 10 mg via ORAL
  Filled 2024-06-27: qty 1

## 2024-06-27 MED ORDER — NITROGLYCERIN 0.4 MG SL SUBL
0.4000 mg | SUBLINGUAL_TABLET | SUBLINGUAL | Status: DC | PRN
  Administered 2024-06-27 (×2): 0.4 mg via SUBLINGUAL
  Filled 2024-06-27: qty 1

## 2024-06-27 MED ORDER — ASPIRIN 81 MG PO CHEW: 324.0000 mg | CHEWABLE_TABLET | Freq: Once | ORAL | Status: DC

## 2024-06-27 MED ORDER — IOHEXOL 350 MG/ML SOLN
75.0000 mL | Freq: Once | INTRAVENOUS | Status: AC | PRN
  Administered 2024-06-27: 75 mL via INTRAVENOUS

## 2024-06-27 MED ORDER — CLOPIDOGREL BISULFATE 75 MG PO TABS
300.0000 mg | ORAL_TABLET | Freq: Once | ORAL | Status: AC
  Administered 2024-06-27: 300 mg via ORAL
  Filled 2024-06-27: qty 4

## 2024-06-27 NOTE — ED Triage Notes (Signed)
 Patient states left sided chest pain that radiates to back, started this morning. Also reports episodes of diaphoresis this morning with exertion.

## 2024-06-27 NOTE — ED Notes (Addendum)
 Patient stated to RN that she felt 4 sharp shooting pains to the left side of her chest/breast area. EDP notifed. Repeat EKG performed and EDP notified its in chart.

## 2024-06-27 NOTE — ED Provider Notes (Signed)
 [facility] Provider Note    Event Date/Time   First MD Initiated Contact with Patient 06/27/24 1005     (approximate)   History   Chest Pain   HPI  Gloria Bell is a 73 y.o. female with past medical history of CAD, hypertension, hyperlipidemia, previous history of bladder cancer who presents to the emergency department with 3 weeks of intermittent chest pain acutely worsened this morning.  Pain is described as constant radiates to the back.  There is associated shortness of breath.  Denies any abdominal pain, changes in urinary or bowel habits.  Denies any cough or fevers.  She was seen by her cardiologist Dr. Gustavo on 05/15/2024 and has an echocardiogram ordered for tomorrow and a stress test later this week.      Physical Exam   Triage Vital Signs: ED Triage Vitals  Encounter Vitals Group     BP 06/27/24 0958 (!) 162/99     Girls Systolic BP Percentile --      Girls Diastolic BP Percentile --      Boys Systolic BP Percentile --      Boys Diastolic BP Percentile --      Pulse Rate 06/27/24 0958 60     Resp 06/27/24 0958 18     Temp 06/27/24 0958 98.2 F (36.8 C)     Temp Source 06/27/24 0958 Oral     SpO2 06/27/24 0958 95 %     Weight 06/27/24 0956 150 lb (68 kg)     Height 06/27/24 0956 5' 5 (1.651 m)     Head Circumference --      Peak Flow --      Pain Score 06/27/24 0954 8     Pain Loc --      Pain Education --      Exclude from Growth Chart --     Most recent vital signs: Vitals:   06/27/24 1257 06/27/24 1317  BP: (!) 176/89 (!) 181/86  Pulse: (!) 58   Resp: 14   Temp:    SpO2: 100%     Nursing Triage Note reviewed. Vital signs reviewed and patients oxygen saturation is normoxic  General: Patient is well nourished, well developed, awake and alert, resting comfortably in no acute distress Head: Normocephalic and atraumatic Eyes: Normal inspection, extraocular muscles intact, no conjunctival pallor Ear, nose, throat:  Normal external exam Neck: Normal range of motion Respiratory: Patient is in no respiratory distress, lungs CTAB Cardiovascular: Patient is not tachycardic, RRR without murmur appreciated, 2+ radial pulses GI: Abd SNT with no guarding or rebound  Back: Normal inspection of the back with good strength and range of motion throughout all ext Extremities: pulses intact with good cap refills, no LE pitting edema or calf tenderness Neuro: The patient is alert and oriented to person, place, and time, appropriately conversive, with 5/5 bilat UE/LE strength, no gross motor or sensory defects noted. Coordination appears to be adequate. Skin: Warm, dry, and intact Psych: normal mood and affect, no SI or HI  ED Results / Procedures / Treatments   Labs (all labs ordered are listed, but only abnormal results are displayed) Labs Reviewed  BASIC METABOLIC PANEL WITH GFR - Abnormal; Notable for the following components:      Result Value   Glucose, Bld 111 (*)    Calcium 8.7 (*)    All other components within normal limits  D-DIMER, QUANTITATIVE - Abnormal; Notable for the following components:   D-Dimer,  Quant 1.26 (*)    All other components within normal limits  CBC  TROPONIN I (HIGH SENSITIVITY)  TROPONIN I (HIGH SENSITIVITY)     EKG EKG and rhythm strip are interpreted by myself:   EKG: [Normal sinus rhythm] at heart rate of 56, normal QRS duration, QTc 407, nonspecific ST segments and T waves no ectopy EKG not consistent with Acute STEMI Rhythm strip: NSR in lead II   RADIOLOGY CXR: No acute abnormality on my independent review interpretation radiologist agrees CTA PE: No pulmonary embolism, scattered pulmonary nodules which patient was aware of.    PROCEDURES:  Critical Care performed: No  Procedures   MEDICATIONS ORDERED IN ED: Medications  nitroGLYCERIN  (NITROSTAT ) SL tablet 0.4 mg (0.4 mg Sublingual Not Given 06/27/24 1113)  clopidogrel (PLAVIX) tablet 300 mg (300 mg  Oral Given 06/27/24 1052)  hydrALAZINE  (APRESOLINE ) tablet 10 mg (10 mg Oral Given 06/27/24 1317)  iohexol  (OMNIPAQUE ) 350 MG/ML injection 75 mL (75 mLs Intravenous Contrast Given 06/27/24 1345)     IMPRESSION / MDM / ASSESSMENT AND PLAN / ED COURSE                                Differential diagnosis includes, but is not limited to, ACS, GERD, PE, pneumonia, electrolyte derangement   ED course: Patient presents and she appears well and has no focal neurological deficits.  Initial EKG demonstrated no evidence of acute ischemia.  Troponins x 2 was not elevated.  Given the RSR' seen on the EKG a D-dimer was sent which was elevated.  CT PE demonstrated no acute abnormality and a copy of her incidental findings was handed to her.  Patient still intermittently complained of chest pain and so her case was discussed with on-call cardiologist who reviewed her workup and history verbally using the telephone.  He is in agreement that patient is safe for discharge today to continue the outpatient workup with the echocardiogram and outpatient stress test.  Patient was able to ambulate and take p.o. and she feels comfortable with this.  All questions answered and patient voiced understanding and requested discharge   Clinical Course as of 06/27/24 1626  Tue Jun 27, 2024  1028 Although not initially listed along with patient's 17 allergies to medications, patient tells me she is allergic to aspirin and it breaks out in welts.  She does not have an allergy listed to Plavix although there was a sudden cross-reactivity.  Reviewed the indications and she is willing to take such.  Will initiate a loading dose of Plavix and monitor [HD]  1158 Troponin I (High Sensitivity): 5 Troponin not elevated [HD]  1158 D-Dimer, Quant(!): 1.26 Will obtain a CT PE [HD]  1301 Troponin I (High Sensitivity): 6 Not elevated [HD]  1440 73 y.o. female with  Atypical chest discomfort Coronary artery disease as noted on coronary  CTA 2019 Hypertension Hyperlipidemia  [HD]  1446 Case discussed with Dr. Ammon who reviewed the workup and we are both in agreement that patient is stable to return home if CT chest PE is unremarkable and to obtain the outpatient workup [HD]  1530 Reviewed the workup with the patient and cardiology recommendations.  She is resting comfortably.  She will keep her appointment for her echocardiogram tomorrow and her stress test later this week.  All questions answered patient voiced understanding and requested discharge [HD]    Clinical Course User Index [HD] Nicholaus Rolland BRAVO, MD  At time of discharge there is no evidence of acute life, limb, vision, or fertility threat. Patient has stable vital signs, pain is well controlled, patient is ambulatory and p.o. tolerant.  Discharge instructions were completed using the EPIC system. I would refer you to those at this time. All warnings prescriptions follow-up etc. were discussed in detail with the patient. Patient indicates understanding and is agreeable with this plan. All questions answered.  Patient is made aware that they may return to the emergency department for any worsening or new condition or for any other emergency.  -- Risk: 5 This patient has a high risk of morbidity due to further diagnostic testing or treatment. Rationale: This patient's evaluation and management involve a high risk of morbidity due to the potential severity of presenting symptoms, need for diagnostic testing, and/or initiation of treatment that may require close monitoring. The differential includes conditions with potential for significant deterioration or requiring escalation of care. Treatment decisions in the ED, including medication administration, procedural interventions, or disposition planning, reflect this level of risk. COPA: 5 The patient has the following acute or chronic illness/injury that poses a possible threat to life or bodily function: [X] : The  patient has a potentially serious acute condition or an acute exacerbation of a chronic illness requiring urgent evaluation and management in the Emergency Department. The clinical presentation necessitates immediate consideration of life-threatening or function-threatening diagnoses, even if they are ultimately ruled out.   FINAL CLINICAL IMPRESSION(S) / ED DIAGNOSES   Final diagnoses:  Chest pain, unspecified type     Rx / DC Orders   ED Discharge Orders     None        Note:  This document was prepared using Dragon voice recognition software and may include unintentional dictation errors.   Nicholaus Rolland BRAVO, MD 06/27/24 [phone]

## 2024-06-27 NOTE — Discharge Instructions (Signed)
 Continue your regular medications.  Keep your appointment tomorrow for your echocardiogram.  Return with any acutely worsening symptoms or any other emergency.  It was very nice meeting you and I wish you the best of luck. -- RETURN PRECAUTIONS & AFTERCARE: (ENGLISH) RETURN PRECAUTIONS: Return immediately to the emergency department or see/call your doctor if you feel worse, weak or have changes in speech or vision, are short of breath, have fever, vomiting, pain, bleeding or dark stool, trouble urinating or any new issues. Return here or see/call your doctor if not improving as expected for your suspected condition. FOLLOW-UP CARE: Call your doctor and/or any doctors we referred you to for more advice and to make an appointment. Do this today, tomorrow or after the weekend. Some doctors only take PPO insurance so if you have HMO insurance you may want to contact your HMO or your regular doctor for referral to a specialist within your plan. Either way tell the doctor's office that it was a referral from the emergency department so you get the soonest possible appointment.  YOUR TEST RESULTS: Take result reports of any blood or urine tests, imaging tests and EKG's to your doctor and any referral doctor. Have any abnormal tests repeated. Your doctor or a referral doctor can let you know when this should be done. Also make sure your doctor contacts this hospital to get any test results that are not currently available such as cultures or special tests for infection and final imaging reports, which are often not available at the time you leave the ER but which may list additional important findings that are not documented on the preliminary report. BLOOD PRESSURE: If your blood pressure was greater than 120/80 have your blood pressure rechecked within 1 to 2 weeks. MEDICATION SIDE EFFECTS: Do not drive, walk, bike, take the bus, etc. if you have received or are being prescribed any sedating medications such as  those for pain or anxiety or certain antihistamines like Benadryl . If you have been give one of these here get a taxi home or have a friend drive you home. Ask your pharmacist to counsel you on potential side effects of any new medication

## 2024-07-25 ENCOUNTER — Other Ambulatory Visit: Payer: Self-pay | Admitting: Cardiology

## 2024-07-25 DIAGNOSIS — I10 Essential (primary) hypertension: Secondary | ICD-10-CM

## 2024-07-25 DIAGNOSIS — R0789 Other chest pain: Secondary | ICD-10-CM

## 2024-07-25 DIAGNOSIS — E782 Mixed hyperlipidemia: Secondary | ICD-10-CM

## 2024-07-25 DIAGNOSIS — I2510 Atherosclerotic heart disease of native coronary artery without angina pectoris: Secondary | ICD-10-CM

## 2024-08-09 ENCOUNTER — Telehealth (HOSPITAL_COMMUNITY): Payer: Self-pay | Admitting: *Deleted

## 2024-08-09 NOTE — Telephone Encounter (Signed)
 Reaching out to patient to offer assistance regarding upcoming cardiac imaging study; pt verbalizes understanding of appt date/time, parking situation and where to check in, pre-test NPO status and medications ordered, and verified current allergies; name and call back number provided for further questions should they arise Sid Seats RN Navigator Cardiac Imaging [facility] [phone] office [phone] cell

## 2024-08-10 ENCOUNTER — Ambulatory Visit
Admission: RE | Admit: 2024-08-10 | Discharge: 2024-08-10 | Disposition: A | Discharge status: Home / Self Care | Source: Ambulatory Visit | Attending: Cardiology | Admitting: Cardiology

## 2024-08-10 DIAGNOSIS — I2584 Coronary atherosclerosis due to calcified coronary lesion: Secondary | ICD-10-CM | POA: Insufficient documentation

## 2024-08-10 DIAGNOSIS — I2510 Atherosclerotic heart disease of native coronary artery without angina pectoris: Secondary | ICD-10-CM | POA: Diagnosis present

## 2024-08-10 DIAGNOSIS — R0789 Other chest pain: Secondary | ICD-10-CM | POA: Diagnosis present

## 2024-08-10 DIAGNOSIS — I10 Essential (primary) hypertension: Secondary | ICD-10-CM | POA: Diagnosis present

## 2024-08-10 DIAGNOSIS — E782 Mixed hyperlipidemia: Secondary | ICD-10-CM | POA: Insufficient documentation

## 2024-08-10 MED ORDER — IOHEXOL 350 MG/ML SOLN
100.0000 mL | Freq: Once | INTRAVENOUS | Status: AC | PRN
Start: 2024-08-10 — End: 2024-08-10
  Administered 2024-08-10: 100 mL via INTRAVENOUS

## 2024-08-10 MED ORDER — METOPROLOL TARTRATE 5 MG/5ML IV SOLN
10.0000 mg | Freq: Once | INTRAVENOUS | Status: DC | PRN
  Filled 2024-08-10: qty 10

## 2024-08-10 MED ORDER — DILTIAZEM HCL 25 MG/5ML IV SOLN
10.0000 mg | INTRAVENOUS | Status: DC | PRN
  Filled 2024-08-10: qty 5

## 2024-08-10 MED ORDER — NITROGLYCERIN 0.4 MG SL SUBL
0.8000 mg | SUBLINGUAL_TABLET | Freq: Once | SUBLINGUAL | Status: AC
  Administered 2024-08-10: 0.8 mg via SUBLINGUAL
  Filled 2024-08-10: qty 25

## 2024-08-10 NOTE — Progress Notes (Signed)
 Patient tolerated CT well. Vitals stable. Patient encouraged to drink fluids throughout day.

## 2025-02-28 ENCOUNTER — Other Ambulatory Visit: Admitting: Urology

## 2025-03-01 ENCOUNTER — Other Ambulatory Visit: Admitting: Urology
